# Patient Record
Sex: Female | Born: 1955 | Race: White | Hispanic: No | Smoking: Former smoker
Health system: Southern US, Community
[De-identification: ages and names within clinical notes are randomized; demographics above are authoritative.]

## PROBLEM LIST (undated history)

## (undated) DIAGNOSIS — E079 Disorder of thyroid, unspecified: Secondary | ICD-10-CM

## (undated) DIAGNOSIS — Z8619 Personal history of other infectious and parasitic diseases: Secondary | ICD-10-CM

## (undated) DIAGNOSIS — F32A Depression, unspecified: Secondary | ICD-10-CM

## (undated) DIAGNOSIS — K5792 Diverticulitis of intestine, part unspecified, without perforation or abscess without bleeding: Secondary | ICD-10-CM

## (undated) DIAGNOSIS — F329 Major depressive disorder, single episode, unspecified: Secondary | ICD-10-CM

## (undated) DIAGNOSIS — N390 Urinary tract infection, site not specified: Secondary | ICD-10-CM

## (undated) DIAGNOSIS — F419 Anxiety disorder, unspecified: Secondary | ICD-10-CM

## (undated) DIAGNOSIS — K2270 Barrett's esophagus without dysplasia: Secondary | ICD-10-CM

## (undated) HISTORY — PX: TUBAL LIGATION: SHX77

## (undated) HISTORY — DX: Depression, unspecified: F32.A

## (undated) HISTORY — DX: Personal history of other infectious and parasitic diseases: Z86.19

## (undated) HISTORY — DX: Major depressive disorder, single episode, unspecified: F32.9

## (undated) HISTORY — DX: Diverticulitis of intestine, part unspecified, without perforation or abscess without bleeding: K57.92

## (undated) HISTORY — DX: Disorder of thyroid, unspecified: E07.9

## (undated) HISTORY — DX: Urinary tract infection, site not specified: N39.0

## (undated) HISTORY — DX: Anxiety disorder, unspecified: F41.9

## (undated) HISTORY — DX: Barrett's esophagus without dysplasia: K22.70

---

## 2015-09-20 LAB — HM MAMMOGRAPHY

## 2017-03-07 ENCOUNTER — Other Ambulatory Visit: Payer: Self-pay | Admitting: Physician Assistant

## 2017-03-07 DIAGNOSIS — Z1239 Encounter for other screening for malignant neoplasm of breast: Secondary | ICD-10-CM

## 2017-03-07 DIAGNOSIS — K219 Gastro-esophageal reflux disease without esophagitis: Secondary | ICD-10-CM | POA: Insufficient documentation

## 2017-03-07 DIAGNOSIS — E039 Hypothyroidism, unspecified: Secondary | ICD-10-CM | POA: Insufficient documentation

## 2018-02-06 ENCOUNTER — Other Ambulatory Visit: Payer: Self-pay

## 2018-02-06 ENCOUNTER — Encounter (INDEPENDENT_AMBULATORY_CARE_PROVIDER_SITE_OTHER): Payer: Self-pay

## 2018-02-06 ENCOUNTER — Encounter: Payer: Self-pay | Admitting: Psychiatry

## 2018-02-06 ENCOUNTER — Ambulatory Visit (INDEPENDENT_AMBULATORY_CARE_PROVIDER_SITE_OTHER): Payer: BLUE CROSS/BLUE SHIELD | Admitting: Psychiatry

## 2018-02-06 VITALS — BP 111/73 | HR 80 | Temp 99.2°F | Ht 64.96 in | Wt 161.2 lb

## 2018-02-06 DIAGNOSIS — F17200 Nicotine dependence, unspecified, uncomplicated: Secondary | ICD-10-CM

## 2018-02-06 DIAGNOSIS — F419 Anxiety disorder, unspecified: Secondary | ICD-10-CM

## 2018-02-06 DIAGNOSIS — F330 Major depressive disorder, recurrent, mild: Secondary | ICD-10-CM

## 2018-02-06 MED ORDER — ESCITALOPRAM OXALATE 5 MG PO TABS: 5.0000 mg | ORAL_TABLET | Freq: Every day | ORAL | 1 refills | Status: DC

## 2018-02-06 NOTE — Progress Notes (Signed)
Psychiatric Initial Adult Assessment   Patient Identification: Maureen Pena MRN:  [id number] Date of Evaluation:  02/06/2018 Referral Source: Maureen Cradle MD Chief Complaint:  ' I am here to establish care." Chief Complaint    Establish Care; Anxiety; Depression; Insomnia     Visit Diagnosis:    ICD-10-CM   1. MDD (major depressive disorder), recurrent episode, mild ([place]) F33.0   2. Anxiety disorder, unspecified type F41.9   3. Tobacco use disorder F17.200     History of Present Illness:  Maureen Pena is a 62 yr old Caucasian female, employed, divorced, lives in [place] who has a history of depression, hypothyroidism, presented to the clinic today to establish care.  Patient today reports that a few weeks ago she started noticing some depressive symptoms like sadness, lack of motivation, low energy, sleep problems, reduced appetite, trouble concentrating and so on.  Patient reports it lasted for a few weeks and now she feels her symptoms are getting better.  She reports she tried getting an appointment with a psychiatrist at that time however could not get in to see anyone soon and hence went to [facility] walk-in clinic.  Patient reports she was not very happy with [facility] .  She reports she was prescribed hydroxyzine as needed for anxiety symptoms and was started on Lexapro.  Patient reports she did not take the Lexapro since she was already feeling better and felt like she had the coping skills to pull herself out of her depressive episode.  Patient also reports some anxiety attacks a few weeks ago.  She reports there were times when she felt extremely anxious, restless and panicky.  She reports those symptoms would last for a few minutes .  Reports she has been taking the hydroxyzine as needed and she likes the effect of the hydroxyzine.  Patient reports she struggled with sleep a few weeks ago when she went through her depressive episode.  She however reports her sleep is  getting better.  Patient reports a past history of similar episode in 2012 when she went through the same symptoms of sadness, reduced appetite, weight loss, trouble concentrating and so on.  Patient reports at that time she was treated by a psychiatrist in [place] who gave her Lexapro 10 mg.  She was maintained on Lexapro for a year and she did well on the same.  Patient denies any suicidality.  Patient denies any perceptual disturbances.  Patient denies any homicidality.  Patient denies any manic or hypomanic symptoms.  She however reports she is at baseline and upbeat person.  Patient reports several psychosocial stressors recently.  She reports that she went through some work-related stressors, diverticulitis, financial stressors, relationship struggles with her brother and so on.  Patient reports her recent stressors may have triggered her most recent depressive episode.  She reports she and her brother have grown apart and the relationship have not been good since the past 40 years or so.  Patient however reports that few weeks ago he reached out to her in a text message.  She reports she learned that he is ill and is dying. She reports her best friend also died from the same diagnosis and that kind of brought back a lot of memories of her friend who passed away 2 years ago.  She reports she has not gotten a chance to grieve her friend's death yet since she continues to be in denial.  She reports recreational use of cannabis.  She reports she uses it once every  3 months or so.  Associated Signs/Symptoms: Depression Symptoms:  depressed mood, fatigue, difficulty concentrating, decreased appetite, (Hypo) Manic Symptoms:  denies Anxiety Symptoms:  Excessive Worry, Psychotic Symptoms:  denies PTSD Symptoms: Negative  Past Psychiatric History: Reports she was diagnosed with major depressive disorder in 2012.  She was treated by a psychiatrist in [place].  She reports she stayed on Lexapro  10 mg for 1 year at that time.  Denies any inpatient admissions.  She denies any suicide attempts.  Recently was seen at [facility] clinic when she went through her depressive episode few weeks ago.  Previous Psychotropic Medications: Yes Lexapro, hydroxyzine.  Substance Abuse History in the last 12 months:  Yes.  Recreational use of cannabis once every few months.  Consequences of Substance Abuse: Negative  Past Medical History:  Past Medical History:  Diagnosis Date  . Anxiety   . Depression     Past Surgical History:  Procedure Laterality Date  . TUBAL LIGATION      Family Psychiatric History: Patient reports her mother struggled with psychosis and depressive symptoms but she does not know what her diagnosis was.  She also reports her mother had dementia.  Her brother has depression and opioid dependence.  Family History:  Family History  Problem Relation Age of Onset  . Depression Mother   . Anxiety disorder Mother   . Schizophrenia Mother   . Paranoid behavior Mother   . Depression Brother     Social History:   Social History   Socioeconomic History  . Marital status: Divorced    Spouse name: Not on file  . Number of children: 2  . Years of education: Not on file  . Highest education level: Bachelor's degree (e.g., BA, AB, BS)  Occupational History    Comment: part time  Social Needs  . Financial resource strain: Not hard at all  . Food insecurity:    Worry: Never true    Inability: Never true  . Transportation needs:    Medical: No    Non-medical: No  Tobacco Use  . Smoking status: Current Every Day Smoker    Packs/day: 0.50    Types: Cigarettes  . Smokeless tobacco: Never Used  Substance and Sexual Activity  . Alcohol use: Not Currently    Alcohol/week: 1.0 standard drinks    Types: 1 Glasses of wine per week  . Drug use: Yes    Types: Marijuana  . Sexual activity: Not Currently  Lifestyle  . Physical activity:    Days per week: 7 days    Minutes  per session: 20 min  . Stress: To some extent  Relationships  . Social connections:    Talks on phone: More than three times a week    Gets together: Three times a week    Attends religious service: Never    Active member of club or organization: No    Attends meetings of clubs or organizations: Never    Relationship status: Divorced  Other Topics Concern  . Not on file  Social History Narrative  . Not on file    Additional Social History: Patient is divorced.  She lives in [place].  She moved to [place] from [place] a year ago.  Patient has 2 adult daughters.  She is employed as a Chief Executive Officer.  Patient reports she was raised by her parents.  She does have one brother with whom she does not have a great relationship with.  Both her parents passed away.  Allergies:  No Known Allergies  Metabolic Disorder Labs: No results found for: HGBA1C, MPG No results found for: PROLACTIN No results found for: CHOL, TRIG, HDL, CHOLHDL, VLDL, LDLCALC   Current Medications: Current Outpatient Medications  Medication Sig Dispense Refill  . hydrOXYzine (ATARAX/VISTARIL) 25 MG tablet TAKE 1 TABLET BY MOUTH AT BEDTIME AS NEEDED FOR ANXIETY  0  . levothyroxine (SYNTHROID, LEVOTHROID) 112 MCG tablet Take by mouth.    . Omega-3 1000 MG CAPS Take by mouth.    . escitalopram (LEXAPRO) 5 MG tablet Take 1 tablet (5 mg total) by mouth daily. 30 tablet 1   No current facility-administered medications for this visit.     Neurologic: Headache: No Seizure: No Paresthesias:No  Musculoskeletal: Strength & Muscle Tone: within normal limits Gait & Station: normal Patient leans: N/A  Psychiatric Specialty Exam: Review of Systems  Psychiatric/Behavioral: Positive for depression. The patient is nervous/anxious.   All other systems reviewed and are negative.   Blood pressure 111/73, pulse 80, temperature 99.2 F (37.3 C), temperature source Oral, height 5' 4.96" (1.65 m), weight 161 lb 3.2 oz  (73.1 kg).Body mass index is 26.86 kg/m.  General Appearance: Casual  Eye Contact:  Fair  Speech:  Normal Rate  Volume:  Normal  Mood:  Anxious and Dysphoric  Affect:  Appropriate  Thought Process:  Goal Directed and Descriptions of Associations: Intact  Orientation:  Full (Time, Place, and Person)  Thought Content:  Logical  Suicidal Thoughts:  No  Homicidal Thoughts:  No  Memory:  Immediate;   Fair Recent;   Fair Remote;   Fair  Judgement:  Fair  Insight:  Fair  Psychomotor Activity:  Normal  Concentration:  Concentration: Fair and Attention Span: Fair  Recall:  [employer] of Knowledge:Fair  Language: Fair  Akathisia:  No  Handed:  Right  AIMS (if indicated):  na  Assets:  Communication Skills Desire for Improvement Social Support  ADL's:  Intact  Cognition: WNL  Sleep: improving    Treatment Plan Summary:Milda is a 62 year old Caucasian female, divorced, employed, lives in [place], has a history of depression, GERD, hypothyroidism, presented to the clinic establish care.  Patient reports she had another episode of depression a few weeks ago.  She reports she is currently improving.  Patient reports it was difficult for her to find a new provider since the last time she had a depressive episode was in 2012 while she were in [place].  Patient however was restarted on medications when she went to one of the community clinics as a walk-in for an evaluation.  Patient however reports she has been noncompliant with her Lexapro even though she was asked to restart it.  Patient is currently taking hydroxyzine as needed which she reports is helpful.  Patient is biologically predisposed given her family history of mental health problems as well as her own medical problems.  She also has psychosocial stressors of work-related stress, relationship struggles, financial problems and so on.  Patient also reports she has not yet dealt with the grief of losing one of her best friends 2  years ago.  Patient denies any suicidality or homicidality at this time.  Patient is motivated to start medications as well as psychotherapy.  Plan as noted below. Medication management and Plan as noted below Plan For MDD PHQ 9 equals 10 Restart Lexapro at a low dose of 5 mg daily.  Patient reports she has supplies at home. Will refer her for psychotherapy.  For anxiety disorder unspecified  Patient reports history of anxiety attacks GAD 7 equals 12. Hydroxyzine as prescribed as needed. Lexapro 5 mg p.o. Daily.  Tobacco use disorder Provided smoking cessation counseling.  Reviewed TSH in [facility] on 03/07/2017-within normal limits.  Patient reports she will follow-up with her PMD.  Follow-up in clinic in 4 weeks or sooner if needed.  More than 50 % of the time was spent for psychoeducation and supportive psychotherapy and care coordination.  This note was generated in part or whole with voice recognition software. Voice recognition is usually quite accurate but there are transcription errors that can and very often do occur. I apologize for any typographical errors that were not detected and corrected.        Ursula Alert, MD 8/29/20195:24 PM

## 2018-02-06 NOTE — Patient Instructions (Signed)
Maureen Pena body scan meditation  Maureen Pena

## 2018-02-11 ENCOUNTER — Telehealth: Payer: Self-pay

## 2018-02-11 NOTE — Telephone Encounter (Signed)
Copied from [place] [id number]. Topic: Appointment Scheduling - New Patient >> Feb 11, 2018 10:59 AM Mylinda Latina, NT wrote: New patient has been scheduled for your office. Provider: Dr.McLean Date of Appointment: 02/17/18  Route to department's PEC pool.

## 2018-02-17 ENCOUNTER — Ambulatory Visit: Payer: BLUE CROSS/BLUE SHIELD | Admitting: Internal Medicine

## 2018-02-17 ENCOUNTER — Ambulatory Visit: Payer: BLUE CROSS/BLUE SHIELD | Admitting: Licensed Clinical Social Worker

## 2018-02-17 ENCOUNTER — Encounter: Payer: Self-pay | Admitting: Internal Medicine

## 2018-02-17 VITALS — BP 102/62 | HR 75 | Temp 98.5°F | Ht 64.96 in | Wt 158.4 lb

## 2018-02-17 DIAGNOSIS — Z1283 Encounter for screening for malignant neoplasm of skin: Secondary | ICD-10-CM

## 2018-02-17 DIAGNOSIS — L989 Disorder of the skin and subcutaneous tissue, unspecified: Secondary | ICD-10-CM

## 2018-02-17 DIAGNOSIS — Z13818 Encounter for screening for other digestive system disorders: Secondary | ICD-10-CM

## 2018-02-17 DIAGNOSIS — Z0000 Encounter for general adult medical examination without abnormal findings: Secondary | ICD-10-CM

## 2018-02-17 DIAGNOSIS — K2270 Barrett's esophagus without dysplasia: Secondary | ICD-10-CM | POA: Insufficient documentation

## 2018-02-17 DIAGNOSIS — E559 Vitamin D deficiency, unspecified: Secondary | ICD-10-CM

## 2018-02-17 DIAGNOSIS — E039 Hypothyroidism, unspecified: Secondary | ICD-10-CM | POA: Diagnosis not present

## 2018-02-17 DIAGNOSIS — F39 Unspecified mood [affective] disorder: Secondary | ICD-10-CM | POA: Insufficient documentation

## 2018-02-17 DIAGNOSIS — F329 Major depressive disorder, single episode, unspecified: Secondary | ICD-10-CM

## 2018-02-17 DIAGNOSIS — F419 Anxiety disorder, unspecified: Secondary | ICD-10-CM

## 2018-02-17 DIAGNOSIS — F330 Major depressive disorder, recurrent, mild: Secondary | ICD-10-CM | POA: Diagnosis not present

## 2018-02-17 DIAGNOSIS — E785 Hyperlipidemia, unspecified: Secondary | ICD-10-CM

## 2018-02-17 DIAGNOSIS — F32A Depression, unspecified: Secondary | ICD-10-CM | POA: Insufficient documentation

## 2018-02-17 DIAGNOSIS — Z1159 Encounter for screening for other viral diseases: Secondary | ICD-10-CM

## 2018-02-17 DIAGNOSIS — Z1389 Encounter for screening for other disorder: Secondary | ICD-10-CM

## 2018-02-17 MED ORDER — LEVOTHYROXINE SODIUM 112 MCG PO TABS: 112.0000 ug | ORAL_TABLET | Freq: Every day | ORAL | 1 refills | Status: DC
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
## 2018-02-17 NOTE — Progress Notes (Signed)
Pre visit review using our clinic review tool, if applicable. No additional management support is needed unless otherwise documented below in the visit note. 

## 2018-02-17 NOTE — Progress Notes (Addendum)
No chief complaint on file.  New patient  1. Hypothyroidism needs refill of thyroid medication levo 112  2. Anxiety and depression f/u psychiatry on lexapro 5 mg qd and atarax sees Dr. Shea Evans and therapist visit today  3. Tobacco abuse smoking 10-12 cig was 3-4 currently thinking about quitting stress is trigger  4. H/o barretts due to see GI but declines seeing [facility] for now and wants referral elsewhere  5. C/o skin lesion left shin and left cheek agreeable to see dermatology   Review of Systems  Constitutional: Negative for weight loss.  HENT: Negative for hearing loss.   Eyes: Negative for blurred vision.  Respiratory: Negative for shortness of breath.   Cardiovascular: Negative for chest pain.  Gastrointestinal: Negative for abdominal pain.  Musculoskeletal: Positive for joint pain.       B/l hip pain   Skin: Negative for rash.       +skin lesion   Neurological: Negative for headaches.  Psychiatric/Behavioral: Positive for depression. The patient is nervous/anxious.    Past Medical History:  Diagnosis Date  . Anxiety   . Depression    Past Surgical History:  Procedure Laterality Date  . TUBAL LIGATION     Family History  Problem Relation Age of Onset  . Depression Mother   . Anxiety disorder Mother   . Schizophrenia Mother   . Paranoid behavior Mother   . Depression Brother    Social History   Socioeconomic History  . Marital status: Divorced    Spouse name: Not on file  . Number of children: 2  . Years of education: Not on file  . Highest education level: Bachelor's degree (e.g., BA, AB, BS)  Occupational History    Comment: part time  Social Needs  . Financial resource strain: Not hard at all  . Food insecurity:    Worry: Never true    Inability: Never true  . Transportation needs:    Medical: No    Non-medical: No  Tobacco Use  . Smoking status: Current Every Day Smoker    Packs/day: 0.50    Types: Cigarettes  . Smokeless tobacco: Never Used    Substance and Sexual Activity  . Alcohol use: Not Currently    Alcohol/week: 1.0 standard drinks    Types: 1 Glasses of wine per week  . Drug use: Yes    Types: Marijuana  . Sexual activity: Not Currently  Lifestyle  . Physical activity:    Days per week: 7 days    Minutes per session: 20 min  . Stress: To some extent  Relationships  . Social connections:    Talks on phone: More than three times a week    Gets together: Three times a week    Attends religious service: Never    Active member of club or organization: No    Attends meetings of clubs or organizations: Never    Relationship status: Divorced  . Intimate partner violence:    Fear of current or ex partner: No    Emotionally abused: No    Physically abused: No    Forced sexual activity: No  Other Topics Concern  . Not on file  Social History Narrative  . Not on file   No outpatient medications have been marked as taking for the 02/17/18 encounter (Appointment) with McLean-Scocuzza, Nino Glow, MD.   No Known Allergies No results found for this or any previous visit (from the past 2160 hour(s)). Objective  There is no height  or weight on file to calculate BMI. Wt Readings from Last 3 Encounters:  No data found for Wt   Temp Readings from Last 3 Encounters:  No data found for Temp   BP Readings from Last 3 Encounters:  No data found for BP   Pulse Readings from Last 3 Encounters:  No data found for Pulse    Physical Exam  Constitutional: She is oriented to person, place, and time. Vital signs are normal. She appears well-developed and well-nourished. She is cooperative.  HENT:  Head: Normocephalic and atraumatic.  Mouth/Throat: Oropharynx is clear and moist and mucous membranes are normal.  Eyes: Pupils are equal, round, and reactive to light. Conjunctivae are normal.  Cardiovascular: Normal rate, regular rhythm and normal heart sounds.  Pulmonary/Chest: Effort normal and breath sounds normal.   Neurological: She is alert and oriented to person, place, and time. Gait normal.  Skin: Skin is warm and dry.     Psychiatric: She has a normal mood and affect. Her speech is normal and behavior is normal. Judgment and thought content normal. Cognition and memory are normal.  Nursing note and vitals reviewed.   Assessment   1. Anxiety/depression  F/u with Dr. Shea Evans PHQ 9 score 11 and GAD 7 score 9 today  2. Hypothyroidism  3. barretts esophagus  4. HM 5. HLD  Plan  1. Cont lexapro 5 mg qd and atarax 25 prn F/u psych and therapy    2. Refilled levo 112 today  Wanted to increase to 125 pt declines 3. Pt wants to think about GI referral disc Dr. Orson Gear  rec smoking cessation  4.  Will think about flu and pna 23 vx  Per pt had Tdap 2015 will verify (pt to bring in old records)  Disc shingrix today  Declines MMR and hep B check   Will think about mammogram at f/u need to get copy [place] requested today  Pap at f/u h/o abnormal x 1  Colonoscopy had 08/27/11 and EGD due repeat h/o barretts rec smoking cessation smoking 10-12 cig/qd no FH lung cancer never smoked more than this  5. Given cholesterol info at f/u fasting labs  provider: Dr. Olivia Mackie McLean-Scocuzza-[facility]

## 2018-02-17 NOTE — Patient Instructions (Addendum)
Think about flu shot and pneumonia vaccine  Think about Shingrix vaccine  Think about mammogram and EGD consider Dr. Jonathon Bellows or Dr. Oren Beckmann GI   Pneumococcal Vaccine, Polyvalent solution for injection What is this medicine? PNEUMOCOCCAL VACCINE, POLYVALENT (NEU mo KOK al vak SEEN, pol ee VEY luhnt) is a vaccine to prevent pneumococcus bacteria infection. These bacteria are a major cause of ear infections, Strep throat infections, and serious pneumonia, meningitis, or blood infections worldwide. These vaccines help the body to produce antibodies (protective substances) that help your body defend against these bacteria. This vaccine is recommended for people 7 years of age and older with health problems. It is also recommended for all adults over 34 years old. This vaccine will not treat an infection. This medicine may be used for other purposes; ask your health care provider or pharmacist if you have questions. COMMON BRAND NAME(S): Pneumovax 23 What should I tell my health care provider before I take this medicine? They need to know if you have any of these conditions: -bleeding problems -bone marrow or organ transplant -cancer, Hodgkin's disease -fever -infection -immune system problems -low platelet count in the blood -seizures -an unusual or allergic reaction to pneumococcal vaccine, diphtheria toxoid, other vaccines, latex, other medicines, foods, dyes, or preservatives -pregnant or trying to get pregnant -breast-feeding How should I use this medicine? This vaccine is for injection into a muscle or under the skin. It is given by a health care professional. A copy of Vaccine Information Statements will be given before each vaccination. Read this sheet carefully each time. The sheet may change frequently. Talk to your pediatrician regarding the use of this medicine in children. While this drug may be prescribed for children as young as 20 years of age for selected conditions,  precautions do apply. Overdosage: If you think you have taken too much of this medicine contact a poison control center or emergency room at once. NOTE: This medicine is only for you. Do not share this medicine with others. What if I miss a dose? It is important not to miss your dose. Call your doctor or health care professional if you are unable to keep an appointment. What may interact with this medicine? -medicines for cancer chemotherapy -medicines that suppress your immune function -medicines that treat or prevent blood clots like warfarin, enoxaparin, and dalteparin -steroid medicines like prednisone or cortisone This list may not describe all possible interactions. Give your health care provider a list of all the medicines, herbs, non-prescription drugs, or dietary supplements you use. Also tell them if you smoke, drink alcohol, or use illegal drugs. Some items may interact with your medicine. What should I watch for while using this medicine? Mild fever and pain should go away in 3 days or less. Report any unusual symptoms to your doctor or health care professional. What side effects may I notice from receiving this medicine? Side effects that you should report to your doctor or health care professional as soon as possible: -allergic reactions like skin rash, itching or hives, swelling of the face, lips, or tongue -breathing problems -confused -fever over 102 degrees F -pain, tingling, numbness in the hands or feet -seizures -unusual bleeding or bruising -unusual muscle weakness Side effects that usually do not require medical attention (report to your doctor or health care professional if they continue or are bothersome): -aches and pains -diarrhea -fever of 102 degrees F or less -headache -irritable -loss of appetite -pain, tender at site where injected -trouble sleeping  This list may not describe all possible side effects. Call your doctor for medical advice about side  effects. You may report side effects to FDA at 1-800-FDA-1088. Where should I keep my medicine? This does not apply. This vaccine is given in a clinic, pharmacy, doctor's office, or other health care setting and will not be stored at home. NOTE: This sheet is a summary. It may not cover all possible information. If you have questions about this medicine, talk to your doctor, pharmacist, or health care provider.  2018 Elsevier/Gold Standard (2008-01-02 14:32:37)  Recombinant Zoster (Shingles) Vaccine, RZV: What You Need to Know 1. Why get vaccinated? Shingles (also called herpes zoster, or just zoster) is a painful skin rash, often with blisters. Shingles is caused by the varicella zoster virus, the same virus that causes chickenpox. After you have chickenpox, the virus stays in your body and can cause shingles later in life. You can't catch shingles from another person. However, a person who has never had chickenpox (or chickenpox vaccine) could get chickenpox from someone with shingles. A shingles rash usually appears on one side of the face or body and heals within 2 to 4 weeks. Its main symptom is pain, which can be severe. Other symptoms can include fever, headache, chills and upset stomach. Very rarely, a shingles infection can lead to pneumonia, hearing problems, blindness, brain inflammation (encephalitis), or death. For about 1 person in 5, severe pain can continue even long after the rash has cleared up. This long-lasting pain is called post-herpetic neuralgia (PHN). Shingles is far more common in people 39 years of age and older than in younger people, and the risk increases with age. It is also more common in people whose immune system is weakened because of a disease such as cancer, or by drugs such as steroids or chemotherapy. At least 1 million people a year in the [place] get shingles. 2. Shingles vaccine (recombinant) Recombinant shingles vaccine was approved by FDA in 2017 for  the prevention of shingles. In clinical trials, it was more than 90% effective in preventing shingles. It can also reduce the likelihood of PHN. Two doses, 2 to 6 months apart, are recommended for adults 12 and older. This vaccine is also recommended for people who have already gotten the live shingles vaccine (Zostavax). There is no live virus in this vaccine. 3. Some people should not get this vaccine Tell your vaccine provider if you:  Have any severe, life-threatening allergies. A person who has ever had a life-threatening allergic reaction after a dose of recombinant shingles vaccine, or has a severe allergy to any component of this vaccine, may be advised not to be vaccinated. Ask your health care provider if you want information about vaccine components.  Are pregnant or breastfeeding. There is not much information about use of recombinant shingles vaccine in pregnant or nursing women. Your healthcare provider might recommend delaying vaccination.  Are not feeling well. If you have a mild illness, such as a cold, you can probably get the vaccine today. If you are moderately or severely ill, you should probably wait until you recover. Your doctor can advise you.  4. Risks of a vaccine reaction With any medicine, including vaccines, there is a chance of reactions. After recombinant shingles vaccination, a person might experience:  Pain, redness, soreness, or swelling at the site of the injection  Headache, muscle aches, fever, shivering, fatigue  In clinical trials, most people got a sore arm with mild or moderate pain  after vaccination, and some also had redness and swelling where they got the shot. Some people felt tired, had muscle pain, a headache, shivering, fever, stomach pain, or nausea. About 1 out of 6 people who got recombinant zoster vaccine experienced side effects that prevented them from doing regular activities. Symptoms went away on their own in about 2 to 3 days. Side  effects were more common in younger people. You should still get the second dose of recombinant zoster vaccine even if you had one of these reactions after the first dose. Other things that could happen after this vaccine:  People sometimes faint after medical procedures, including vaccination. Sitting or lying down for about 15 minutes can help prevent fainting and injuries caused by a fall. Tell your provider if you feel dizzy or have vision changes or ringing in the ears.  Some people get shoulder pain that can be more severe and longer-lasting than routine soreness that can follow injections. This happens very rarely.  Any medication can cause a severe allergic reaction. Such reactions to a vaccine are estimated at about 1 in a million doses, and would happen within a few minutes to a few hours after the vaccination. As with any medicine, there is a very remote chance of a vaccine causing a serious injury or death. The safety of vaccines is always being monitored. For more information, visit: http://www.aguilar.org/ 5. What if there is a serious problem? What should I look for?  Look for anything that concerns you, such as signs of a severe allergic reaction, very high fever, or unusual behavior. Signs of a severe allergic reaction can include hives, swelling of the face and throat, difficulty breathing, a fast heartbeat, dizziness, and weakness. These would usually start a few minutes to a few hours after the vaccination. What should I do?  If you think it is a severe allergic reaction or other emergency that can't wait, call 9-1-1 and get to the nearest hospital. Otherwise, call your health care provider. Afterward, the reaction should be reported to the Vaccine Adverse Event Reporting System (VAERS). Your doctor should file this report, or you can do it yourself through the VAERS web site atwww.vaers.https://www.bray.com/ by calling 914-726-8272. VAERS does not give medical advice. 6. How can I  learn more?  Ask your healthcare provider. He or she can give you the vaccine package insert or suggest other sources of information.  Call your local or state health department.  Contact the Centers for Disease Control and Prevention (CDC): ? Call 878-816-1777 (1-800-CDC-INFO) or ? Visit the CDC's website at http://hunter.com/ CDC Vaccine Information Statement (VIS) Recombinant Zoster Vaccine (07/23/2016) This information is not intended to replace advice given to you by your health care provider. Make sure you discuss any questions you have with your health care provider. Document Released: 08/07/2016 Document Revised: 08/07/2016 Document Reviewed: 08/07/2016 Elsevier Interactive Patient Education  2018 [place].   Seborrheic Keratosis Seborrheic keratosis is a common, noncancerous (benign) skin growth. This condition causes waxy, rough, tan, brown, or black spots to appear on the skin. These skin growths can be flat or raised. What are the causes? The cause of this condition is not known. What increases the risk? This condition is more likely to develop in:  People who have a family history of seborrheic keratosis.  People who are 71 or older.  People who are pregnant.  People who have had estrogen replacement therapy.  What are the signs or symptoms? This condition often occurs on the  face, chest, shoulders, back, or other areas. These growths:  Are usually painless, but may become irritated and itchy.  Can be yellow, brown, black, or other colors.  Are slightly raised or have a flat surface.  Are sometimes rough or wart-like in texture.  Are often waxy on the surface.  Are round or oval-shaped.  Sometimes look like they are "stuck on."  Often occur in groups, but may occur as a single growth.  How is this diagnosed? This condition is diagnosed with a medical history and physical exam. A sample of the growth may be tested (skin biopsy). You may need to  see a skin specialist (dermatologist). How is this treated? Treatment is not usually needed for this condition, unless the growths are irritated or are often bleeding. You may also choose to have the growths removed if you do not like their appearance. Most commonly, these growths are treated with a procedure in which liquid nitrogen is applied to "freeze" off the growth (cryosurgery). They may also be burned off with electricity or cut off. Follow these instructions at home:  Watch your growth for any changes.  Keep all follow-up visits as told by your health care provider. This is important.  Do not scratch or pick at the growth or growths. This can cause them to become irritated or infected. Contact a health care provider if:  You suddenly have many new growths.  Your growth bleeds, itches, or hurts.  Your growth suddenly becomes larger or changes color. This information is not intended to replace advice given to you by your health care provider. Make sure you discuss any questions you have with your health care provider. Document Released: 06/30/2010 Document Revised: 11/03/2015 Document Reviewed: 10/13/2014 Elsevier Interactive Patient Education  2018 [employer].

## 2018-03-05 ENCOUNTER — Telehealth: Payer: Self-pay

## 2018-03-05 ENCOUNTER — Other Ambulatory Visit: Payer: Self-pay | Admitting: Internal Medicine

## 2018-03-05 DIAGNOSIS — K2270 Barrett's esophagus without dysplasia: Secondary | ICD-10-CM

## 2018-03-05 DIAGNOSIS — Z1211 Encounter for screening for malignant neoplasm of colon: Secondary | ICD-10-CM

## 2018-03-05 NOTE — Telephone Encounter (Signed)
Copied from [place] [phone]. Topic: Referral - Request >> Mar 05, 2018  9:59 AM Yvette Rack wrote: Reason for CRM: pt calling stating that Dr Jacklynn Lewis referred her to a gastro provider DR Allen Norris in [place] but they advised her that she would need a referral please set that up and give pt a call back at [phone]

## 2018-03-10 ENCOUNTER — Ambulatory Visit: Payer: BLUE CROSS/BLUE SHIELD | Admitting: Licensed Clinical Social Worker

## 2018-03-10 ENCOUNTER — Other Ambulatory Visit: Payer: BLUE CROSS/BLUE SHIELD

## 2018-03-10 ENCOUNTER — Other Ambulatory Visit: Payer: Self-pay

## 2018-03-10 ENCOUNTER — Ambulatory Visit: Payer: BLUE CROSS/BLUE SHIELD | Admitting: Psychiatry

## 2018-03-10 ENCOUNTER — Encounter: Payer: Self-pay | Admitting: Psychiatry

## 2018-03-10 VITALS — BP 124/80 | HR 70 | Temp 99.6°F | Ht 66.0 in | Wt 159.4 lb

## 2018-03-10 DIAGNOSIS — F419 Anxiety disorder, unspecified: Secondary | ICD-10-CM | POA: Diagnosis not present

## 2018-03-10 DIAGNOSIS — Z1389 Encounter for screening for other disorder: Secondary | ICD-10-CM

## 2018-03-10 DIAGNOSIS — E039 Hypothyroidism, unspecified: Secondary | ICD-10-CM

## 2018-03-10 DIAGNOSIS — F17200 Nicotine dependence, unspecified, uncomplicated: Secondary | ICD-10-CM

## 2018-03-10 DIAGNOSIS — F330 Major depressive disorder, recurrent, mild: Secondary | ICD-10-CM | POA: Diagnosis not present

## 2018-03-10 DIAGNOSIS — Z0000 Encounter for general adult medical examination without abnormal findings: Secondary | ICD-10-CM

## 2018-03-10 DIAGNOSIS — Z1159 Encounter for screening for other viral diseases: Secondary | ICD-10-CM

## 2018-03-10 DIAGNOSIS — E559 Vitamin D deficiency, unspecified: Secondary | ICD-10-CM

## 2018-03-10 DIAGNOSIS — E785 Hyperlipidemia, unspecified: Secondary | ICD-10-CM

## 2018-03-10 DIAGNOSIS — Z13818 Encounter for screening for other digestive system disorders: Secondary | ICD-10-CM

## 2018-03-10 MED ORDER — ESCITALOPRAM OXALATE 10 MG PO TABS: 10.0000 mg | ORAL_TABLET | Freq: Every day | ORAL | 0 refills | Status: DC

## 2018-03-10 NOTE — Addendum Note (Signed)
Addended by: Arby Barrette on: 03/10/2018 08:04 AM   Modules accepted: Orders

## 2018-03-10 NOTE — Progress Notes (Signed)
[facility] MD OP Progress Note  03/10/2018 5:32 PM Maureen Pena  MRN:  [id number]  Chief Complaint: ' I am here for follow up.' Chief Complaint    Follow-up; Medication Refill     HPI: Maureen Pena is a 62 year old Caucasian female, employed, divorced, lives in [place], has a history of depression, hypothyroidism, presented to the clinic today for a follow-up visit.  Patient today reports her mood symptoms as making progress.  She reports her low energy and appetite changes have improved.  She reports she is tolerating the Lexapro well.  She however reports she used to take 10 mg in the past and would like her dosage to be increased.  She does have GI problems which are chronic.  She has upcoming appointment with the GI specialist in October.  She struggles with GI pain.  She however reports she does not think that has anything to do with her Lexapro and would like her dosage to be increased.  Patient reports sleep as fair.  Patient continues to have some on and off anxiety symptoms when she wakes up in the morning with some nervousness and restlessness.  She does take hydroxyzine as needed which she reports helps her.  She however takes it at bedtime.  She reports she continues to struggle with some grief from the loss of her best friend.  Patient has been referred for psychotherapy and has upcoming appointment scheduled.  She reports she looks forward to the same.  Patient denies any suicidality, homicidality or perceptual disturbances.  Patient reports work is going well. Visit Diagnosis:    ICD-10-CM   1. MDD (major depressive disorder), recurrent episode, mild ([place]) F33.0   2. Anxiety disorder, unspecified type F41.9   3. Tobacco use disorder F17.200     Past Psychiatric History: Reviewed past psychiatric history from my progress note on 02/06/2018.  Past trials of Lexapro, hydroxyzine P  Past Medical History:  Past Medical History:  Diagnosis Date  . Anxiety   . Barrett's  esophagus   . Depression   . Diverticulitis   . History of chicken pox   . Thyroid disease    hypothyroidism  . UTI (urinary tract infection)     Past Surgical History:  Procedure Laterality Date  . TUBAL LIGATION      Family Psychiatric History: Reviewed family psychiatric history from my progress note on 02/06/2018.  Family History:  Family History  Problem Relation Age of Onset  . Depression Mother   . Anxiety disorder Mother   . Schizophrenia Mother   . Paranoid behavior Mother   . Stroke Mother   . COPD Father   . Early death Father   . Hearing loss Father   . Heart disease Father   . Depression Brother   . COPD Brother   . Hypertension Brother   . Kidney disease Brother     Social History: I have reviewed social history from my progress note on 02/06/2018 Social History   Socioeconomic History  . Marital status: Divorced    Spouse name: Not on file  . Number of children: 2  . Years of education: Not on file  . Highest education level: Bachelor's degree (e.g., BA, AB, BS)  Occupational History    Comment: part time  Social Needs  . Financial resource strain: Not hard at all  . Food insecurity:    Worry: Never true    Inability: Never true  . Transportation needs:    Medical: No    Non-medical:  No  Tobacco Use  . Smoking status: Current Every Day Smoker    Packs/day: 0.50    Types: Cigarettes  . Smokeless tobacco: Never Used  Substance and Sexual Activity  . Alcohol use: Not Currently    Alcohol/week: 1.0 standard drinks    Types: 1 Glasses of wine per week  . Drug use: Yes    Types: Marijuana  . Sexual activity: Not Currently  Lifestyle  . Physical activity:    Days per week: 7 days    Minutes per session: 20 min  . Stress: To some extent  Relationships  . Social connections:    Talks on phone: More than three times a week    Gets together: Three times a week    Attends religious service: Never    Active member of club or organization: No     Attends meetings of clubs or organizations: Never    Relationship status: Divorced  Other Topics Concern  . 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 hydrOXYzine (ATARAX/VISTARIL) 25 MG tablet TAKE 1 TABLET BY MOUTH AT BEDTIME AS NEEDED FOR ANXIETY  0  . levothyroxine (SYNTHROID, LEVOTHROID) 112 MCG tablet Take 1 tablet (112 mcg total) by mouth daily before breakfast. 30 minutes before breakfast 90 tablet 1  . Omega-3 1000 MG CAPS Take by mouth.    . escitalopram (LEXAPRO) 10 MG tablet Take 1 tablet (10 mg total) by mouth daily. 90 tablet 0   No current facility-administered medications for this visit.      Musculoskeletal: Strength & Muscle Tone: within normal limits Gait & Station: normal Patient leans: N/A  Psychiatric Specialty Exam: Review of Systems  Psychiatric/Behavioral: Positive for depression. The patient is nervous/anxious.   All other systems reviewed and are negative.   Blood pressure 124/80, pulse 70, temperature 99.6 F (37.6 C), temperature source Oral, height 5\' 6"  (1.676 m), weight 159 lb 6.4 oz (72.3 kg).Body mass index is 25.73 kg/m.  General Appearance: Casual  Eye Contact:  Fair  Speech:  Clear and Coherent  Volume:  Normal  Mood:  Depressed  Affect:  Congruent  Thought Process:  Goal Directed and Descriptions of Associations: Intact  Orientation:  Full (Time, Place, and Person)  Thought Content: Logical   Suicidal  Thoughts:  No  Homicidal Thoughts:  No  Memory:  Immediate;   Fair Recent;   Fair Remote;   Fair  Judgement:  Fair  Insight:  Fair  Psychomotor Activity:  Normal  Concentration:  Concentration: Fair and Attention Span: Fair  Recall:  [employer] of Knowledge: Fair  Language: Fair  Akathisia:  No  Handed:  Right  AIMS (if indicated): na  Assets:  Communication Skills Desire for Improvement Social Support  ADL's:  Intact  Cognition: WNL  Sleep:  restless , due to her GI pain   Screenings: GAD-7     Office Visit  from 02/17/2018 in [facility]  Total GAD-7 Score  9    PHQ2-9     Office Visit from 02/17/2018 in [facility]  PHQ-2 Total Score  4  PHQ-9 Total Score  11       Assessment and Plan: Kambrie is a 62 year old Caucasian female, divorced, employed, lives in [place], has a history of depression, GERD, hypothyroidism, presented to the clinic today for a follow-up visit.  Patient reports tolerating her medication well with good benefits.  She is interested in increasing her dosage today.  She will continue to work with her therapist for her mood symptoms as well as grief.  Patient is biologically predisposed given her history of mental health problems as well as medical problems.  She also has psychosocial stressors of work-related stress, relationship struggles, financial problems as well as death of a friend which she has some difficulty coping with.  Will continue plan as noted below.  Plan For MDD Increase Lexapro to 10 mg p.o. daily Continue psychotherapy.  For anxiety disorder unspecified-rule out GAD Hydroxyzine as prescribed Lexapro 10 mg p.o. daily  For tobacco use disorder Provided smoking cessation counseling.  Follow-up in clinic in 4-5 weeks or sooner if needed.  More than 50 % of the time was spent for psychoeducation and supportive psychotherapy and care coordination.  This note was generated in part or whole  with voice recognition software. Voice recognition is usually quite accurate but there are transcription errors that can and very often do occur. I apologize for any typographical errors that were not detected and corrected.        Ursula Alert, MD 03/10/2018, 5:32 PM

## 2018-03-11 ENCOUNTER — Other Ambulatory Visit: Payer: Self-pay | Admitting: Internal Medicine

## 2018-03-11 DIAGNOSIS — E039 Hypothyroidism, unspecified: Secondary | ICD-10-CM

## 2018-03-11 LAB — COMPREHENSIVE METABOLIC PANEL
AG Ratio: 1.9 (calc) (ref 1.0–2.5)
ALBUMIN MSPROF: 4 g/dL (ref 3.6–5.1)
ALKALINE PHOSPHATASE (APISO): 81 U/L (ref 33–130)
ALT: 13 U/L (ref 6–29)
AST: 16 U/L (ref 10–35)
BILIRUBIN TOTAL: 0.4 mg/dL (ref 0.2–1.2)
BUN/Creatinine Ratio: 15 (calc) (ref 6–22)
BUN: 16 mg/dL (ref 7–25)
CALCIUM: 9.5 mg/dL (ref 8.6–10.4)
CHLORIDE: 108 mmol/L (ref 98–110)
CO2: 23 mmol/L (ref 20–32)
Creat: 1.04 mg/dL — ABNORMAL HIGH (ref 0.50–0.99)
GLOBULIN: 2.1 g/dL (ref 1.9–3.7)
Glucose, Bld: 92 mg/dL (ref 65–99)
POTASSIUM: 5 mmol/L (ref 3.5–5.3)
SODIUM: 142 mmol/L (ref 135–146)
TOTAL PROTEIN: 6.1 g/dL (ref 6.1–8.1)

## 2018-03-11 LAB — CBC WITH DIFFERENTIAL/PLATELET
BASOS PCT: 1.2 %
Basophils Absolute: 83 cells/uL (ref 0–200)
EOS PCT: 6.7 %
Eosinophils Absolute: 462 cells/uL (ref 15–500)
HEMATOCRIT: 44.1 % (ref 35.0–45.0)
HEMOGLOBIN: 14.8 g/dL (ref 11.7–15.5)
LYMPHS ABS: 1898 {cells}/uL (ref 850–3900)
MCH: 30.6 pg (ref 27.0–33.0)
MCHC: 33.6 g/dL (ref 32.0–36.0)
MCV: 91.3 fL (ref 80.0–100.0)
MPV: 12.1 fL (ref 7.5–12.5)
Monocytes Relative: 5.5 %
NEUTROS PCT: 59.1 %
Neutro Abs: 4078 cells/uL (ref 1500–7800)
Platelets: 233 10*3/uL (ref 140–400)
RBC: 4.83 10*6/uL (ref 3.80–5.10)
RDW: 13 % (ref 11.0–15.0)
Total Lymphocyte: 27.5 %
WBC: 6.9 10*3/uL (ref 3.8–10.8)
WBCMIX: 380 {cells}/uL (ref 200–950)

## 2018-03-11 LAB — URINALYSIS, ROUTINE W REFLEX MICROSCOPIC
Bilirubin, UA: NEGATIVE
Glucose, UA: NEGATIVE
Ketones, UA: NEGATIVE
LEUKOCYTES UA: NEGATIVE
NITRITE UA: NEGATIVE
Protein, UA: NEGATIVE
RBC, UA: NEGATIVE
SPEC GRAV UA: 1.017 (ref 1.005–1.030)
Urobilinogen, Ur: 0.2 mg/dL (ref 0.2–1.0)
pH, UA: 7 (ref 5.0–7.5)

## 2018-03-11 LAB — LIPID PANEL
CHOL/HDL RATIO: 3.9 (calc) (ref <5.0)
Cholesterol: 217 mg/dL — ABNORMAL HIGH (ref <200)
HDL: 55 mg/dL (ref >50)
LDL CHOLESTEROL (CALC): 141 mg/dL — AB
NON-HDL CHOLESTEROL (CALC): 162 mg/dL — AB (ref <130)
TRIGLYCERIDES: 99 mg/dL (ref <150)

## 2018-03-11 LAB — VITAMIN D 25 HYDROXY (VIT D DEFICIENCY, FRACTURES): Vit D, 25-Hydroxy: 27 ng/mL — ABNORMAL LOW (ref 30–100)

## 2018-03-11 LAB — HEPATITIS C ANTIBODY
Hepatitis C Ab: NONREACTIVE
SIGNAL TO CUT-OFF: 0.05 (ref <1.00)

## 2018-03-11 LAB — TSH: TSH: 5.65 mIU/L — ABNORMAL HIGH (ref 0.40–4.50)

## 2018-03-11 MED ORDER — LEVOTHYROXINE SODIUM 125 MCG PO TABS: 125.0000 ug | ORAL_TABLET | Freq: Every day | ORAL | 1 refills | Status: DC

## 2018-03-17 ENCOUNTER — Ambulatory Visit (INDEPENDENT_AMBULATORY_CARE_PROVIDER_SITE_OTHER): Payer: BLUE CROSS/BLUE SHIELD | Admitting: Licensed Clinical Social Worker

## 2018-03-17 ENCOUNTER — Encounter: Payer: Self-pay | Admitting: Licensed Clinical Social Worker

## 2018-03-17 DIAGNOSIS — F330 Major depressive disorder, recurrent, mild: Secondary | ICD-10-CM

## 2018-03-17 NOTE — Progress Notes (Signed)
   THERAPIST PROGRESS NOTE  Session Time: 1000-1100  Participation Level: Active  Behavioral Response: CasualAlertDepressed  Type of Therapy: Individual Therapy  Treatment Goals addressed: Coping  Interventions: CBT  Summary: Maureen Pena is a 62 y.o. female who presents with symptoms of depression. Maureen Pena reports improvements in her daily life in terms of her depression, as well as improvement in her sleep habits. However, she stated she feels tired more frequently throughout the day--though she is sleeping through the evening better. She reports applying for multiple jobs over the last three weeks, showing improvement in her motivation. She reports struggling with the process of aging, and I encouraged her to keep a mental list of the positive qualities of aging. Additionally, we discussed ways she can remain in the moment by utilizing CBT thought challenging activities.    Suicidal/Homicidal: No   Therapist Response: I asked Maureen Pena to utilize thought challenging techniques over the next two weeks in order to assist managing depressive thoughts associated with aging and "feeling less than." Maureen Pena was in agreement with this plan.   Plan: Return again in 3 weeks.  Diagnosis: Axis I: Major Depression, Recurrent severe    Axis II: No diagnosis    [name], LCSW 03/17/2018

## 2018-03-20 ENCOUNTER — Encounter: Payer: Self-pay | Admitting: Internal Medicine

## 2018-04-03 ENCOUNTER — Other Ambulatory Visit: Payer: Self-pay | Admitting: Physician Assistant

## 2018-04-03 DIAGNOSIS — Z1231 Encounter for screening mammogram for malignant neoplasm of breast: Secondary | ICD-10-CM

## 2018-04-07 ENCOUNTER — Ambulatory Visit: Payer: BLUE CROSS/BLUE SHIELD | Admitting: Licensed Clinical Social Worker

## 2018-04-10 ENCOUNTER — Encounter: Payer: Self-pay | Admitting: Gastroenterology

## 2018-04-10 ENCOUNTER — Other Ambulatory Visit: Payer: Self-pay

## 2018-04-10 ENCOUNTER — Ambulatory Visit: Payer: BLUE CROSS/BLUE SHIELD | Admitting: Gastroenterology

## 2018-04-10 ENCOUNTER — Encounter

## 2018-04-10 VITALS — BP 122/67 | HR 67 | Ht 64.0 in | Wt 164.0 lb

## 2018-04-10 DIAGNOSIS — K5792 Diverticulitis of intestine, part unspecified, without perforation or abscess without bleeding: Secondary | ICD-10-CM | POA: Insufficient documentation

## 2018-04-10 DIAGNOSIS — K2270 Barrett's esophagus without dysplasia: Secondary | ICD-10-CM

## 2018-04-10 DIAGNOSIS — K22719 Barrett's esophagus with dysplasia, unspecified: Secondary | ICD-10-CM | POA: Diagnosis not present

## 2018-04-10 DIAGNOSIS — R109 Unspecified abdominal pain: Secondary | ICD-10-CM | POA: Diagnosis not present

## 2018-04-10 DIAGNOSIS — K219 Gastro-esophageal reflux disease without esophagitis: Secondary | ICD-10-CM | POA: Diagnosis not present

## 2018-04-10 NOTE — Progress Notes (Signed)
Gastroenterology Consultation  Referring Provider:     McLean-Scocuzza, Olivia Mackie * Primary Care Physician:  McLean-Scocuzza, Nino Glow, MD Primary Gastroenterologist:  Dr. Allen Norris     Reason for Consultation:     Barrett's esophagus        HPI:   Maureen Pena is a 62 y.o. y/o female referred for consultation & management of Barrett's esophagus by Dr. Terese Door, Nino Glow, MD.  This patient comes in today after being seen a few months ago by [facility] clinics Dr. Alice Reichert for a history of Barrett's esophagus.  The patient's last EGD was in 2013 in [place].  The patient was seen by a nurse practitioner over at the [facility] clinic office and was recommended to undergo an EGD because of the history of Barrett's esophagus. The patient comes with her photos of her previous endoscopy that do not clearly show her to have Barrett's esophagus despite the biopsy showing goblet cells suggestive of Barrett's esophagus.  The patient also had a colonoscopy in 2013 with diverticulosis.  She reports that she has had multiple episodes of left-sided abdominal pain and she has been treated with Cipro and Flagyl for which she only takes the Flagyl because she states that the Cipro does not agree with her.  The patient also reports that when she took a PPI it made her feel worse than she did before and she stopped taking it.  There is no report of any dysphasia fevers chills nausea vomiting. When the patient has her left lower quadrant pain that she thinks is diverticulitis she states that she is never had a CT scan for this and is just treated with the antibiotic.  She also reports that sometimes it goes away in 1 to 2 days. The patient reports that she has a normal bowel movement in the morning but then has subsequent 2-3 times a day of loose watery bowel movements.  The diarrhea does not wake her up from sleep and she has not lost any weight with this.  The patient reports that she has been under a lot of stress and  had a near nervous breakdown within the last few months.  Past Medical History:  Diagnosis Date  . Anxiety   . Barrett's esophagus   . Depression   . Diverticulitis   . History of chicken pox   . Thyroid disease    hypothyroidism  . UTI (urinary tract infection)     Past Surgical History:  Procedure Laterality Date  . TUBAL LIGATION      Prior to Admission medications   Medication Sig Start Date End Date Taking? Authorizing Provider  escitalopram (LEXAPRO) 10 MG tablet Take 1 tablet (10 mg total) by mouth daily. 03/10/18   Ursula Alert, MD  hydrOXYzine (ATARAX/VISTARIL) 25 MG tablet TAKE 1 TABLET BY MOUTH AT BEDTIME AS NEEDED FOR ANXIETY 01/16/18   [provider]  levothyroxine (SYNTHROID, LEVOTHROID) 125 MCG tablet Take 1 tablet (125 mcg total) by mouth daily before breakfast. 30 minutes before breakfast 03/11/18   McLean-Scocuzza, Nino Glow, MD  Omega-3 1000 MG CAPS Take by mouth.    [provider]    Family History  Problem Relation Age of Onset  . Depression Mother   . Anxiety disorder Mother   . Schizophrenia Mother   . Paranoid behavior Mother   . Stroke Mother   . COPD Father   . Early death Father   . Hearing loss Father   . Heart disease Father   .  Depression Brother   . COPD Brother   . Hypertension Brother   . Kidney disease Brother      Social History   Tobacco Use  . Smoking status: Current Every Day Smoker    Packs/day: 0.50    Types: Cigarettes  . Smokeless tobacco: Never Used  Substance Use Topics  . Alcohol use: Not Currently    Alcohol/week: 1.0 standard drinks    Types: 1 Glasses of wine per week  . Drug use: Yes    Types: Marijuana    Allergies as of 04/10/2018  . (No Known Allergies)    Review of Systems:    All systems reviewed and negative except where noted in HPI.   Physical Exam:  There were no vitals taken for this visit. No LMP recorded. General:   Alert,  Well-developed, well-nourished, pleasant and  cooperative in NAD Head:  Normocephalic and atraumatic. Eyes:  Sclera clear, no icterus.   Conjunctiva pink. Ears:  Normal auditory acuity. Nose:  No deformity, discharge, or lesions. Mouth:  No deformity or lesions,oropharynx pink & moist. Neck:  Supple; no masses or thyromegaly. Lungs:  Respirations even and unlabored.  Clear throughout to auscultation.   No wheezes, crackles, or rhonchi. No acute distress. Heart:  Regular rate and rhythm; no murmurs, clicks, rubs, or gallops. Abdomen:  Normal bowel sounds.  No bruits.  Soft, non-tender and non-distended without masses, hepatosplenomegaly or hernias noted.  No guarding or rebound tenderness.  Negative Carnett sign.   Rectal:  Deferred.  Msk:  Symmetrical without gross deformities.  Good, equal movement & strength bilaterally. Pulses:  Normal pulses noted. Extremities:  No clubbing or edema.  No cyanosis. Neurologic:  Alert and oriented x3;  grossly normal neurologically. Skin:  Intact without significant lesions or rashes.  No jaundice. Lymph Nodes:  No significant cervical adenopathy. Psych:  Alert and cooperative. Normal mood and affect.  Imaging Studies: No results found.  Assessment and Plan:   Maureen Pena is a 62 y.o. y/o female who comes in today with a history of Barrett's esophagus which is questionable when reviewing the photos of her esophagus.  The patient also has a history of diverticulosis and has been told that if she has any further episodes of abdominal pain she should contact our office and be set up for a CT scan of the abdomen to make sure she does or does not have diverticulitis.  She states she has had 2 episodes this year which she thought were diverticulitis.  The patient will also be set up for an EGD because of her history of Barrett's to assess whether or not she does have Barrett's esophagus.  The patient is not due for a colonoscopy at this time.  She also reports that her diarrhea only happens after  her first solid bowel movement of the day and she has tried to decrease dairy products.  The patient has been told to increase fiber in her diet by taking a supplement once a day to see if she can solidify her stools.  The patient will follow-up at the time of the EGD.  Lucilla Lame, MD. Marval Regal    Note: This dictation was prepared with Dragon dictation along with smaller phrase technology. Any transcriptional errors that result from this process are unintentional.

## 2018-04-10 NOTE — H&P (View-Only) (Signed)
Gastroenterology Consultation  Referring Provider:     McLean-Scocuzza, Olivia Mackie * Primary Care Physician:  McLean-Scocuzza, Nino Glow, MD Primary Gastroenterologist:  Dr. Allen Norris     Reason for Consultation:     Barrett's esophagus        HPI:   Maureen Pena is a 62 y.o. y/o female referred for consultation & management of Barrett's esophagus by Dr. Terese Door, Nino Glow, MD.  This patient comes in today after being seen a few months ago by [facility] clinics Dr. Alice Reichert for a history of Barrett's esophagus.  The patient's last EGD was in 2013 in [place].  The patient was seen by a nurse practitioner over at the [facility] clinic office and was recommended to undergo an EGD because of the history of Barrett's esophagus. The patient comes with her photos of her previous endoscopy that do not clearly show her to have Barrett's esophagus despite the biopsy showing goblet cells suggestive of Barrett's esophagus.  The patient also had a colonoscopy in 2013 with diverticulosis.  She reports that she has had multiple episodes of left-sided abdominal pain and she has been treated with Cipro and Flagyl for which she only takes the Flagyl because she states that the Cipro does not agree with her.  The patient also reports that when she took a PPI it made her feel worse than she did before and she stopped taking it.  There is no report of any dysphasia fevers chills nausea vomiting. When the patient has her left lower quadrant pain that she thinks is diverticulitis she states that she is never had a CT scan for this and is just treated with the antibiotic.  She also reports that sometimes it goes away in 1 to 2 days. The patient reports that she has a normal bowel movement in the morning but then has subsequent 2-3 times a day of loose watery bowel movements.  The diarrhea does not wake her up from sleep and she has not lost any weight with this.  The patient reports that she has been under a lot of stress and  had a near nervous breakdown within the last few months.  Past Medical History:  Diagnosis Date  . Anxiety   . Barrett's esophagus   . Depression   . Diverticulitis   . History of chicken pox   . Thyroid disease    hypothyroidism  . UTI (urinary tract infection)     Past Surgical History:  Procedure Laterality Date  . TUBAL LIGATION      Prior to Admission medications   Medication Sig Start Date End Date Taking? Authorizing Provider  escitalopram (LEXAPRO) 10 MG tablet Take 1 tablet (10 mg total) by mouth daily. 03/10/18   Ursula Alert, MD  hydrOXYzine (ATARAX/VISTARIL) 25 MG tablet TAKE 1 TABLET BY MOUTH AT BEDTIME AS NEEDED FOR ANXIETY 01/16/18   [provider]  levothyroxine (SYNTHROID, LEVOTHROID) 125 MCG tablet Take 1 tablet (125 mcg total) by mouth daily before breakfast. 30 minutes before breakfast 03/11/18   McLean-Scocuzza, Nino Glow, MD  Omega-3 1000 MG CAPS Take by mouth.    [provider]    Family History  Problem Relation Age of Onset  . Depression Mother   . Anxiety disorder Mother   . Schizophrenia Mother   . Paranoid behavior Mother   . Stroke Mother   . COPD Father   . Early death Father   . Hearing loss Father   . Heart disease Father   .  Depression Brother   . COPD Brother   . Hypertension Brother   . Kidney disease Brother      Social History   Tobacco Use  . Smoking status: Current Every Day Smoker    Packs/day: 0.50    Types: Cigarettes  . Smokeless tobacco: Never Used  Substance Use Topics  . Alcohol use: Not Currently    Alcohol/week: 1.0 standard drinks    Types: 1 Glasses of wine per week  . Drug use: Yes    Types: Marijuana    Allergies as of 04/10/2018  . (No Known Allergies)    Review of Systems:    All systems reviewed and negative except where noted in HPI.   Physical Exam:  There were no vitals taken for this visit. No LMP recorded. General:   Alert,  Well-developed, well-nourished, pleasant and  cooperative in NAD Head:  Normocephalic and atraumatic. Eyes:  Sclera clear, no icterus.   Conjunctiva pink. Ears:  Normal auditory acuity. Nose:  No deformity, discharge, or lesions. Mouth:  No deformity or lesions,oropharynx pink & moist. Neck:  Supple; no masses or thyromegaly. Lungs:  Respirations even and unlabored.  Clear throughout to auscultation.   No wheezes, crackles, or rhonchi. No acute distress. Heart:  Regular rate and rhythm; no murmurs, clicks, rubs, or gallops. Abdomen:  Normal bowel sounds.  No bruits.  Soft, non-tender and non-distended without masses, hepatosplenomegaly or hernias noted.  No guarding or rebound tenderness.  Negative Carnett sign.   Rectal:  Deferred.  Msk:  Symmetrical without gross deformities.  Good, equal movement & strength bilaterally. Pulses:  Normal pulses noted. Extremities:  No clubbing or edema.  No cyanosis. Neurologic:  Alert and oriented x3;  grossly normal neurologically. Skin:  Intact without significant lesions or rashes.  No jaundice. Lymph Nodes:  No significant cervical adenopathy. Psych:  Alert and cooperative. Normal mood and affect.  Imaging Studies: No results found.  Assessment and Plan:   Maureen Pena is a 63 y.o. y/o female who comes in today with a history of Barrett's esophagus which is questionable when reviewing the photos of her esophagus.  The patient also has a history of diverticulosis and has been told that if she has any further episodes of abdominal pain she should contact our office and be set up for a CT scan of the abdomen to make sure she does or does not have diverticulitis.  She states she has had 2 episodes this year which she thought were diverticulitis.  The patient will also be set up for an EGD because of her history of Barrett's to assess whether or not she does have Barrett's esophagus.  The patient is not due for a colonoscopy at this time.  She also reports that her diarrhea only happens after  her first solid bowel movement of the day and she has tried to decrease dairy products.  The patient has been told to increase fiber in her diet by taking a supplement once a day to see if she can solidify her stools.  The patient will follow-up at the time of the EGD.  Lucilla Lame, MD. Marval Regal    Note: This dictation was prepared with Dragon dictation along with smaller phrase technology. Any transcriptional errors that result from this process are unintentional.

## 2018-04-11 ENCOUNTER — Ambulatory Visit (INDEPENDENT_AMBULATORY_CARE_PROVIDER_SITE_OTHER): Payer: BLUE CROSS/BLUE SHIELD | Admitting: Internal Medicine

## 2018-04-11 ENCOUNTER — Encounter: Payer: Self-pay | Admitting: Internal Medicine

## 2018-04-11 ENCOUNTER — Other Ambulatory Visit (HOSPITAL_COMMUNITY)
Admission: RE | Admit: 2018-04-11 | Discharge: 2018-04-11 | Disposition: A | Discharge status: Home / Self Care | Payer: BLUE CROSS/BLUE SHIELD | Source: Ambulatory Visit | Attending: Internal Medicine | Admitting: Internal Medicine

## 2018-04-11 VITALS — BP 134/70 | HR 67 | Temp 98.5°F | Ht 66.0 in | Wt 163.8 lb

## 2018-04-11 DIAGNOSIS — Z124 Encounter for screening for malignant neoplasm of cervix: Secondary | ICD-10-CM

## 2018-04-11 DIAGNOSIS — Z23 Encounter for immunization: Secondary | ICD-10-CM | POA: Diagnosis not present

## 2018-04-11 DIAGNOSIS — E039 Hypothyroidism, unspecified: Secondary | ICD-10-CM

## 2018-04-11 DIAGNOSIS — Z0000 Encounter for general adult medical examination without abnormal findings: Secondary | ICD-10-CM | POA: Diagnosis not present

## 2018-04-11 MED ORDER — LEVOTHYROXINE SODIUM 125 MCG PO TABS: 125.0000 ug | ORAL_TABLET | Freq: Every day | ORAL | 1 refills | Status: DC

## 2018-04-11 NOTE — Patient Instructions (Addendum)
Pick up levothyroxine 125 mcg   Cholesterol Cholesterol is a white, waxy, fat-like substance that is needed by the human body in small amounts. The liver makes all the cholesterol we need. Cholesterol is carried from the liver by the blood through the blood vessels. Deposits of cholesterol (plaques) may build up on blood vessel (artery) walls. Plaques make the arteries narrower and stiffer. Cholesterol plaques increase the risk for heart attack and stroke. You cannot feel your cholesterol level even if it is very high. The only way to know that it is high is to have a blood test. Once you know your cholesterol levels, you should keep a record of the test results. Work with your health care provider to keep your levels in the desired range. What do the results mean?  Total cholesterol is a rough measure of all the cholesterol in your blood.  LDL (low-density lipoprotein) is the "bad" cholesterol. This is the type that causes plaque to build up on the artery walls. You want this level to be low.  HDL (high-density lipoprotein) is the "good" cholesterol because it cleans the arteries and carries the LDL away. You want this level to be high.  Triglycerides are fat that the body can either burn for energy or store. High levels are closely linked to heart disease. What are the desired levels of cholesterol?  Total cholesterol below 200.  LDL below 100 for people who are at risk, below 70 for people at very high risk.  HDL above 40 is good. A level of 60 or higher is considered to be protective against heart disease.  Triglycerides below 150. How can I lower my cholesterol? Diet Follow your diet program as told by your health care provider.  Choose fish or white meat chicken and [place], roasted or baked. Limit fatty cuts of red meat, fried foods, and processed meats, such as sausage and lunch meats.  Eat lots of fresh fruits and vegetables.  Choose whole grains, beans, pasta, potatoes, and  cereals.  Choose olive oil, corn oil, or canola oil, and use only small amounts.  Avoid butter, mayonnaise, shortening, or palm kernel oils.  Avoid foods with trans fats.  Drink skim or nonfat milk and eat low-fat or nonfat yogurt and cheeses. Avoid whole milk, cream, ice cream, egg yolks, and full-fat cheeses.  Healthier desserts include angel food cake, ginger snaps, animal crackers, hard candy, popsicles, and low-fat or nonfat frozen yogurt. Avoid pastries, cakes, pies, and cookies.  Exercise  Follow your exercise program as told by your health care provider. A regular program: ? Helps to decrease LDL and raise HDL. ? Helps with weight control.  Do things that increase your activity level, such as gardening, walking, and taking the stairs.  Ask your health care provider about ways that you can be more active in your daily life.  Medicine  Take over-the-counter and prescription medicines only as told by your health care provider. ? Medicine may be prescribed by your health care provider to help lower cholesterol and decrease the risk for heart disease. This is usually done if diet and exercise have failed to bring down cholesterol levels. ? If you have several risk factors, you may need medicine even if your levels are normal.  This information is not intended to replace advice given to you by your health care provider. Make sure you discuss any questions you have with your health care provider. Document Released: 02/20/2001 Document Revised: 12/24/2015 Document Reviewed: 11/26/2015 Elsevier Interactive Patient  Education  2018 Elsevier Inc.  

## 2018-04-11 NOTE — Progress Notes (Signed)
Pre visit review using our clinic review tool, if applicable. No additional management support is needed unless otherwise documented below in the visit note. 

## 2018-04-11 NOTE — Progress Notes (Addendum)
Chief Complaint  Patient presents with  . Annual Exam   Annual exam  1. HLD trending down declines statin though ASCVD risk score 8.0 will try healthy diet change 2. Her dog died in its sleep today so today is not a good day  3. Hypothyroidism increase levo to 125   Review of Systems  Constitutional: Negative for weight loss.  HENT: Negative for hearing loss.   Eyes: Negative for blurred vision.  Respiratory: Negative for shortness of breath.   Cardiovascular: Negative for chest pain.  Gastrointestinal: Negative for abdominal pain.  Musculoskeletal: Negative for falls.  Skin: Negative for rash.  Psychiatric/Behavioral: Negative for depression.       Grief    Past Medical History:  Diagnosis Date  . Anxiety   . Barrett's esophagus   . Depression   . Diverticulitis   . History of chicken pox   . Thyroid disease    hypothyroidism  . UTI (urinary tract infection)    Past Surgical History:  Procedure Laterality Date  . TUBAL LIGATION     Family History  Problem Relation Age of Onset  . Depression Mother   . Anxiety disorder Mother   . Schizophrenia Mother   . Paranoid behavior Mother   . Stroke Mother   . COPD Father   . Early death Father   . Hearing loss Father   . Heart disease Father   . Depression Brother   . COPD Brother   . Hypertension Brother   . Kidney disease Brother    Social History   Socioeconomic History  . Marital status: Divorced    Spouse name: Not on file  . Number of children: 2  . Years of education: Not on file  . Highest education level: Bachelor's degree (e.g., BA, AB, BS)  Occupational History    Comment: part time  Social Needs  . Financial resource strain: Not hard at all  . Food insecurity:    Worry: Never true    Inability: Never true  . Transportation needs:    Medical: No    Non-medical: No  Tobacco Use  . Smoking status: Current Every Day Smoker    Packs/day: 0.50    Types: Cigarettes  . Smokeless tobacco: Never  Used  Substance and Sexual Activity  . Alcohol use: Not Currently    Alcohol/week: 1.0 standard drinks    Types: 1 Glasses of wine per week  . Drug use: Yes    Types: Marijuana  . Sexual activity: Not Currently  Lifestyle  . Physical activity:    Days per week: 7 days    Minutes per session: 20 min  . Stress: To some extent  Relationships  . Social connections:    Talks on phone: More than three times a week    Gets together: Three times a week    Attends religious service: Never    Active member of club or organization: No    Attends meetings of clubs or organizations: Never    Relationship status: Divorced  . Intimate partner violence:    Fear of current or ex partner: No    Emotionally abused: No    Physically abused: No    Forced sexual activity: No  Other Topics Concern  . 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 Cholecalciferol (VITAMIN D-3 PO) Take 500 mg by mouth.  . escitalopram (LEXAPRO) 10 MG tablet Take 1 tablet (10 mg total) by mouth daily.  . hydrOXYzine (ATARAX/VISTARIL) 25 MG tablet TAKE 1 TABLET BY MOUTH AT BEDTIME AS NEEDED FOR ANXIETY  . levothyroxine (SYNTHROID, LEVOTHROID) 125 MCG tablet Take 1 tablet (125 mcg total) by mouth daily before breakfast. 30 minutes before breakfast  . Omega-3 1000 MG CAPS Take by mouth.  . [DISCONTINUED] levothyroxine (SYNTHROID, LEVOTHROID) 125 MCG tablet Take 1 tablet (125 mcg total) by mouth daily before breakfast. 30 minutes before breakfast   No Known Allergies Recent Results (from the past 2160 hour(s))  Urinalysis, Routine w reflex microscopic     Status: None   Collection Time: 03/10/18  8:04 AM  Result Value Ref Range   Specific Gravity, UA 1.017 1.005 - 1.030   pH, UA 7.0 5.0 - 7.5   Color, UA Yellow Yellow   Appearance Ur Clear Clear   Leukocytes, UA Negative Negative    Protein, UA Negative Negative/Trace   Glucose, UA Negative Negative   Ketones, UA Negative Negative   RBC, UA Negative Negative   Bilirubin, UA Negative Negative   Urobilinogen, Ur 0.2 0.2 - 1.0 mg/dL   Nitrite, UA Negative Negative   Microscopic Examination Comment     Comment: Microscopic not indicated and not performed.  Comprehensive metabolic panel     Status: Abnormal   Collection Time: 03/10/18  8:04 AM  Result Value Ref Range   Glucose, Bld 92 65 - 99 mg/dL    Comment: .            Fasting reference interval .    BUN 16 7 - 25 mg/dL   Creat 1.04 (H) 0.50 - 0.99 mg/dL    Comment: For patients >41 years of age, the reference limit for Creatinine is approximately 13% higher for people identified as African-American. .    BUN/Creatinine Ratio 15 6 - 22 (calc)   Sodium 142 135 - 146 mmol/L   Potassium 5.0 3.5 - 5.3 mmol/L   Chloride 108 98 - 110 mmol/L   CO2 23 20 - 32 mmol/L   Calcium 9.5 8.6 - 10.4 mg/dL   Total Protein 6.1 6.1 - 8.1 g/dL   Albumin 4.0 3.6 - 5.1 g/dL   Globulin 2.1 1.9 - 3.7 g/dL (calc)   AG Ratio 1.9 1.0 - 2.5 (calc)   Total Bilirubin 0.4 0.2 - 1.2 mg/dL   Alkaline phosphatase (APISO) 81 33 - 130 U/L   AST 16 10 - 35 U/L   ALT 13 6 - 29 U/L  CBC with Differential/Platelet     Status: None   Collection Time: 03/10/18  8:04 AM  Result Value Ref Range   WBC 6.9 3.8 - 10.8 Thousand/uL   RBC 4.83 3.80 - 5.10 Million/uL   Hemoglobin 14.8 11.7 - 15.5 g/dL   HCT 44.1 35.0 - 45.0 %   MCV 91.3 80.0 - 100.0 fL   MCH 30.6 27.0 - 33.0 pg   MCHC 33.6 32.0 - 36.0 g/dL   RDW 13.0 11.0 - 15.0 %   Platelets 233 140 - 400 Thousand/uL   MPV 12.1 7.5 - 12.5 fL   Neutro Abs 4,078 1,500 - 7,800 cells/uL   Lymphs Abs 1,898 850 - 3,900 cells/uL   WBC mixed population 380 200 - 950 cells/uL   Eosinophils Absolute 462 15 - 500 cells/uL  Basophils Absolute 83 0 - 200 cells/uL   Neutrophils Relative % 59.1 %   Total Lymphocyte 27.5 %   Monocytes Relative 5.5 %    Eosinophils Relative 6.7 %   Basophils Relative 1.2 %  Lipid panel     Status: Abnormal   Collection Time: 03/10/18  8:04 AM  Result Value Ref Range   Cholesterol 217 (H) <200 mg/dL   HDL 55 >50 mg/dL   Triglycerides 99 <150 mg/dL   LDL Cholesterol (Calc) 141 (H) mg/dL (calc)    Comment: Reference range: <100 . Desirable range <100 mg/dL for primary prevention;   <70 mg/dL for patients with CHD or diabetic patients  with > or = 2 CHD risk factors. Marland Kitchen LDL-C is now calculated using the Martin-Hopkins  calculation, which is a validated novel method providing  better accuracy than the Friedewald equation in the  estimation of LDL-C.  Cresenciano Genre et al. Annamaria Helling. 0263;785(88): 2061-2068  (http://education.[facility].com/faq/FAQ164)    Total CHOL/HDL Ratio 3.9 <5.0 (calc)   Non-HDL Cholesterol (Calc) 162 (H) <130 mg/dL (calc)    Comment: For patients with diabetes plus 1 major ASCVD risk  factor, treating to a non-HDL-C goal of <100 mg/dL  (LDL-C of <70 mg/dL) is considered a therapeutic  option.   Vitamin D (25 hydroxy)     Status: Abnormal   Collection Time: 03/10/18  8:04 AM  Result Value Ref Range   Vit D, 25-Hydroxy 27 (L) 30 - 100 ng/mL    Comment: Vitamin D Status         25-OH Vitamin D: . Deficiency:                    <20 ng/mL Insufficiency:             20 - 29 ng/mL Optimal:                 > or = 30 ng/mL . For 25-OH Vitamin D testing on patients on  D2-supplementation and patients for whom quantitation  of D2 and D3 fractions is required, the QuestAssureD(TM) 25-OH VIT D, (D2,D3), LC/MS/MS is recommended: order  code [phone] (patients >38yr). . For more information on this test, go to: http://education.[facility].com/faq/FAQ163 (This link is being provided for  informational/educational purposes only.)   Hepatitis C antibody     Status: None   Collection Time: 03/10/18  8:04 AM  Result Value Ref Range   Hepatitis C Ab NON-REACTIVE NON-REACTI   SIGNAL  TO CUT-OFF 0.05 <1.00    Comment: . HCV antibody was non-reactive. There is no laboratory  evidence of HCV infection. . In most cases, no further action is required. However, if recent HCV exposure is suspected, a test for HCV RNA (test code [phone] is suggested. . For additional information please refer to http://education.[facility].com/faq/FAQ22v1 (This link is being provided for informational/ educational purposes only.) .   TSH     Status: Abnormal   Collection Time: 03/10/18  8:04 AM  Result Value Ref Range   TSH 5.65 (H) 0.40 - 4.50 mIU/L   Objective  Body mass index is 26.44 kg/m. Wt Readings from Last 3 Encounters:  04/11/18 163 lb 12.8 oz (74.3 kg)  04/10/18 164 lb (74.4 kg)  02/17/18 158 lb 6.4 oz (71.8 kg)   Temp Readings from Last 3 Encounters:  04/11/18 98.5 F (36.9 C) (Oral)  02/17/18 98.5 F (36.9 C) (Oral)   BP Readings from Last 3 Encounters:  04/11/18 134/70  04/10/18 122/67  02/17/18 102/62   Pulse Readings from Last 3 Encounters:  04/11/18 67  04/10/18 67  02/17/18 75    Physical Exam  Constitutional: She is oriented to person, place, and time. Vital signs are normal. She appears well-developed and well-nourished. She is cooperative.  HENT:  Head: Normocephalic and atraumatic.  Mouth/Throat: Oropharynx is clear and moist and mucous membranes are normal.  Eyes: Pupils are equal, round, and reactive to light. Conjunctivae are normal.  Cardiovascular: Normal rate, regular rhythm and normal heart sounds.  Pulmonary/Chest: Effort normal and breath sounds normal. She exhibits no mass and no tenderness. Right breast exhibits no inverted nipple, no mass, no nipple discharge, no skin change and no tenderness. Left breast exhibits no inverted nipple, no mass, no nipple discharge, no skin change and no tenderness. No breast swelling, tenderness, discharge or bleeding. Breasts are symmetrical.  Genitourinary: Vagina normal and uterus normal. Rectal  exam shows external hemorrhoid. No breast swelling, tenderness, discharge or bleeding. Pelvic exam was performed with patient supine. There is no rash on the right labia. There is no rash on the left labia. Cervix exhibits no motion tenderness, no discharge and no friability. Right adnexum displays no mass, no tenderness and no fullness. Left adnexum displays no mass, no tenderness and no fullness.  Neurological: She is alert and oriented to person, place, and time. Gait normal.  Skin: Skin is warm, dry and intact.  Psychiatric: She has a normal mood and affect. Her speech is normal and behavior is normal. Judgment and thought content normal. Cognition and memory are normal.  Nursing note and vitals reviewed.   Assessment   1.annual  2. hypothyroidism Plan  Flu shot today  pna 23 think about  03/07/09 Tdap   Disc shingrix today will wait until after flu shot Declines MMR and hep B check   11/18 mammogram  -need to get copy [facility] requested today  Pap at f/u h/o abnormal x 1  -pap today  Colonoscopy had 08/27/11 and EGD due repeat h/o barretts pending EGD with Dr. Allen Norris 05/02/18  rec smoking cessation smoking 10-12 cig/qd no FH lung cancer never smoked more than this  Derm sch 11/18  Given cholesterol handout  2. 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 Olivia Mackie McLean-Scocuz[facility]

## 2018-04-14 ENCOUNTER — Encounter: Payer: Self-pay | Admitting: Psychiatry

## 2018-04-14 ENCOUNTER — Ambulatory Visit: Payer: BLUE CROSS/BLUE SHIELD | Admitting: Psychiatry

## 2018-04-14 ENCOUNTER — Other Ambulatory Visit: Payer: Self-pay

## 2018-04-14 VITALS — BP 123/76 | HR 79 | Temp 98.9°F | Wt 164.4 lb

## 2018-04-14 DIAGNOSIS — F330 Major depressive disorder, recurrent, mild: Secondary | ICD-10-CM | POA: Diagnosis not present

## 2018-04-14 DIAGNOSIS — F17200 Nicotine dependence, unspecified, uncomplicated: Secondary | ICD-10-CM | POA: Diagnosis not present

## 2018-04-14 MED ORDER — HYDROXYZINE HCL 25 MG PO TABS: 25.0000 mg | ORAL_TABLET | Freq: Every evening | ORAL | 2 refills | Status: DC | PRN

## 2018-04-14 NOTE — Progress Notes (Signed)
[facility] MD OP Progress Note  04/14/2018 11:02 AM Kendall Arnell  MRN:  [id number]  Chief Complaint: ' I am here for follow up." Chief Complaint    Follow-up; Medication Refill     HPI: Maureen Pena is a 62 yr old Caucasian female, employed, divorced, lives in [place], has a history of depression, hypothyroidism, presented to the clinic today for a follow-up visit.  Patient today reports that she is compliant on her Lexapro.  She reports she had noticed herself to be very drowsy when she initially went up on the dosage.  She however reports it may be getting better to some extent.  Discussed with patient to take it in the evening if she struggles with side effects of drowsiness.  Also discussed she could take it in divided dosage of 5 mg twice a day.  She agrees with plan.  Patient reports she is making some progress with regards to her mood symptoms.  She reports sleep is fair.  She reports she does take hydroxyzine as needed once a week or so when she struggles with sleep problems.  Discussed with her its okay to take it as needed however discussed side effects of hydroxyzine including its effect on her cognitive function.  Patient reports some recent stressors due to her dog dying.  She reports her dog died in his sleep and it came off as a surprise.  She reports she is coping okay.  Patient continues to be in psychotherapy sessions which is going well.  Patient denies any other concerns today.   Visit Diagnosis:    ICD-10-CM   1. MDD (major depressive disorder), recurrent episode, mild (HCC) F33.0 hydrOXYzine (ATARAX/VISTARIL) 25 MG tablet   improved  2. Tobacco use disorder F17.200     Past Psychiatric History: I have reviewed past psychiatric history from my progress note on 02/06/2018.  Past trials of Lexapro, hydroxyzine.  Past Medical History:  Past Medical History:  Diagnosis Date  . Anxiety   . Barrett's esophagus   . Depression   . Diverticulitis   . History of  chicken pox   . Thyroid disease    hypothyroidism  . UTI (urinary tract infection)     Past Surgical History:  Procedure Laterality Date  . TUBAL LIGATION      Family Psychiatric History: I have reviewed family psychiatric history from my progress note on 02/06/2018.  Family History:  Family History  Problem Relation Age of Onset  . Depression Mother   . Anxiety disorder Mother   . Schizophrenia Mother   . Paranoid behavior Mother   . Stroke Mother   . COPD Father   . Early death Father   . Hearing loss Father   . Heart disease Father   . Depression Brother   . COPD Brother   . Hypertension Brother   . Kidney disease Brother     Social History: Reviewed social history from my progress note on 02/06/2018 Social History   Socioeconomic History  . Marital status: Divorced    Spouse name: Not on file  . Number of children: 2  . Years of education: Not on file  . Highest education level: Bachelor's degree (e.g., BA, AB, BS)  Occupational History    Comment: part time  Social Needs  . Financial resource strain: Not hard at all  . Food insecurity:    Worry: Never true    Inability: Never true  . Transportation needs:    Medical: No    Non-medical: No  Tobacco Use  . Smoking status: Current Every Day Smoker    Packs/day: 0.50    Types: Cigarettes  . Smokeless tobacco: Never Used  Substance and Sexual Activity  . Alcohol use: Not Currently    Alcohol/week: 1.0 standard drinks    Types: 1 Glasses of wine per week  . Drug use: Yes    Types: Marijuana  . Sexual activity: Not Currently  Lifestyle  . Physical activity:    Days per week: 7 days    Minutes per session: 20 min  . Stress: To some extent  Relationships  . Social connections:    Talks on phone: More than three times a week    Gets together: Three times a week    Attends religious service: Never    Active member of club or organization: No    Attends meetings of clubs or organizations: Never     Relationship status: Divorced  Other Topics Concern  . 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 Cholecalciferol (VITAMIN D-3 PO) Take 500 mg by mouth.    . Cholecalciferol (VITAMIN D-3) 5000 units TABS Take by mouth.    . escitalopram (LEXAPRO) 10 MG tablet Take 1 tablet (10 mg total) by mouth daily. 90 tablet 0  . hydrOXYzine (ATARAX/VISTARIL) 25 MG tablet Take 1 tablet (25 mg total) by mouth at bedtime as needed (for sleep). 30 tablet 2  . levothyroxine (SYNTHROID, LEVOTHROID) 125 MCG tablet Take 1 tablet (125 mcg total) by mouth daily before breakfast. 30 minutes before breakfast 90 tablet 1  . Omega-3 1000 MG CAPS Take by mouth.     No current facility-administered medications for this visit.      Musculoskeletal: Strength & Muscle Tone: within normal limits Gait & Station: normal Patient leans: N/A  Psychiatric Specialty Exam: Review of Systems  Psychiatric/Behavioral: The patient is nervous/anxious (situational).   All other systems reviewed and are negative.   Blood pressure 123/76, pulse 79, temperature 98.9 F (37.2 C), temperature source Oral, weight 164 lb 6.4 oz (74.6 kg).Body mass index is 26.53 kg/m.  General Appearance: Casual  Eye Contact:  Fair  Speech:  Clear and Coherent  Volume:  Normal  Mood:   Anxious on and off   Affect:  Congruent  Thought Process:  Goal Directed and Descriptions of Associations: Intact  Orientation:  Full (Time, Place, and Person)  Thought Content: Logical   Suicidal Thoughts:  No  Homicidal Thoughts:  No  Memory:  Immediate;   Fair Recent;   Fair Remote;   Fair  Judgement:  Fair  Insight:  Fair  Psychomotor Activity:  Normal  Concentration:  Concentration: Fair and Attention Span: Fair  Recall:  Good  Fund of Knowledge: Fair  Language: Fair  Akathisia:  No  Handed:  Right  AIMS (if indicated): na  Assets:  Communication Skills Desire for Improvement Social Support  ADL's:  Intact  Cognition: WNL  Sleep:  Fair   Screenings: GAD-7     Office Visit from 02/17/2018 in [facility]  Total GAD-7 Score  9    PHQ2-9     Office Visit from 02/17/2018 in [facility]  PHQ-2 Total Score  4  PHQ-9 Total Score  11       Assessment and Plan: Dajanee is a 62 year old Caucasian female, divorced, employed, lives in [place], has a history of depression, GERD, hypothyroidism, presented to the clinic today for a follow-up visit.  Patient is struggling with some grogginess and drowsiness on the Lexapro.  She however reports it is getting better.  Patient will continue psychotherapy sessions.  Plan as noted below.  Plan For MDD Continue Lexapro 10 mg p.o. daily Continue psychotherapy. phq 9 - 7  For anxiety disorder unspecified-rule out GAD Hydroxyzine as prescribed.  She however takes it only at bedtime since it helps with sleep.   Lexapro 10 mg p.o. daily  For tobacco use disorder Provided smoking cessation counseling.  Follow-up in clinic in 6-8 weeks or sooner if needed.  More than 50 % of the time was spent for psychoeducation and supportive psychotherapy and care coordination.  This note was generated in part or whole with voice recognition software. Voice recognition is usually quite accurate but there  are transcription errors that can and very often do occur. I apologize for any typographical errors that were not detected and corrected.       Ursula Alert, MD 04/14/2018, 11:02 AM

## 2018-04-15 LAB — CYTOLOGY - PAP
Diagnosis: NEGATIVE
HPV (WINDOPATH): NOT DETECTED

## 2018-04-18 ENCOUNTER — Other Ambulatory Visit: Payer: Self-pay

## 2018-04-18 DIAGNOSIS — K2270 Barrett's esophagus without dysplasia: Secondary | ICD-10-CM

## 2018-04-21 ENCOUNTER — Encounter: Payer: Self-pay | Admitting: Licensed Clinical Social Worker

## 2018-04-21 ENCOUNTER — Ambulatory Visit: Payer: BLUE CROSS/BLUE SHIELD | Admitting: Licensed Clinical Social Worker

## 2018-04-21 DIAGNOSIS — F330 Major depressive disorder, recurrent, mild: Secondary | ICD-10-CM

## 2018-04-21 NOTE — Progress Notes (Signed)
   THERAPIST PROGRESS NOTE  Session Time: 1000  Participation Level: Active  Behavioral Response: Well GroomedAlertDepressed  Type of Therapy: Individual Therapy  Treatment Goals addressed: Coping  Interventions: Solution Focused  Summary: Clois Montavon is a 62 y.o. female who presents with symptoms related to her diagnosis. Colandra reported feeling depressed over the past two weeks after her dog suddenly died. She reported feeling a sense of "this isn't real," since he passed. We discussed ways to cope with his death, including recounting memories of memorable times, and finding comfort in knowing he passed quietly in his sleep. Alayziah also expressed frustration regarding her relationship with her youngest daughter. She stated, "We just don't communicate at all. And, when I try to tell her how I'm feeling, she calls me manipulative and shitty." Nallely read a text conversation aloud between her and her youngest daughter. During the conversation, Sharlyn invited her daughter on a trip and was "rejected. She just doesn't want to come see me." This led to a discussion about assertive communication, and ways to appropriately express feelings to individuals we care about. We discussed  "I statements," and how to utilize them appropriately. We also discussed the idea that others cannot make you feel anything. Lashonta expressed understanding and agreement about this information.   Suicidal/Homicidal: No  Therapist Response: Pearlena is able make connections during sessions, and accept new ideas regarding various aspects of her life. She is open to suggestions and observations. We will continue to utilize CBT in order to manage depression symptoms, and we will continue to work on communication skills in order to improve relationship dynamics in her life. Today, Jaxsyn was encouraged to have a conversation with her daughter regarding her feelings, and attempt to do so without blaming  statements. Krystena expressed agreement with this plan.   Plan: Return again in 2 weeks.  Diagnosis: Axis I: MDD recurrent episode, mild    Axis II: No diagnosis    [name], LCSW 04/21/2018

## 2018-04-28 ENCOUNTER — Ambulatory Visit
Admission: RE | Admit: 2018-04-28 | Discharge: 2018-04-28 | Disposition: A | Discharge status: Home / Self Care | Payer: BLUE CROSS/BLUE SHIELD | Source: Ambulatory Visit | Attending: Physician Assistant | Admitting: Physician Assistant

## 2018-04-28 ENCOUNTER — Encounter: Payer: Self-pay | Admitting: Radiology

## 2018-04-28 DIAGNOSIS — Z1231 Encounter for screening mammogram for malignant neoplasm of breast: Secondary | ICD-10-CM | POA: Diagnosis present

## 2018-04-30 ENCOUNTER — Encounter: Payer: Self-pay | Admitting: Internal Medicine

## 2018-04-30 NOTE — Discharge Instructions (Signed)
General Anesthesia, Adult, Care After °These instructions provide you with information about caring for yourself after your procedure. Your health care provider may also give you more specific instructions. Your treatment has been planned according to current medical practices, but problems sometimes occur. Call your health care provider if you have any problems or questions after your procedure. °What can I expect after the procedure? °After the procedure, it is common to have: °· Vomiting. °· A sore throat. °· Mental slowness. ° °It is common to feel: °· Nauseous. °· Cold or shivery. °· Sleepy. °· Tired. °· Sore or achy, even in parts of your body where you did not have surgery. ° °Follow these instructions at home: °For at least 24 hours after the procedure: °· Do not: °? Participate in activities where you could fall or become injured. °? Drive. °? Use heavy machinery. °? Drink alcohol. °? Take sleeping pills or medicines that cause drowsiness. °? Make important decisions or sign legal documents. °? Take care of children on your own. °· Rest. °Eating and drinking °· If you vomit, drink water, juice, or soup when you can drink without vomiting. °· Drink enough fluid to keep your urine clear or pale yellow. °· Make sure you have little or no nausea before eating solid foods. °· Follow the diet recommended by your health care provider. °General instructions °· Have a responsible adult stay with you until you are awake and alert. °· Return to your normal activities as told by your health care provider. Ask your health care provider what activities are safe for you. °· Take over-the-counter and prescription medicines only as told by your health care provider. °· If you smoke, do not smoke without supervision. °· Keep all follow-up visits as told by your health care provider. This is important. °Contact a health care provider if: °· You continue to have nausea or vomiting at home, and medicines are not helpful. °· You  cannot drink fluids or start eating again. °· You cannot urinate after 8-12 hours. °· You develop a skin rash. °· You have fever. °· You have increasing redness at the site of your procedure. °Get help right away if: °· You have difficulty breathing. °· You have chest pain. °· You have unexpected bleeding. °· You feel that you are having a life-threatening or urgent problem. °This information is not intended to replace advice given to you by your health care provider. Make sure you discuss any questions you have with your health care provider. °Document Released: 09/03/2000 Document Revised: 10/31/2015 Document Reviewed: 05/12/2015 °Elsevier Interactive Patient Education © 2018 [employer] ° °

## 2018-05-02 ENCOUNTER — Ambulatory Visit: Payer: BLUE CROSS/BLUE SHIELD | Admitting: Anesthesiology

## 2018-05-02 ENCOUNTER — Ambulatory Visit
Admission: RE | Admit: 2018-05-02 | Discharge: 2018-05-02 | Disposition: A | Discharge status: Home / Self Care | Payer: BLUE CROSS/BLUE SHIELD | Source: Ambulatory Visit | Attending: Gastroenterology | Admitting: Gastroenterology

## 2018-05-02 ENCOUNTER — Encounter
Admission: RE | Disposition: A | Discharge status: Home / Self Care | Payer: Self-pay | Source: Ambulatory Visit | Attending: Gastroenterology

## 2018-05-02 DIAGNOSIS — F329 Major depressive disorder, single episode, unspecified: Secondary | ICD-10-CM | POA: Insufficient documentation

## 2018-05-02 DIAGNOSIS — Z79899 Other long term (current) drug therapy: Secondary | ICD-10-CM | POA: Diagnosis not present

## 2018-05-02 DIAGNOSIS — F419 Anxiety disorder, unspecified: Secondary | ICD-10-CM | POA: Diagnosis not present

## 2018-05-02 DIAGNOSIS — K22719 Barrett's esophagus with dysplasia, unspecified: Secondary | ICD-10-CM | POA: Diagnosis not present

## 2018-05-02 DIAGNOSIS — E039 Hypothyroidism, unspecified: Secondary | ICD-10-CM | POA: Insufficient documentation

## 2018-05-02 DIAGNOSIS — K2270 Barrett's esophagus without dysplasia: Secondary | ICD-10-CM | POA: Insufficient documentation

## 2018-05-02 DIAGNOSIS — Z79890 Hormone replacement therapy (postmenopausal): Secondary | ICD-10-CM | POA: Diagnosis not present

## 2018-05-02 DIAGNOSIS — K449 Diaphragmatic hernia without obstruction or gangrene: Secondary | ICD-10-CM | POA: Diagnosis not present

## 2018-05-02 DIAGNOSIS — F17210 Nicotine dependence, cigarettes, uncomplicated: Secondary | ICD-10-CM | POA: Insufficient documentation

## 2018-05-02 HISTORY — PX: ESOPHAGOGASTRODUODENOSCOPY (EGD) WITH PROPOFOL: SHX5813

## 2018-05-02 SURGERY — ESOPHAGOGASTRODUODENOSCOPY (EGD) WITH PROPOFOL
Anesthesia: General

## 2018-05-02 MED ORDER — GLYCOPYRROLATE 0.2 MG/ML IJ SOLN
INTRAMUSCULAR | Status: DC | PRN
  Administered 2018-05-02: 0.1 mg via INTRAVENOUS

## 2018-05-02 MED ORDER — LIDOCAINE HCL (CARDIAC) PF 100 MG/5ML IV SOSY
PREFILLED_SYRINGE | INTRAVENOUS | Status: DC | PRN
  Administered 2018-05-02: 80 mg via INTRAVENOUS

## 2018-05-02 MED ORDER — ONDANSETRON HCL 4 MG/2ML IJ SOLN: 4.0000 mg | Freq: Once | INTRAMUSCULAR | Status: DC | PRN

## 2018-05-02 MED ORDER — PROPOFOL 10 MG/ML IV BOLUS
INTRAVENOUS | Status: DC | PRN
  Administered 2018-05-02: 100 mg via INTRAVENOUS

## 2018-05-02 MED ORDER — LACTATED RINGERS IV SOLN
10.0000 mL/h | INTRAVENOUS | Status: DC
  Administered 2018-05-02: 10 mL/h via INTRAVENOUS

## 2018-05-02 SURGICAL SUPPLY — 33 items
BALLN DILATOR 10-12 8 (BALLOONS)
BALLN DILATOR 12-15 8 (BALLOONS)
BALLN DILATOR 15-18 8 (BALLOONS)
BALLN DILATOR CRE 0-12 8 (BALLOONS)
BALLN DILATOR ESOPH 8 10 CRE (MISCELLANEOUS) IMPLANT
BALLOON DILATOR 12-15 8 (BALLOONS) IMPLANT
BALLOON DILATOR 15-18 8 (BALLOONS) IMPLANT
BALLOON DILATOR CRE 0-12 8 (BALLOONS) IMPLANT
BLOCK BITE 60FR ADLT L/F GRN (MISCELLANEOUS) ×2 IMPLANT
CANISTER SUCT 1200ML W/VALVE (MISCELLANEOUS) ×2 IMPLANT
CLIP HMST 235XBRD CATH ROT (MISCELLANEOUS) IMPLANT
CLIP RESOLUTION 360 11X235 (MISCELLANEOUS)
ELECT REM PT RETURN 9FT ADLT (ELECTROSURGICAL)
ELECTRODE REM PT RTRN 9FT ADLT (ELECTROSURGICAL) IMPLANT
FCP ESCP3.2XJMB 240X2.8X (MISCELLANEOUS)
FORCEPS BIOP RAD 4 LRG CAP 4 (CUTTING FORCEPS) IMPLANT
FORCEPS BIOP RJ4 240 W/NDL (MISCELLANEOUS)
FORCEPS ESCP3.2XJMB 240X2.8X (MISCELLANEOUS) IMPLANT
GOWN CVR UNV OPN BCK APRN NK (MISCELLANEOUS) ×2 IMPLANT
GOWN ISOL THUMB LOOP REG UNIV (MISCELLANEOUS) ×2
INJECTOR VARIJECT VIN23 (MISCELLANEOUS) IMPLANT
KIT DEFENDO VALVE AND CONN (KITS) IMPLANT
KIT ENDO PROCEDURE OLY (KITS) ×2 IMPLANT
MARKER SPOT ENDO TATTOO 5ML (MISCELLANEOUS) IMPLANT
RETRIEVER NET PLAT FOOD (MISCELLANEOUS) IMPLANT
SNARE SHORT THROW 13M SML OVAL (MISCELLANEOUS) IMPLANT
SNARE SHORT THROW 30M LRG OVAL (MISCELLANEOUS) IMPLANT
SPOT EX ENDOSCOPIC TATTOO (MISCELLANEOUS)
SYR INFLATION 60ML (SYRINGE) IMPLANT
TRAP ETRAP POLY (MISCELLANEOUS) IMPLANT
VARIJECT INJECTOR VIN23 (MISCELLANEOUS)
WATER STERILE IRR 250ML POUR (IV SOLUTION) ×2 IMPLANT
WIRE CRE 18-20MM 8CM F G (MISCELLANEOUS) IMPLANT

## 2018-05-02 NOTE — Anesthesia Postprocedure Evaluation (Signed)
Anesthesia Post Note  Patient: Maureen Pena  Procedure(s) Performed: ESOPHAGOGASTRODUODENOSCOPY (EGD) WITH PROPOFOL (N/A )  Patient location during evaluation: PACU Anesthesia Type: General Level of consciousness: awake Pain management: pain level controlled Vital Signs Assessment: post-procedure vital signs reviewed and stable Respiratory status: respiratory function stable Cardiovascular status: stable Postop Assessment: no signs of nausea or vomiting Anesthetic complications: no    Veda Canning

## 2018-05-02 NOTE — Transfer of Care (Signed)
Immediate Anesthesia Transfer of Care Note  Patient: Maureen Pena  Procedure(s) Performed: ESOPHAGOGASTRODUODENOSCOPY (EGD) WITH PROPOFOL (N/A )  Patient Location: PACU  Anesthesia Type: General  Level of Consciousness: awake, alert  and patient cooperative  Airway and Oxygen Therapy: Patient Spontanous Breathing and Patient connected to supplemental oxygen  Post-op Assessment: Post-op Vital signs reviewed, Patient's Cardiovascular Status Stable, Respiratory Function Stable, Patent Airway and No signs of Nausea or vomiting  Post-op Vital Signs: Reviewed and stable  Complications: No apparent anesthesia complications

## 2018-05-02 NOTE — Anesthesia Preprocedure Evaluation (Signed)
Anesthesia Evaluation  Patient identified by MRN, date of birth, ID band Patient awake    Reviewed: Allergy & Precautions, NPO status , Patient's Chart, lab work & pertinent test results  Airway Mallampati: II  TM Distance: >3 FB     Dental   Pulmonary Current Smoker,    breath sounds clear to auscultation       Cardiovascular  Rhythm:Regular Rate:Normal     Neuro/Psych Anxiety Depression    GI/Hepatic GERD  ,Barrett's esophagus   Endo/Other  Hypothyroidism   Renal/GU      Musculoskeletal   Abdominal   Peds  Hematology   Anesthesia Other Findings   Reproductive/Obstetrics                             Anesthesia Physical Anesthesia Plan  ASA: II  Anesthesia Plan: General   Post-op Pain Management:    Induction: Intravenous  PONV Risk Score and Plan: TIVA  Airway Management Planned: Natural Airway and Nasal Cannula  Additional Equipment:   Intra-op Plan:   Post-operative Plan:   Informed Consent: I have reviewed the patients History and Physical, chart, labs and discussed the procedure including the risks, benefits and alternatives for the proposed anesthesia with the patient or authorized representative who has indicated his/her understanding and acceptance.     Plan Discussed with: CRNA  Anesthesia Plan Comments:         Anesthesia Quick Evaluation

## 2018-05-02 NOTE — Op Note (Signed)
[facility] Patient Name: Maureen Pena Procedure Date: 05/02/2018 8:37 AM MRN: [id number] Account #: [id number] Date of Birth: 1955-12-06 Admit Type: Outpatient Age: 62 Room: [facility] OR ROOM 01 Gender: Female Note Status: Finalized Procedure:            Upper GI endoscopy Indications:          Exclusion of Barrett's esophagus, Follow-up of                        Barrett's esophagus Providers:            Lucilla Lame MD, MD Referring MD:         Nino Glow Mclean-Scocuzza MD, MD (Referring MD) Medicines:            Propofol per Anesthesia Complications:        No immediate complications. Procedure:            Pre-Anesthesia Assessment:                       - Prior to the procedure, a History and Physical was                        performed, and patient medications and allergies were                        reviewed. The patient's tolerance of previous                        anesthesia was also reviewed. The risks and benefits of                        the procedure and the sedation options and risks were                        discussed with the patient. All questions were                        answered, and informed consent was obtained. Prior                        Anticoagulants: The patient has taken no previous                        anticoagulant or antiplatelet agents. ASA Grade                        Assessment: II - A patient with mild systemic disease.                        After reviewing the risks and benefits, the patient was                        deemed in satisfactory condition to undergo the                        procedure.                       After obtaining informed consent, the endoscope was  passed under direct vision. Throughout the procedure,                        the patient's blood pressure, pulse, and oxygen                        saturations were monitored continuously. The was               introduced through the mouth, and advanced to the                        second part of duodenum. The upper GI endoscopy was                        accomplished without difficulty. The patient tolerated                        the procedure well. Findings:      A small hiatal hernia was present.      The Z-line was irregular and was found at the gastroesophageal junction.      The stomach was normal.      The examined duodenum was normal. Impression:           - Small hiatal hernia.                       - Z-line irregular, at the gastroesophageal junction.                       - Normal stomach.                       - Normal examined duodenum.                       - No specimens collected. Recommendation:       - Discharge patient to home.                       - Resume previous diet.                       - Continue present medications.                       - No Barrett's esophagus seen. Procedure Code(s):    --- Professional ---                       [phone], Esophagogastroduodenoscopy, flexible, transoral;                        diagnostic, including collection of specimen(s) by                        brushing or washing, when performed (separate procedure) Diagnosis Code(s):    --- Professional ---                       K22.70, Barrett's esophagus without dysplasia CPT copyright 2018 American Medical Association. All rights reserved. The codes documented in this report are preliminary and upon coder review may  be revised to meet current compliance requirements. Lucilla Lame MD, MD 05/02/2018 9:00:18 AM This  report has been signed electronically. Number of Addenda: 0 Note Initiated On: 05/02/2018 8:37 AM Total Procedure Duration: 0 hours 1 minute 47 seconds       [facility]

## 2018-05-02 NOTE — Anesthesia Procedure Notes (Signed)
Procedure Name: General with mask airway Performed by: [name] Germani M, CRNA Pre-anesthesia Checklist: Patient identified, Emergency Drugs available, Suction available, Patient being monitored and Timeout performed Patient Re-evaluated:Patient Re-evaluated prior to induction Oxygen Delivery Method: Nasal cannula       

## 2018-05-02 NOTE — Interval H&P Note (Signed)
History and Physical Interval Note:  05/02/2018 8:15 AM  Maureen Pena  has presented today for surgery, with the diagnosis of Barrett's esophagus k22.70  The various methods of treatment have been discussed with the patient and family. After consideration of risks, benefits and other options for treatment, the patient has consented to  Procedure(s): ESOPHAGOGASTRODUODENOSCOPY (EGD) WITH PROPOFOL (N/A) as a surgical intervention .  The patient's history has been reviewed, patient examined, no change in status, stable for surgery.  I have reviewed the patient's chart and labs.  Questions were answered to the patient's satisfaction.     Maureen Pena [employer]

## 2018-05-06 ENCOUNTER — Other Ambulatory Visit: Payer: Self-pay | Admitting: Psychiatry

## 2018-05-06 DIAGNOSIS — F330 Major depressive disorder, recurrent, mild: Secondary | ICD-10-CM

## 2018-05-12 ENCOUNTER — Encounter: Payer: Self-pay | Admitting: Licensed Clinical Social Worker

## 2018-05-12 ENCOUNTER — Ambulatory Visit: Payer: BLUE CROSS/BLUE SHIELD | Admitting: Licensed Clinical Social Worker

## 2018-05-12 DIAGNOSIS — F330 Major depressive disorder, recurrent, mild: Secondary | ICD-10-CM

## 2018-05-12 NOTE — Progress Notes (Signed)
   THERAPIST PROGRESS NOTE  Session Time: 1100  Participation Level: Active  Behavioral Response: Well GroomedAlertNA  Type of Therapy: Individual Therapy  Treatment Goals addressed: Anxiety  Interventions: CBT  Summary: Maureen Pena is a 62 y.o. female who presents with symptoms related to her diagnosis. [name] reports her anxiety has decreased as well as her depression around the loss of her dog. She reports having had her endoscopy recently and found out she does not actually have Barrett's Esophagus as she was previously diagnosed with--which can lead to cancer. While [name] was very angry about the mis-diagnosis, she was able to reframe her thoughts on the topic and focus on the positive. Her session transitioned into discussion about her younger daughter. She stated, "everytime I go to therapy, I find myself talking about her. How do I detach?" We discussed ways to allow [name] to see her daughter as a more confident, capable person and how she could improve her relationship with her in turn.  LCSW encouraged [name] to begin engaging with her daughter via text on a regular basis with no hidden agenda and no alternative motives other than to check in and see how she is doing. LCSW encouraged [name] to continue this process even if it's met with resistance. [name] expressed understanding and agreement with this idea.   Suicidal/Homicidal: No  Therapist Response: LCSW encouraged [name] to begin engaging with her daughter via text on a regular basis with no hidden agenda and no alternative motives other than to check in and see how she is doing. LCSW encouraged [name] to continue this process even if it's met with resistance. [name] expressed understanding and agreement with this idea. [name] is able to understand ideas presented in each session and apply them to her life outside of therapy. We will continue using CBT moving forward to assist Aren in  managing her symptoms.   Plan: Return again in 4  weeks.  Diagnosis: Axis I: MDD (recurrent, mild)    Axis II: No diagnosis    [name], LCSW 05/12/2018

## 2018-05-16 ENCOUNTER — Encounter: Payer: Self-pay | Admitting: Internal Medicine

## 2018-06-16 ENCOUNTER — Other Ambulatory Visit: Payer: Self-pay

## 2018-06-16 ENCOUNTER — Ambulatory Visit (INDEPENDENT_AMBULATORY_CARE_PROVIDER_SITE_OTHER): Payer: BLUE CROSS/BLUE SHIELD | Admitting: Psychiatry

## 2018-06-16 ENCOUNTER — Encounter: Payer: Self-pay | Admitting: Psychiatry

## 2018-06-16 VITALS — BP 98/66 | HR 76 | Temp 98.9°F | Ht 66.0 in | Wt 169.2 lb

## 2018-06-16 DIAGNOSIS — F3341 Major depressive disorder, recurrent, in partial remission: Secondary | ICD-10-CM

## 2018-06-16 DIAGNOSIS — F17200 Nicotine dependence, unspecified, uncomplicated: Secondary | ICD-10-CM

## 2018-06-16 MED ORDER — ESCITALOPRAM OXALATE 10 MG PO TABS: 10.0000 mg | ORAL_TABLET | Freq: Every day | ORAL | 1 refills | Status: DC

## 2018-06-16 NOTE — Progress Notes (Signed)
[place] MD  OP Progress Note  06/16/2018 1:11 PM Maureen Pena  MRN:  [id number]  Chief Complaint: ' I am here for follow up.' Chief Complaint    Follow-up; Medication Refill     HPI: Maureen Pena is a 63 year old Caucasian female, employed, divorced, lives in [place] , has a history of depression, hypothyroidism, presented to the clinic today for a follow-up visit.  Patient today reports she had a good holiday.  She took a break from work and that helped.  Patient reports her mood symptoms as currently stable on the current medication regimen.  She continues to be compliant with Lexapro as prescribed.  Patient denies any side effects to the Lexapro.  Patient reports she continues to sleep well.  She denies any suicidality, homicidality or perceptual disturbances.  She reports she is currently on meloxicam for pain and has been taking it on and off.  Provided her medication education about combining meloxicam with her Lexapro.  Patient continues to smoke cigarettes and reports she is not ready to quit. Visit Diagnosis:    ICD-10-CM   1. MDD (major depressive disorder), recurrent, in partial remission (HCC) F33.41 escitalopram (LEXAPRO) 10 MG tablet  2. Tobacco use disorder F17.200     Past Psychiatric History: Reviewed past psychiatric history from my progress note on 02/06/2018.  Past trials of Lexapro, hydroxyzine  Past Medical History:  Past Medical History:  Diagnosis Date  . Anxiety   . Barrett's esophagus   . Depression   . Diverticulitis   . History of chicken pox   . Thyroid disease    hypothyroidism  . UTI (urinary tract infection)     Past Surgical History:  Procedure Laterality Date  . ESOPHAGOGASTRODUODENOSCOPY (EGD) WITH PROPOFOL N/A 05/02/2018   Procedure: ESOPHAGOGASTRODUODENOSCOPY (EGD) WITH PROPOFOL;  Surgeon: Lucilla Lame, MD;  Location: [facility];  Service: Endoscopy;  Laterality: N/A;  . TUBAL LIGATION      Family Psychiatric History:  Reviewed family psychiatric history from my progress note on 02/06/2018  Family History:  Family History  Problem Relation Age of Onset  . Depression Mother   . Anxiety disorder Mother   . Schizophrenia Mother   . Paranoid behavior Mother   . Stroke Mother   . COPD Father   . Early death Father   . Hearing loss Father   . Heart disease Father   . Depression Brother   . COPD Brother   . Hypertension Brother   . Kidney disease Brother   . Breast cancer Neg Hx     Social History: Reviewed social history from my progress note on 02/06/2018. Social History   Socioeconomic History  . Marital status: Divorced    Spouse name: Not on file  . Number of children: 2  . Years of education: Not on file  . Highest education level: Bachelor's degree (e.g., BA, AB, BS)  Occupational History    Comment: part time  Social Needs  . Financial resource strain: Not hard at all  . Food insecurity:    Worry: Never true    Inability: Never true  . Transportation needs:    Medical: No    Non-medical: No  Tobacco Use  . Smoking status: Current Every Day Smoker    Packs/day: 0.50    Types: Cigarettes  . Smokeless tobacco: Never Used  Substance and Sexual Activity  . Alcohol use: Not Currently    Alcohol/week: 1.0 standard drinks    Types: 1 Glasses of wine per week  .  Drug use: Yes    Types: Marijuana  . Sexual activity: Not Currently  Lifestyle  . Physical activity:    Days per week: 7 days    Minutes per session: 20 min  . Stress: To some extent  Relationships  . Social connections:    Talks on phone: More than three times a week    Gets together: Three times a week    Attends religious service: Never    Active member of club or organization: No    Attends meetings of clubs or organizations: Never    Relationship status: Divorced  Other Topics Concern  . Not on file  Social History Narrative   Kids    No guns    Wears seat belt    Safe in relationship    Smoker    Lives  with dogs    Lawyer     Allergies: No Known Allergies  Metabolic Disorder Labs: No results found for: HGBA1C, MPG No results found for: PROLACTIN Lab Results  Component Value Date   CHOL 217 (H) 03/10/2018   TRIG 99 03/10/2018   HDL 55 03/10/2018   CHOLHDL 3.9 03/10/2018   LDLCALC 141 (H) 03/10/2018   Lab Results  Component Value Date   TSH 5.65 (H) 03/10/2018    Therapeutic Level Labs: No results found for: LITHIUM No results found for: VALPROATE No components found for:  CBMZ  Current Medications: Current Outpatient Medications  Medication Sig Dispense Refill  . Cholecalciferol (VITAMIN D-3 PO) Take 500 mg by mouth.    . Cholecalciferol (VITAMIN D-3) 5000 units TABS Take by mouth.    . hydrOXYzine (ATARAX/VISTARIL) 25 MG tablet TAKE 1 TABLET (25 MG TOTAL) BY MOUTH AT BEDTIME AS NEEDED (FOR SLEEP). 90 tablet 1  . levothyroxine (SYNTHROID, LEVOTHROID) 125 MCG tablet Take 1 tablet (125 mcg total) by mouth daily before breakfast. 30 minutes before breakfast 90 tablet 1  . meloxicam (MOBIC) 15 MG tablet meloxicam 15 mg tablet  Take 1 tablet every day by oral route.    . Omega-3 1000 MG CAPS Take by mouth.    . escitalopram (LEXAPRO) 10 MG tablet Take 1 tablet (10 mg total) by mouth daily. 90 tablet 1   No current facility-administered medications for this visit.      Musculoskeletal: Strength & Muscle Tone: within normal limits Gait & Station: normal Patient leans: N/A  Psychiatric Specialty Exam: Review of Systems  Psychiatric/Behavioral: Negative for depression. The patient does not have insomnia.   All other systems reviewed and are negative.   Blood pressure 98/66, pulse 76, temperature 98.9 F (37.2 C), temperature source Oral, height 5\' 6"  (1.676 m), weight 169 lb 3.2 oz (76.7 kg).Body mass index is 27.31 kg/m.  General Appearance: Casual  Eye Contact:  Fair  Speech:  Normal Rate  Volume:  Normal  Mood:  Euthymic  Affect:  Congruent  Thought Process:   Goal Directed and Descriptions of Associations: Intact  Orientation:  Full (Time, Place, and Person)  Thought Content: Logical   Suicidal Thoughts:  No  Homicidal Thoughts:  No  Memory:  Immediate;   Fair Recent;   Fair Remote;   Fair  Judgement:  Fair  Insight:  Fair  Psychomotor Activity:  Normal  Concentration:  Concentration: Fair and Attention Span: Fair  Recall:  [employer] of Knowledge: Fair  Language: Fair  Akathisia:  No  Handed:  Right  AIMS (if indicated): denies tremors, rigidity,stiffness  Assets:  Communication Skills Desire  for Improvement Financial Resources/Insurance  ADL's:  Intact  Cognition: WNL  Sleep:  Fair   Screenings: GAD-7     Office Visit from 02/17/2018 in [facility]  Total GAD-7 Score  9    PHQ2-9     Office Visit from 02/17/2018 in [place]  PHQ-2 Total Score  4  PHQ-9 Total Score  11       Assessment and Plan: Allice is a 63 year old Caucasian female, divorced, employed, lives in [place], has a history of depression, GERD, hypothyroidism, presented to the clinic today for a follow-up visit.  Patient continues to improve on the current medication regimen and denies any side effects to the Lexapro at this time.  We will continue plan as noted below.  She will also continue psychotherapy sessions.  Plan For MDD Continue Lexapro 10 mg p.o. daily Continue psychotherapy.  Tobacco use disorder Provided smoking cessation counseling.  She is not ready to quit.  Provided medication education about combining Lexapro with meloxicam.  She is aware about the drug to drug interaction including GI side effects.  Follow-up in clinic in 3 months or sooner if needed.  I have spent atleast 15 minutes face to face with patient today. More than 50 % of the time was spent for psychoeducation and supportive psychotherapy and care coordination.  This note was generated in part or whole with voice recognition  software. Voice recognition is usually quite accurate but there are transcription errors that can and very often do occur. I apologize for any typographical errors that were not detected and corrected.       Ursula Alert, MD 06/16/2018, 1:11 PM

## 2018-06-30 ENCOUNTER — Ambulatory Visit: Payer: BLUE CROSS/BLUE SHIELD | Admitting: Licensed Clinical Social Worker

## 2018-06-30 ENCOUNTER — Encounter: Payer: Self-pay | Admitting: Licensed Clinical Social Worker

## 2018-06-30 DIAGNOSIS — F3341 Major depressive disorder, recurrent, in partial remission: Secondary | ICD-10-CM | POA: Diagnosis not present

## 2018-06-30 NOTE — Progress Notes (Signed)
   THERAPIST PROGRESS NOTE  Session Time: 1000  Participation Level: Active  Behavioral Response: Well GroomedAlertAnxious  Type of Therapy: Individual Therapy  Treatment Goals addressed: Anxiety  Interventions: CBT  Summary: Maureen Pena is a 63 y.o. female who presents with symptoms related to her diagnosis. [name] reports doing well since our last session. She states she had 12 days off around the holidays, which she utilized to "recharge and do various projects around the house." She stated her oldest daughter came to visit over the holidays, which she stated was enjoyable though she felt resentful her younger daughter did not visit as well. This moved into a discussion around her youngest daughter and [name] feeling cut off from her. Although, [name] was able to recognize her youngest daughter does not interact with others, so she was able to accept that it is not a direct attack on their relationship. [name] discussed frustration that her daughter does not live as a "real adult," and has not followed up with dentist appointments or other medical appointments. We discussed ways to communicate this with her daughter in a constructive, appropriate way. [name] expressed understanding of the best way to approach this dynamic with her daughter. She reported work is going well and she has accepted she either needs to "stay in my lane," or "move on to greener pastures." We discussed what that looked like and the subsequent feelings around it.   Suicidal/Homicidal: No  Therapist Response: Maureen Pena is able to accept input and insight from LCSW, and is able to speak openly about her emotions and symptoms. We will continue to utilize CBT moving forward to assist [name] in recognizing distorted thoughts and ways to manage irrational thoughts.   Plan: Return again in 4 weeks.  Diagnosis: Axis I: MDD (recurrent, mild)    Axis II: No diagnosis    [name],  LCSW 06/30/2018

## 2018-07-28 ENCOUNTER — Ambulatory Visit: Payer: BLUE CROSS/BLUE SHIELD | Admitting: Licensed Clinical Social Worker

## 2018-07-28 ENCOUNTER — Encounter: Payer: Self-pay | Admitting: Licensed Clinical Social Worker

## 2018-07-28 DIAGNOSIS — F3341 Major depressive disorder, recurrent, in partial remission: Secondary | ICD-10-CM | POA: Diagnosis not present

## 2018-07-28 NOTE — Progress Notes (Signed)
   THERAPIST PROGRESS NOTE  Session Time: 6808-8110  Participation Level: Active  Behavioral Response: Well GroomedAlertNA  Type of Therapy: Individual Therapy  Treatment Goals addressed: Coping  Interventions: Supportive  Summary: Maureen Pena is a 63 y.o. female who presents with continued symptoms of her diagnosis. Maureen Pena reports doing well since our last session. She reported she quit her job on Monday (07/21/18), and they told her she only needed to work a three day notice--and that she was to do that from home. Maureen Pena reported, ultimately, feeling a sense of relief after quitting her job, though felt some anxiety around the narrative her boss may be able to spin without her being there to speak with her co-workers. Maureen Pena reported quitting her job after they asked her to do a second drug test in two and a half months, "I knew I was going to pass it but it was just the principle of the thing--and the HR lady had a smirk on her face about it." Maureen Pena reported being "vocal," about her feelings around the drug test, and reported being placed on a behavioral improvement plan. That was when she put in her notice. She reported feeling the company was targeting her, but was able to take a step back and recognize it was not personal. Maureen Pena stated she interviewed for a job on 07/25/18 at [employer] which she was very enthusiastic about. She reported feeling optimistic and hoping she lands that opportunity. Maureen Pena validated Maureen Pena's feelings around quitting her job and interviewing for a new one, and encouraged her to continue reframing thoughts into more positive ones. Maureen Pena reported things with her daughters are going well, and her younger daughter even mentioned coming to visit her--which Maureen Pena was appreciative of.   Suicidal/Homicidal: No  Therapist Response: Maureen Pena was able to speak openly about her feelings and emotional regulation. She continues to work  towards her treatment goals but has not yet reached them. Maureen Pena is able to use skills learned in each session and apply them as needed in daily life. We will continue to use a variety of frameworks to assist Maureen Pena in managing emotional symptoms.   Plan: Return again in 4 weeks.  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

## 2018-08-22 ENCOUNTER — Telehealth: Payer: Self-pay

## 2018-08-22 NOTE — Telephone Encounter (Signed)
Copied from [place] [phone] [phone]. Topic: Appointment Scheduling - Scheduling Inquiry for Clinic >> Aug 22, 2018  9:53 AM Ahmed Prima L wrote: Reason for CRM: Patient said Dr Aundra Dubin wants her to have a pneumonia vaccine and so she wants to know should she wait until this coronavirus blows over or come on in?

## 2018-08-26 NOTE — Telephone Encounter (Signed)
She can wait on pneumonia 23 vaccine it is not urgent  Schedule when ready with nurse visit can sch in the next 3-6 months   [place]

## 2018-08-26 NOTE — Telephone Encounter (Signed)
Pt hasn't heard anything in regards to her question about the pneumonia vaccine.  Pt calling again to check on this.

## 2018-08-29 NOTE — Telephone Encounter (Signed)
Left message for patient to return call back. [facility] may give and obtain information.  

## 2018-08-29 NOTE — Telephone Encounter (Signed)
Attempted to call patient regarding PCP response to question- left message to call office back.

## 2018-09-01 ENCOUNTER — Encounter: Payer: Self-pay | Admitting: Internal Medicine

## 2018-09-01 ENCOUNTER — Ambulatory Visit: Payer: BLUE CROSS/BLUE SHIELD | Admitting: Licensed Clinical Social Worker

## 2018-09-01 NOTE — Telephone Encounter (Signed)
Pt called back and message given to pt from Drr. Terese Door 08/26/18. Pt verbalized understanding.

## 2018-09-01 NOTE — Telephone Encounter (Signed)
Opened in error  This encounter was created in error - please disregard. 

## 2018-09-15 ENCOUNTER — Ambulatory Visit: Payer: BLUE CROSS/BLUE SHIELD | Admitting: Psychiatry

## 2018-12-04 ENCOUNTER — Other Ambulatory Visit: Payer: Self-pay | Admitting: Psychiatry

## 2018-12-04 DIAGNOSIS — F3341 Major depressive disorder, recurrent, in partial remission: Secondary | ICD-10-CM

## 2019-03-05 ENCOUNTER — Telehealth: Payer: Self-pay | Admitting: Internal Medicine

## 2019-03-05 DIAGNOSIS — E039 Hypothyroidism, unspecified: Secondary | ICD-10-CM

## 2019-03-05 MED ORDER — LEVOTHYROXINE SODIUM 125 MCG PO TABS: 125.0000 ug | ORAL_TABLET | Freq: Every day | ORAL | 1 refills | Status: DC

## 2019-03-05 NOTE — Telephone Encounter (Signed)
rx refill levothyroxine (SYNTHROID, LEVOTHROID) 125 MCG tablet   PHARMACY [id number] Lorina Rabon, [facility] - [place]  [place] [address]  Phone: [phone] Fax: [phone]

## 2019-03-06 ENCOUNTER — Other Ambulatory Visit: Payer: Self-pay | Admitting: Internal Medicine

## 2019-03-06 ENCOUNTER — Telehealth: Payer: Self-pay | Admitting: Internal Medicine

## 2019-03-06 DIAGNOSIS — E039 Hypothyroidism, unspecified: Secondary | ICD-10-CM

## 2019-03-06 DIAGNOSIS — F419 Anxiety disorder, unspecified: Secondary | ICD-10-CM

## 2019-03-06 DIAGNOSIS — Z1389 Encounter for screening for other disorder: Secondary | ICD-10-CM

## 2019-03-06 DIAGNOSIS — E559 Vitamin D deficiency, unspecified: Secondary | ICD-10-CM

## 2019-03-06 DIAGNOSIS — F32A Depression, unspecified: Secondary | ICD-10-CM

## 2019-03-06 DIAGNOSIS — Z13220 Encounter for screening for lipoid disorders: Secondary | ICD-10-CM

## 2019-03-06 DIAGNOSIS — F329 Major depressive disorder, single episode, unspecified: Secondary | ICD-10-CM

## 2019-03-06 DIAGNOSIS — Z0000 Encounter for general adult medical examination without abnormal findings: Secondary | ICD-10-CM

## 2019-03-06 MED ORDER — LEVOTHYROXINE SODIUM 125 MCG PO TABS: 125.0000 ug | ORAL_TABLET | Freq: Every day | ORAL | 0 refills | Status: DC

## 2019-03-06 NOTE — Telephone Encounter (Signed)
Please call pt sch  Fasting lab appt   AND   Annual physical appt with me   Thanks  [place]

## 2019-03-13 ENCOUNTER — Telehealth: Payer: Self-pay | Admitting: Internal Medicine

## 2019-03-13 NOTE — Telephone Encounter (Signed)
I called pt and left vm to call ofc to schedule  Please call pt sch  Fasting lab appt   AND   Annual physical appt with me

## 2019-04-20 ENCOUNTER — Ambulatory Visit (INDEPENDENT_AMBULATORY_CARE_PROVIDER_SITE_OTHER): Payer: BC Managed Care – PPO

## 2019-04-20 ENCOUNTER — Other Ambulatory Visit: Payer: Self-pay

## 2019-04-20 DIAGNOSIS — Z23 Encounter for immunization: Secondary | ICD-10-CM | POA: Diagnosis not present

## 2019-04-28 ENCOUNTER — Telehealth: Payer: Self-pay | Admitting: *Deleted

## 2019-04-28 ENCOUNTER — Other Ambulatory Visit: Payer: Self-pay | Admitting: Internal Medicine

## 2019-04-28 DIAGNOSIS — Z1231 Encounter for screening mammogram for malignant neoplasm of breast: Secondary | ICD-10-CM

## 2019-04-28 NOTE — Telephone Encounter (Signed)
There needs to be an order and please make sure pt has f/u sch as it has been > 1 year this month Pt can call [facility] back to schedule mammo Check on appt here?

## 2019-04-28 NOTE — Telephone Encounter (Signed)
Copied from [place] [phone]. Topic: General - Other >> Apr 28, 2019 10:30 AM Leward Quan A wrote: Reason for CRM: Patient called to inform Dr Olivia Mackie that she called [facility] to schedule her mammogram and they told her that they need authorization from Dr Olivia Mackie in order to do the mammogram. Please advise Patient Ph# [phone]

## 2019-04-29 NOTE — Telephone Encounter (Signed)
Left message for patient to return call back. [facility] may give and obtain information.  

## 2019-05-11 ENCOUNTER — Encounter: Payer: Self-pay | Admitting: Internal Medicine

## 2019-05-15 ENCOUNTER — Other Ambulatory Visit: Payer: Self-pay | Admitting: Psychiatry

## 2019-05-15 DIAGNOSIS — F3341 Major depressive disorder, recurrent, in partial remission: Secondary | ICD-10-CM

## 2019-05-15 NOTE — Telephone Encounter (Signed)
Sent Lexapro to pharmacy - however needs appointment prior to future refills

## 2019-05-21 ENCOUNTER — Ambulatory Visit
Admission: RE | Admit: 2019-05-21 | Discharge: 2019-05-21 | Disposition: A | Discharge status: Home / Self Care | Payer: BC Managed Care – PPO | Source: Ambulatory Visit | Attending: Internal Medicine | Admitting: Internal Medicine

## 2019-05-21 DIAGNOSIS — Z1231 Encounter for screening mammogram for malignant neoplasm of breast: Secondary | ICD-10-CM | POA: Diagnosis present

## 2019-08-07 ENCOUNTER — Other Ambulatory Visit: Payer: Self-pay | Admitting: Psychiatry

## 2019-08-07 DIAGNOSIS — F3341 Major depressive disorder, recurrent, in partial remission: Secondary | ICD-10-CM

## 2019-08-17 ENCOUNTER — Other Ambulatory Visit: Payer: Self-pay

## 2019-08-19 ENCOUNTER — Inpatient Hospital Stay
Admission: EM | Admit: 2019-08-19 | Discharge: 2019-08-21 | DRG: 282 | Disposition: A | Discharge status: Home / Self Care | Payer: BC Managed Care – PPO | Source: Ambulatory Visit | Attending: Internal Medicine | Admitting: Internal Medicine

## 2019-08-19 ENCOUNTER — Telehealth: Payer: Self-pay | Admitting: Internal Medicine

## 2019-08-19 ENCOUNTER — Emergency Department: Payer: BC Managed Care – PPO

## 2019-08-19 ENCOUNTER — Encounter: Payer: Self-pay | Admitting: Emergency Medicine

## 2019-08-19 ENCOUNTER — Encounter: Payer: Self-pay | Admitting: Internal Medicine

## 2019-08-19 ENCOUNTER — Ambulatory Visit: Payer: BC Managed Care – PPO | Admitting: Internal Medicine

## 2019-08-19 ENCOUNTER — Other Ambulatory Visit: Payer: Self-pay

## 2019-08-19 ENCOUNTER — Ambulatory Visit (INDEPENDENT_AMBULATORY_CARE_PROVIDER_SITE_OTHER): Payer: BC Managed Care – PPO

## 2019-08-19 VITALS — BP 110/70 | HR 88 | Temp 97.7°F | Ht 66.0 in | Wt 178.4 lb

## 2019-08-19 DIAGNOSIS — Z79890 Hormone replacement therapy (postmenopausal): Secondary | ICD-10-CM

## 2019-08-19 DIAGNOSIS — I219 Acute myocardial infarction, unspecified: Secondary | ICD-10-CM

## 2019-08-19 DIAGNOSIS — Z818 Family history of other mental and behavioral disorders: Secondary | ICD-10-CM

## 2019-08-19 DIAGNOSIS — I2510 Atherosclerotic heart disease of native coronary artery without angina pectoris: Secondary | ICD-10-CM | POA: Diagnosis present

## 2019-08-19 DIAGNOSIS — E119 Type 2 diabetes mellitus without complications: Secondary | ICD-10-CM | POA: Diagnosis present

## 2019-08-19 DIAGNOSIS — R079 Chest pain, unspecified: Secondary | ICD-10-CM

## 2019-08-19 DIAGNOSIS — H6123 Impacted cerumen, bilateral: Secondary | ICD-10-CM | POA: Insufficient documentation

## 2019-08-19 DIAGNOSIS — Z7189 Other specified counseling: Secondary | ICD-10-CM

## 2019-08-19 DIAGNOSIS — R946 Abnormal results of thyroid function studies: Secondary | ICD-10-CM | POA: Diagnosis not present

## 2019-08-19 DIAGNOSIS — I2119 ST elevation (STEMI) myocardial infarction involving other coronary artery of inferior wall: Principal | ICD-10-CM | POA: Diagnosis present

## 2019-08-19 DIAGNOSIS — I2111 ST elevation (STEMI) myocardial infarction involving right coronary artery: Secondary | ICD-10-CM | POA: Diagnosis not present

## 2019-08-19 DIAGNOSIS — F17210 Nicotine dependence, cigarettes, uncomplicated: Secondary | ICD-10-CM | POA: Diagnosis present

## 2019-08-19 DIAGNOSIS — E039 Hypothyroidism, unspecified: Secondary | ICD-10-CM | POA: Diagnosis not present

## 2019-08-19 DIAGNOSIS — K449 Diaphragmatic hernia without obstruction or gangrene: Secondary | ICD-10-CM

## 2019-08-19 DIAGNOSIS — I213 ST elevation (STEMI) myocardial infarction of unspecified site: Secondary | ICD-10-CM

## 2019-08-19 DIAGNOSIS — F418 Other specified anxiety disorders: Secondary | ICD-10-CM | POA: Diagnosis not present

## 2019-08-19 DIAGNOSIS — F39 Unspecified mood [affective] disorder: Secondary | ICD-10-CM | POA: Diagnosis present

## 2019-08-19 DIAGNOSIS — E559 Vitamin D deficiency, unspecified: Secondary | ICD-10-CM | POA: Diagnosis not present

## 2019-08-19 DIAGNOSIS — Z720 Tobacco use: Secondary | ICD-10-CM | POA: Diagnosis not present

## 2019-08-19 DIAGNOSIS — I2584 Coronary atherosclerosis due to calcified coronary lesion: Secondary | ICD-10-CM | POA: Diagnosis present

## 2019-08-19 DIAGNOSIS — F419 Anxiety disorder, unspecified: Secondary | ICD-10-CM | POA: Diagnosis present

## 2019-08-19 DIAGNOSIS — E785 Hyperlipidemia, unspecified: Secondary | ICD-10-CM

## 2019-08-19 DIAGNOSIS — F32A Depression, unspecified: Secondary | ICD-10-CM | POA: Diagnosis present

## 2019-08-19 DIAGNOSIS — Z841 Family history of disorders of kidney and ureter: Secondary | ICD-10-CM

## 2019-08-19 DIAGNOSIS — Z20822 Contact with and (suspected) exposure to covid-19: Secondary | ICD-10-CM | POA: Diagnosis present

## 2019-08-19 DIAGNOSIS — Z1389 Encounter for screening for other disorder: Secondary | ICD-10-CM | POA: Diagnosis not present

## 2019-08-19 DIAGNOSIS — F329 Major depressive disorder, single episode, unspecified: Secondary | ICD-10-CM | POA: Diagnosis present

## 2019-08-19 DIAGNOSIS — Z823 Family history of stroke: Secondary | ICD-10-CM

## 2019-08-19 DIAGNOSIS — I25119 Atherosclerotic heart disease of native coronary artery with unspecified angina pectoris: Secondary | ICD-10-CM | POA: Diagnosis present

## 2019-08-19 DIAGNOSIS — Z79899 Other long term (current) drug therapy: Secondary | ICD-10-CM

## 2019-08-19 DIAGNOSIS — Z8249 Family history of ischemic heart disease and other diseases of the circulatory system: Secondary | ICD-10-CM

## 2019-08-19 DIAGNOSIS — Z825 Family history of asthma and other chronic lower respiratory diseases: Secondary | ICD-10-CM

## 2019-08-19 HISTORY — DX: ST elevation (STEMI) myocardial infarction of unspecified site: I21.3

## 2019-08-19 LAB — COMPREHENSIVE METABOLIC PANEL
ALT: 31 U/L (ref 0–35)
ALT: 28 U/L (ref 0–44)
AST: 56 U/L — ABNORMAL HIGH (ref 0–37)
AST: 48 U/L — ABNORMAL HIGH (ref 15–41)
Albumin: 4.1 g/dL (ref 3.5–5.2)
Albumin: 3.9 g/dL (ref 3.5–5.0)
Alkaline Phosphatase: 92 U/L (ref 39–117)
Alkaline Phosphatase: 76 U/L (ref 38–126)
Anion gap: 11 (ref 5–15)
BUN: 20 mg/dL (ref 6–23)
BUN: 22 mg/dL (ref 8–23)
CO2: 27 mEq/L (ref 19–32)
CO2: 24 mmol/L (ref 22–32)
Calcium: 10 mg/dL (ref 8.4–10.5)
Calcium: 9.3 mg/dL (ref 8.9–10.3)
Chloride: 101 mEq/L (ref 96–112)
Chloride: 102 mmol/L (ref 98–111)
Creatinine, Ser: 0.98 mg/dL (ref 0.40–1.20)
Creatinine, Ser: 1 mg/dL (ref 0.44–1.00)
GFR calc Af Amer: >60 mL/min (ref >60)
GFR calc non Af Amer: >60 mL/min (ref >60)
GFR: 57.47 mL/min — ABNORMAL LOW (ref >60.00)
Glucose, Bld: 94 mg/dL (ref 70–99)
Glucose, Bld: 108 mg/dL — ABNORMAL HIGH (ref 70–99)
Potassium: 4.4 mEq/L (ref 3.5–5.1)
Potassium: 3.8 mmol/L (ref 3.5–5.1)
Sodium: 137 mEq/L (ref 135–145)
Sodium: 137 mmol/L (ref 135–145)
Total Bilirubin: 0.8 mg/dL (ref 0.2–1.2)
Total Bilirubin: 0.6 mg/dL (ref 0.3–1.2)
Total Protein: 6.9 g/dL (ref 6.0–8.3)
Total Protein: 6.9 g/dL (ref 6.5–8.1)

## 2019-08-19 LAB — CBC WITH DIFFERENTIAL/PLATELET
Basophils Absolute: 0.1 10*3/uL (ref 0.0–0.1)
Basophils Relative: 0.5 % (ref 0.0–3.0)
Eosinophils Absolute: 0.1 10*3/uL (ref 0.0–0.7)
Eosinophils Relative: 0.9 % (ref 0.0–5.0)
HCT: 45 % (ref 36.0–46.0)
Hemoglobin: 14.9 g/dL (ref 12.0–15.0)
Lymphocytes Relative: 22.9 % (ref 12.0–46.0)
Lymphs Abs: 3.6 10*3/uL (ref 0.7–4.0)
MCHC: 33 g/dL (ref 30.0–36.0)
MCV: 92.4 fl (ref 78.0–100.0)
Monocytes Absolute: 1.2 10*3/uL — ABNORMAL HIGH (ref 0.1–1.0)
Monocytes Relative: 7.6 % (ref 3.0–12.0)
Neutro Abs: 10.7 10*3/uL — ABNORMAL HIGH (ref 1.4–7.7)
Neutrophils Relative %: 68.1 % (ref 43.0–77.0)
Platelets: 224 10*3/uL (ref 150.0–400.0)
RBC: 4.87 Mil/uL (ref 3.87–5.11)
RDW: 14.2 % (ref 11.5–15.5)
WBC: 15.7 10*3/uL — ABNORMAL HIGH (ref 4.0–10.5)

## 2019-08-19 LAB — PROTIME-INR
INR: 0.9 (ref 0.8–1.2)
Prothrombin Time: 12 seconds (ref 11.4–15.2)

## 2019-08-19 LAB — CBC
HCT: 45.7 % (ref 36.0–46.0)
Hemoglobin: 15.1 g/dL — ABNORMAL HIGH (ref 12.0–15.0)
MCH: 31.1 pg (ref 26.0–34.0)
MCHC: 33 g/dL (ref 30.0–36.0)
MCV: 94 fL (ref 80.0–100.0)
Platelets: 227 10*3/uL (ref 150–400)
RBC: 4.86 MIL/uL (ref 3.87–5.11)
RDW: 13.4 % (ref 11.5–15.5)
WBC: 13.8 10*3/uL — ABNORMAL HIGH (ref 4.0–10.5)
nRBC: 0 % (ref 0.0–0.2)

## 2019-08-19 LAB — APTT: aPTT: 29 seconds (ref 24–36)

## 2019-08-19 LAB — VITAMIN D 25 HYDROXY (VIT D DEFICIENCY, FRACTURES): VITD: 65.08 ng/mL (ref 30.00–100.00)

## 2019-08-19 LAB — TSH: TSH: 0.85 u[IU]/mL (ref 0.35–4.50)

## 2019-08-19 LAB — TROPONIN I (HIGH SENSITIVITY): Troponin I (High Sensitivity): 10525 ng/L (ref <18)

## 2019-08-19 LAB — THYROID PEROXIDASE ANTIBODY: Thyroperoxidase Ab SerPl-aCnc: 76 IU/mL — ABNORMAL HIGH (ref 0–34)

## 2019-08-19 LAB — T4, FREE: Free T4: 1.19 ng/dL (ref 0.60–1.60)

## 2019-08-19 LAB — SPECIMEN STATUS REPORT

## 2019-08-19 MED ORDER — FAMOTIDINE 20 MG PO TABS
20.0000 mg | ORAL_TABLET | Freq: Two times a day (BID) | ORAL | Status: DC
  Filled 2019-08-19 (×2): qty 1

## 2019-08-19 MED ORDER — ESCITALOPRAM OXALATE 10 MG PO TABS
10.0000 mg | ORAL_TABLET | Freq: Every day | ORAL | Status: DC
  Administered 2019-08-21: 10 mg via ORAL
  Filled 2019-08-19 (×2): qty 1

## 2019-08-19 MED ORDER — ATORVASTATIN CALCIUM 80 MG PO TABS
80.0000 mg | ORAL_TABLET | Freq: Every day | ORAL | Status: DC
  Administered 2019-08-19 – 2019-08-20 (×2): 80 mg via ORAL
  Filled 2019-08-19: qty 4
  Filled 2019-08-19: qty 1

## 2019-08-19 MED ORDER — OMEGA-3-ACID ETHYL ESTERS 1 G PO CAPS
1000.0000 mg | ORAL_CAPSULE | Freq: Every day | ORAL | Status: DC
  Administered 2019-08-21: 1000 mg via ORAL
  Filled 2019-08-19: qty 1

## 2019-08-19 MED ORDER — ASPIRIN EC 325 MG PO TBEC
325.0000 mg | DELAYED_RELEASE_TABLET | Freq: Every day | ORAL | Status: DC
  Administered 2019-08-20: 325 mg via ORAL
  Filled 2019-08-19: qty 1

## 2019-08-19 MED ORDER — ONDANSETRON HCL 4 MG/2ML IJ SOLN: 4.0000 mg | Freq: Four times a day (QID) | INTRAMUSCULAR | Status: DC | PRN

## 2019-08-19 MED ORDER — HEPARIN BOLUS VIA INFUSION
4000.0000 [IU] | Freq: Once | INTRAVENOUS | Status: AC
  Administered 2019-08-19: 4000 [IU] via INTRAVENOUS
  Filled 2019-08-19: qty 4000

## 2019-08-19 MED ORDER — METOPROLOL TARTRATE 25 MG PO TABS
12.5000 mg | ORAL_TABLET | Freq: Two times a day (BID) | ORAL | Status: DC
  Filled 2019-08-19 (×2): qty 1

## 2019-08-19 MED ORDER — NITROGLYCERIN 0.4 MG SL SUBL: 0.4000 mg | SUBLINGUAL_TABLET | SUBLINGUAL | Status: DC | PRN

## 2019-08-19 MED ORDER — HEPARIN (PORCINE) 25000 UT/250ML-% IV SOLN
900.0000 [IU]/h | INTRAVENOUS | Status: DC
  Administered 2019-08-19: 900 [IU]/h via INTRAVENOUS
  Filled 2019-08-19: qty 250

## 2019-08-19 MED ORDER — ASPIRIN 81 MG PO CHEW
CHEWABLE_TABLET | ORAL | Status: AC
  Administered 2019-08-19: 324 mg via ORAL
  Filled 2019-08-19: qty 4

## 2019-08-19 MED ORDER — LEVOTHYROXINE SODIUM 25 MCG PO TABS
125.0000 ug | ORAL_TABLET | Freq: Every day | ORAL | Status: DC
  Administered 2019-08-20 – 2019-08-21 (×2): 125 ug via ORAL
  Filled 2019-08-19 (×2): qty 1

## 2019-08-19 MED ORDER — SODIUM CHLORIDE 0.9 % IV SOLN: INTRAVENOUS | Status: DC

## 2019-08-19 MED ORDER — ACETAMINOPHEN 325 MG PO TABS: 650.0000 mg | ORAL_TABLET | ORAL | Status: DC | PRN

## 2019-08-19 MED ORDER — ZOLPIDEM TARTRATE 5 MG PO TABS: 5.0000 mg | ORAL_TABLET | Freq: Every evening | ORAL | Status: DC | PRN

## 2019-08-19 MED ORDER — ASPIRIN 81 MG PO CHEW: 324.0000 mg | CHEWABLE_TABLET | Freq: Once | ORAL | Status: AC

## 2019-08-19 NOTE — Plan of Care (Signed)
  Problem: Pain Managment: Goal: General experience of comfort will improve Outcome: Progressing  Denies pain on arrival to floor.  Voices compliancy to notify staff if chest pain occurs.

## 2019-08-19 NOTE — Telephone Encounter (Signed)
Maureen Pena from [facility] calling in STAT results:  D-Dimer = 0.84 which was flagged as high  Troponin I = 23.70 which is critical result   Please advise

## 2019-08-19 NOTE — Telephone Encounter (Signed)
D dimer and Troponin elevated called pt re establish with [facility] in future for now go to the ED and consult [place] cards and w/u elevated troponin and D dimer in the ED   Called ED triage nurse to inform pt is coming   [place]

## 2019-08-19 NOTE — Patient Instructions (Addendum)
Aspirin 81 mg daily  Consider statin check cholesterol future  Cholesterol Content in Foods Cholesterol is a waxy, fat-like substance that helps to carry fat in the blood. The body needs cholesterol in small amounts, but too much cholesterol can cause damage to the arteries and heart. Most people should eat less than 200 milligrams (mg) of cholesterol a day. Foods with cholesterol  Cholesterol is found in animal-based foods, such as meat, seafood, and dairy. Generally, low-fat dairy and lean meats have less cholesterol than full-fat dairy and fatty meats. The milligrams of cholesterol per serving (mg per serving) of common cholesterol-containing foods are listed below. Meat and other proteins  Egg -- one large whole egg has 186 mg.  Veal shank -- 4 oz has 141 mg.  Lean ground [place] (93% lean) -- 4 oz has 118 mg.  Fat-trimmed lamb loin -- 4 oz has 106 mg.  Lean ground beef (90% lean) -- 4 oz has 100 mg.  Lobster -- 3.5 oz has 90 mg.  Pork loin chops -- 4 oz has 86 mg.  Canned salmon -- 3.5 oz has 83 mg.  Fat-trimmed beef top loin -- 4 oz has 78 mg.  Frankfurter -- 1 frank (3.5 oz) has 77 mg.  Crab -- 3.5 oz has 71 mg.  Roasted chicken without skin, white meat -- 4 oz has 66 mg.  Light bologna -- 2 oz has 45 mg.  Deli-cut [place] -- 2 oz has 31 mg.  Canned tuna -- 3.5 oz has 31 mg.  Berniece Salines -- 1 oz has 29 mg.  Oysters and mussels (raw) -- 3.5 oz has 25 mg.  Mackerel -- 1 oz has 22 mg.  Trout -- 1 oz has 20 mg.  Pork sausage -- 1 link (1 oz) has 17 mg.  Salmon -- 1 oz has 16 mg.  Tilapia -- 1 oz has 14 mg. Dairy  Soft-serve ice cream --  cup (4 oz) has 103 mg.  Whole-milk yogurt -- 1 cup (8 oz) has 29 mg.  Cheddar cheese -- 1 oz has 28 mg.  American cheese -- 1 oz has 28 mg.  Whole milk -- 1 cup (8 oz) has 23 mg.  2% milk -- 1 cup (8 oz) has 18 mg.  Cream cheese -- 1 tablespoon (Tbsp) has 15 mg.  Cottage cheese --  cup (4 oz) has 14 mg.  Low-fat  (1%) milk -- 1 cup (8 oz) has 10 mg.  Sour cream -- 1 Tbsp has 8.5 mg.  Low-fat yogurt -- 1 cup (8 oz) has 8 mg.  Nonfat Greek yogurt -- 1 cup (8 oz) has 7 mg.  Half-and-half cream -- 1 Tbsp has 5 mg. Fats and oils  Cod liver oil -- 1 tablespoon (Tbsp) has 82 mg.  Butter -- 1 Tbsp has 15 mg.  Lard -- 1 Tbsp has 14 mg.  Bacon grease -- 1 Tbsp has 14 mg.  Mayonnaise -- 1 Tbsp has 5-10 mg.  Margarine -- 1 Tbsp has 3-10 mg. Exact amounts of cholesterol in these foods may vary depending on specific ingredients and brands. Foods without cholesterol Most plant-based foods do not have cholesterol unless you combine them with a food that has cholesterol. Foods without cholesterol include:  Grains and cereals.  Vegetables.  Fruits.  Vegetable oils, such as olive, canola, and sunflower oil.  Legumes, such as peas, beans, and lentils.  Nuts and seeds.  Egg whites. Summary  The body needs cholesterol in small amounts, but too  much cholesterol can cause damage to the arteries and heart.  Most people should eat less than 200 milligrams (mg) of cholesterol a day. This information is not intended to replace advice given to you by your health care provider. Make sure you discuss any questions you have with your health care provider. Document Revised: 05/10/2017 Document Reviewed: 01/22/2017 Elsevier Patient Education  [place].  Cholesterol Content in Foods Cholesterol is a waxy, fat-like substance that helps to carry fat in the blood. The body needs cholesterol in small amounts, but too much cholesterol can cause damage to the arteries and heart. Most people should eat less than 200 milligrams (mg) of cholesterol a day. Foods with cholesterol  Cholesterol is found in animal-based foods, such as meat, seafood, and dairy. Generally, low-fat dairy and lean meats have less cholesterol than full-fat dairy and fatty meats. The milligrams of cholesterol per serving (mg per  serving) of common cholesterol-containing foods are listed below. Meat and other proteins  Egg -- one large whole egg has 186 mg.  Veal shank -- 4 oz has 141 mg.  Lean ground [place] (93% lean) -- 4 oz has 118 mg.  Fat-trimmed lamb loin -- 4 oz has 106 mg.  Lean ground beef (90% lean) -- 4 oz has 100 mg.  Lobster -- 3.5 oz has 90 mg.  Pork loin chops -- 4 oz has 86 mg.  Canned salmon -- 3.5 oz has 83 mg.  Fat-trimmed beef top loin -- 4 oz has 78 mg.  Frankfurter -- 1 frank (3.5 oz) has 77 mg.  Crab -- 3.5 oz has 71 mg.  Roasted chicken without skin, white meat -- 4 oz has 66 mg.  Light bologna -- 2 oz has 45 mg.  Deli-cut [place] -- 2 oz has 31 mg.  Canned tuna -- 3.5 oz has 31 mg.  Berniece Salines -- 1 oz has 29 mg.  Oysters and mussels (raw) -- 3.5 oz has 25 mg.  Mackerel -- 1 oz has 22 mg.  Trout -- 1 oz has 20 mg.  Pork sausage -- 1 link (1 oz) has 17 mg.  Salmon -- 1 oz has 16 mg.  Tilapia -- 1 oz has 14 mg. Dairy  Soft-serve ice cream --  cup (4 oz) has 103 mg.  Whole-milk yogurt -- 1 cup (8 oz) has 29 mg.  Cheddar cheese -- 1 oz has 28 mg.  American cheese -- 1 oz has 28 mg.  Whole milk -- 1 cup (8 oz) has 23 mg.  2% milk -- 1 cup (8 oz) has 18 mg.  Cream cheese -- 1 tablespoon (Tbsp) has 15 mg.  Cottage cheese --  cup (4 oz) has 14 mg.  Low-fat (1%) milk -- 1 cup (8 oz) has 10 mg.  Sour cream -- 1 Tbsp has 8.5 mg.  Low-fat yogurt -- 1 cup (8 oz) has 8 mg.  Nonfat Greek yogurt -- 1 cup (8 oz) has 7 mg.  Half-and-half cream -- 1 Tbsp has 5 mg. Fats and oils  Cod liver oil -- 1 tablespoon (Tbsp) has 82 mg.  Butter -- 1 Tbsp has 15 mg.  Lard -- 1 Tbsp has 14 mg.  Bacon grease -- 1 Tbsp has 14 mg.  Mayonnaise -- 1 Tbsp has 5-10 mg.  Margarine -- 1 Tbsp has 3-10 mg. Exact amounts of cholesterol in these foods may vary depending on specific ingredients and brands. Foods without cholesterol Most plant-based foods do not have cholesterol  unless  you combine them with a food that has cholesterol. Foods without cholesterol include:  Grains and cereals.  Vegetables.  Fruits.  Vegetable oils, such as olive, canola, and sunflower oil.  Legumes, such as peas, beans, and lentils.  Nuts and seeds.  Egg whites. Summary  The body needs cholesterol in small amounts, but too much cholesterol can cause damage to the arteries and heart.  Most people should eat less than 200 milligrams (mg) of cholesterol a day. This information is not intended to replace advice given to you by your health care provider. Make sure you discuss any questions you have with your health care provider. Document Revised: 05/10/2017 Document Reviewed: 01/22/2017 Elsevier Patient Education  [place].    Angina  Angina is extreme discomfort in the chest, neck, arm, jaw, or back. The discomfort is caused by a lack of blood in the middle layer of the heart wall (myocardium). There are four types of angina:  Stable angina. This is triggered by vigorous activity or exercise. It goes away when you rest or take angina medicine.  Unstable angina. This is a warning sign and can lead to a heart attack (acute coronary syndrome). This is a medical emergency. Symptoms come at rest and last a long time.  Microvascular angina. This affects the small coronary arteries. Symptoms include feeling tired and being short of breath.  Prinzmetal or variant angina. This is caused by a tightening (spasm) of the arteries that go to your heart. What are the causes? This condition is caused by atherosclerosis. This is the buildup of fat and cholesterol (plaque) in your arteries. The plaque may narrow or block the artery. Other causes of angina include:  Sudden tightening of the muscles of the arteries in the heart (coronary spasm).  Small artery disease (microvascular dysfunction).  Problems with any of your heart valves (heart valve disease).  A tear in an  artery in your heart (coronary artery dissection).  Diseases of the heart muscle (cardiomyopathy), or other heart diseases. What increases the risk? You are more likely to develop this condition if you have:  High cholesterol.  High blood pressure (hypertension).  Diabetes.  A family history of heart disease.  An inactive (sedentary) lifestyle, or you do not exercise enough.  Depression.  Had radiation treatment to the left side of your chest. Other risk factors include:  Using tobacco.  Being obese.  Eating a diet high in saturated fats.  Being exposed to high stress or triggers of stress.  Using drugs, such as cocaine. Women have a greater risk for angina if:  They are older than 65.  They have gone through menopause (are postmenopausal). What are the signs or symptoms? Common symptoms of this condition in both men and women may include:  Chest pain, which may: ? Feel like a crushing or squeezing in the chest, or like a tightness, pressure, fullness, or heaviness in the chest. ? Last for more than a few minutes at a time, or it may stop and come back (recur) over the course of a few minutes.  Pain in the neck, arm, jaw, or back.  Unexplained heartburn or indigestion.  Shortness of breath.  Nausea.  Sudden cold sweats. Women and people with diabetes may have unusual (atypical) symptoms, such as:  Fatigue.  Unexplained feelings of nervousness or anxiety.  Unexplained weakness.  Dizziness or fainting. How is this diagnosed? This condition may be diagnosed based on:  Your symptoms and medical history.  Electrocardiogram (ECG) to  measure the electrical activity in your heart.  Blood tests.  Stress test to look for signs of blockage when your heart is stressed.  CT angiogram to examine your heart and the blood flow to it.  Coronary angiogram to check your coronary arteries for blockage. How is this treated? Angina may be treated  with:  Medicines to: ? Prevent blood clots and heart attack. ? Relax blood vessels and improve blood flow to the heart (nitrates). ? Reduce blood pressure, improve the pumping action of the heart, and relax blood vessels that are spasming. ? Reduce cholesterol and help treat atherosclerosis.  A procedure to widen a narrowed or blocked coronary artery (angioplasty). A mesh tube may be placed in a coronary artery to keep it open (coronary stenting).  Surgery to allow blood to go around a blocked artery (coronary artery bypass surgery). Follow these instructions at home: Medicines  Take over-the-counter and prescription medicines only as told by your health care provider.  Do not take the following medicines unless your health care provider approves: ? NSAIDs, such as ibuprofen or naproxen. ? Vitamin supplements that contain vitamin A, vitamin E, or both. ? Hormone replacement therapy that contains estrogen with or without progestin. Eating and drinking   Eat a heart-healthy diet. This includes plenty of fresh fruits and vegetables, whole grains, low-fat (lean) protein, and low-fat dairy products.  Follow instructions from your health care provider about eating or drinking restrictions. Activity  Follow an exercise program approved by your health care provider.  Consider joining a cardiac rehabilitation program.  Take a break when you feel fatigued. Plan rest periods in your daily activities. Lifestyle   Do not use any products that contain nicotine or tobacco, such as cigarettes, e-cigarettes, and chewing tobacco. If you need help quitting, ask your health care provider.  If your health care provider says you can drink alcohol: ? Limit how much you use to:  0-1 drink a day for nonpregnant women.  0-2 drinks a day for men. ? Be aware of how much alcohol is in your drink. In the [place] one drink equals one 12 oz bottle of beer (355 mL), one 5 oz glass of wine (148 mL), or one  1 oz glass of hard liquor (44 mL). General instructions  Maintain a healthy weight.  Learn to manage stress.  Keep your vaccinations up to date. Get the flu (influenza) vaccine every year.  Talk to your health care provider if you feel depressed. Take a depression screening test to see if you are at risk for depression.  Work with your health care provider to manage other health conditions, such as hypertension or diabetes.  Keep all follow-up visits as told by your health care provider. This is important. Get help right away if:  You have pain in your chest, neck, arm, jaw, or back, and the pain: ? Lasts more than a few minutes. ? Is recurring. ? Is not relieved by taking medicines under the tongue (sublingual nitroglycerin). ? Increases in intensity or frequency.  You have a lot of sweating without cause.  You have unexplained: ? Heartburn or indigestion. ? Shortness of breath or difficulty breathing. ? Nausea or vomiting. ? Fatigue. ? Feelings of nervousness or anxiety. ? Weakness.  You have sudden light-headedness or dizziness.  You faint. These symptoms may represent a serious problem that is an emergency. Do not wait to see if the symptoms will go away. Get medical help right away. Call your local emergency services (  911 in the [place] Do not drive yourself to the hospital. Summary  Angina is extreme discomfort in the chest, neck, arm, jaw, or back that is caused by a lack of blood in the heart wall.  There are many symptoms of angina. They include chest pain, unexplained heartburn or indigestion, sudden cold sweats, and fatigue.  Angina may be treated with behavioral changes, medicine, or surgery.  Symptoms of angina may represent an emergency. Get medical help right away. Call your local emergency services (911 in the [place] Do not drive yourself to the hospital. This information is not intended to replace advice given to you by your health care provider. Make  sure you discuss any questions you have with your health care provider. Document Revised: 01/13/2018 Document Reviewed: 01/13/2018 Elsevier Patient Education  [place].

## 2019-08-19 NOTE — Consult Note (Signed)
ANTICOAGULATION CONSULT NOTE  Pharmacy Consult for  Indication: ACS / STEMI  No Known Allergies  Patient Measurements: Height: 5\' 6"  (167.6 cm) Weight: 178 lb 6.4 oz (80.9 kg) IBW/kg (Calculated) : 59.3 Heparin Dosing Weight: 76.2 kg   Vital Signs: Temp: 99.6 F (37.6 C) (03/10 1840) Temp Source: Oral (03/10 1840) BP: 117/82 (03/10 1840) Pulse Rate: 96 (03/10 1840)  Labs: Recent Labs    08/19/19 1127 08/19/19 1850  HGB 14.9 15.1*  HCT 45.0 45.7  PLT 224.0 227  APTT WILL FOLLOW  --   LABPROT WILL FOLLOW  --   INR WILL FOLLOW  --   CREATININE 0.98  --     Estimated Creatinine Clearance: 63.8 mL/min (by C-G formula based on SCr of 0.98 mg/dL).   Medications:  Confirmed with chart review and ED nurse there were no PTA anticoagulants   Assessment: Pharmacy consulted for heparin dosing. CBC appropriate and other baseline labs were ordered and results pending.   Goal of Therapy:  Heparin level 0.3-0.7 units/ml Monitor platelets by anticoagulation protocol: Yes   Plan:  Baseline labs have been ordered  Heparin DW:  76.2 kg  Give 4000 units bolus x 1 Start heparin infusion at 900 units/hr Check anti-Xa level in 6 hours and daily while on heparin, per protocol Continue to monitor H&H and platelets   Takashi Korol R Tanylah Schnoebelen 08/19/2019,7:13 PM

## 2019-08-19 NOTE — ED Notes (Signed)
Pt pulled from triage line and taken to triage 3 for EKG.

## 2019-08-19 NOTE — ED Notes (Signed)
Pt states having chest pain that radiated into her central chest and into her shoulder blades on Sunday but none today. Pt denies shob and fevers but states she was kind of nauseas the next day.

## 2019-08-19 NOTE — Progress Notes (Signed)
Patient with new onset chest pain 08/16/2019 lasting one hour without recurrence. Patient sees primary care provider earlier today, noted to have elevate troponin and referred to ED, where ECG shows probable evolving inferior STEMI, onset > 72 hrs ago, no current CP, no indication for emergency cardiac cath. Rec heparin drip, metoprolol tartrate, ASA, statin, echo and probable cath this hospitalization.

## 2019-08-19 NOTE — ED Notes (Signed)
EKG preformed by this tech and interpreted by MD Siadecki. Pt a possible STEMI per EKG. Per MD Siadecki pt to be roomed STAT. Amy, charge RN made aware of situation. Pt transported to rm 6. MD Awilda Metro, RN, Megan, RN, Royce, RN, Amy, RN and this tech at bedside. Pt placed on cardiac monitor.

## 2019-08-19 NOTE — ED Notes (Signed)
Dr. Corky Downs alerted of critical troponin level by this nurse.

## 2019-08-19 NOTE — Progress Notes (Signed)
Chief Complaint  Patient presents with  . Chest Pain    First incident over a month ago of chest pain radiating into the shoulders, with tightness. Chest pain came back again Sunday night with 7/10 pain, tightness, and sharp upper back pain. No known injuries. No increased or irregular heart rate.    7/10 Chest pain 1 month ago mild and had chest tightness and like indigestion and resolved. On 08/16/19 had CP again radiating to back b/wn shoulder blades and was sore lasted 10-15 min. Chest was tight and felt like had to stretch it out but nothing tried. FH dad with cabg x 3 and she has h/o HLD. Prior EGD with small HH  Anxiety increased 2/2 CP and reduced appetite x 3 days   Review of Systems  Constitutional: Negative for weight loss.  Respiratory: Negative for shortness of breath.   Cardiovascular: Positive for chest pain.  Gastrointestinal: Negative for abdominal pain, nausea and vomiting.  Psychiatric/Behavioral: The patient is nervous/anxious.    Past Medical History:  Diagnosis Date  . Anxiety   . Barrett's esophagus   . Depression   . Diverticulitis   . History of chicken pox   . Thyroid disease    hypothyroidism  . UTI (urinary tract infection)    Past Surgical History:  Procedure Laterality Date  . ESOPHAGOGASTRODUODENOSCOPY (EGD) WITH PROPOFOL N/A 05/02/2018   Procedure: ESOPHAGOGASTRODUODENOSCOPY (EGD) WITH PROPOFOL;  Surgeon: Lucilla Lame, MD;  Location: [facility];  Service: Endoscopy;  Laterality: N/A;  . TUBAL LIGATION     Family History  Problem Relation Age of Onset  . Depression Mother   . Anxiety disorder Mother   . Schizophrenia Mother   . Paranoid behavior Mother   . Stroke Mother   . COPD Father   . Early death Father   . Hearing loss Father   . Heart disease Father   . CAD Father        s/p cabg x 3   . Depression Brother   . COPD Brother   . Hypertension Brother   . Kidney disease Brother   . Breast cancer Neg Hx    Social History    Socioeconomic History  . Marital status: Divorced    Spouse name: Not on file  . Number of children: 2  . Years of education: Not on file  . Highest education level: Bachelor's degree (e.g., BA, AB, BS)  Occupational History    Comment: part time  Tobacco Use  . Smoking status: Current Every Day Smoker    Packs/day: 0.50    Types: Cigarettes  . Smokeless tobacco: Never Used  Substance and Sexual Activity  . Alcohol use: Not Currently    Alcohol/week: 1.0 standard drinks    Types: 1 Glasses of wine per week  . Drug use: Yes    Types: Marijuana  . Sexual activity: Not Currently  Other Topics Concern  . Not on file  Social History Narrative   Kids    No guns    Wears seat belt    Safe in relationship    Smoker    Lives with Lexicographer    Social Determinants of Health   Financial Resource Strain:   . Difficulty of Paying Living Expenses: Not on file  Food Insecurity:   . Worried About Charity fundraiser in the Last Year: Not on file  . Ran Out of Food in the Last Year: Not on file  Transportation Needs:   .  Lack of Transportation (Medical): Not on file  . Lack of Transportation (Non-Medical): Not on file  Physical Activity:   . Days of Exercise per Week: Not on file  . Minutes of Exercise per Session: Not on file  Stress:   . Feeling of Stress : Not on file  Social Connections:   . Frequency of Communication with Friends and Family: Not on file  . Frequency of Social Gatherings with Friends and Family: Not on file  . Attends Religious Services: Not on file  . Active Member of Clubs or Organizations: Not on file  . Attends Archivist Meetings: Not on file  . Marital Status: Not on file  Intimate Partner Violence:   . Fear of Current or Ex-Partner: Not on file  . Emotionally Abused: Not on file  . Physically Abused: Not on file  . Sexually Abused: Not on file   Current Meds  Medication Sig  . Cholecalciferol (VITAMIN D-3) 5000 units TABS Take  by mouth.  . escitalopram (LEXAPRO) 10 MG tablet TAKE 1 TABLET BY MOUTH EVERY DAY  . hydrOXYzine (ATARAX/VISTARIL) 25 MG tablet TAKE 1 TABLET (25 MG TOTAL) BY MOUTH AT BEDTIME AS NEEDED (FOR SLEEP).  Marland Kitchen levothyroxine (SYNTHROID) 125 MCG tablet Take 1 tablet (125 mcg total) by mouth daily before breakfast. 30 minutes before breakfast. appt further refills  . meloxicam (MOBIC) 15 MG tablet meloxicam 15 mg tablet  Take 1 tablet every day by oral route.  . Omega-3 1000 MG CAPS Take by mouth.   No Known Allergies No results found for this or any previous visit (from the past 2160 hour(s)). Objective  Body mass index is 28.79 kg/m. Wt Readings from Last 3 Encounters:  08/19/19 178 lb 6.4 oz (80.9 kg)  05/02/18 164 lb (74.4 kg)  04/11/18 163 lb 12.8 oz (74.3 kg)   Temp Readings from Last 3 Encounters:  08/19/19 97.7 F (36.5 C) (Temporal)  05/02/18 (!) 97.3 F (36.3 C)  04/11/18 98.5 F (36.9 C) (Oral)   BP Readings from Last 3 Encounters:  08/19/19 110/70  05/02/18 108/81  04/11/18 134/70   Pulse Readings from Last 3 Encounters:  08/19/19 88  05/02/18 75  04/11/18 67    Physical Exam Vitals and nursing note reviewed.  Constitutional:      Appearance: Normal appearance. She is well-developed and well-groomed.  HENT:     Head: Normocephalic and atraumatic.     Comments: Cerumen impaction b/l ears   Cardiovascular:     Rate and Rhythm: Normal rate and regular rhythm.     Heart sounds: Normal heart sounds.     Comments: CP not reproducible   Pulmonary:     Effort: Pulmonary effort is normal.     Breath sounds: Normal breath sounds. No wheezing.  Abdominal:     General: Bowel sounds are normal.     Palpations: Abdomen is soft.  Skin:    General: Skin is warm and dry.  Neurological:     General: No focal deficit present.     Mental Status: She is alert and oriented to person, place, and time.     Gait: Gait normal.  Psychiatric:        Attention and Perception:  Attention and perception normal.        Mood and Affect: Mood and affect normal.        Speech: Speech normal.        Behavior: Behavior normal. Behavior is cooperative.  Thought Content: Thought content normal.        Cognition and Memory: Cognition and memory normal.        Judgment: Judgment normal.     Assessment  Plan  Chest pain, unspecified type - Plan: EKG 12-Lead, Comprehensive metabolic panel, CBC with Differential/Platelet, TSH, T4, free, Urinalysis, Routine w reflex microscopic, Vitamin D (25 hydroxy), Troponin I, D-Dimer, Quantitative, DG Chest 2 View, D-Dimer, Quantitative, Troponin I, Vitamin D (25 hydroxy), Urinalysis, Routine w reflex microscopic, T4, free, TSH, CBC with Differential/Platelet, Comprehensive metabolic panel EKG with ST lateral ischemia ? And NSR otherwise   Hyperlipidemia, unspecified hyperlipidemia type - Plan: Lipid panel future  Declined statin in past but agreeable now  rec aspirin 81 mg qd   Hypothyroidism  Abnormal thyroid function test - Plan: TSH, T4, free, Thyroid peroxidase antibody Levo 125 mcg qd   Vitamin D deficiency - Plan: Vitamin D (25 hydroxy), Vitamin D (25 hydroxy)  Bilateral impacted cerumen  rec debrox ear wax drops otc   HM Flu shot utd pna 23 think about  03/07/09 Tdap   Disc shingrix today will wait until after flu shot Declines MMR and hep B check   05/21/19 mammogram negative Pap at f/u h/o abnormal x 1  -pap 04/11/18 neg neg HPV due in 04/11/21 Colonoscopy had 08/27/11 and EGD due repeat h/o barretts; EGD with Dr. Allen Norris 05/02/18 no barretts  rec smoking cessation smoking 10-12 cig/qd no FH lung cancer never smoked more than this  Derm sch 11/18  Given cholesterol handout  Provider: Dr. Olivia Mackie McLean-Scocuzza-Internal Medicine

## 2019-08-19 NOTE — ED Provider Notes (Signed)
[facility] Emergency Department Provider Note   ____________________________________________    I have reviewed the triage vital signs and the nursing notes.   HISTORY  Chief Complaint Chest Pain     HPI Maureen Pena is a 64 y.o. female who presents with reports of chest pain that lasted less than 1 hour 3 days ago while at rest.  Patient made an appointment on Monday with her PCP, saw PCP today had lab work done and EKG.  Was called this afternoon by PCP and notified of elevated heart enzymes and the need to come to the emergency department.  Patient reports she has not had any chest pain today, no shortness of breath, no palpitations.  She has not had any chest pain since initial event on Sunday 3 days ago.  She does have a history of smoking for many years, but no history of coronary artery disease.  She reports the pain was pressure-like  Past Medical History:  Diagnosis Date  . Anxiety   . Barrett's esophagus   . Depression   . Diverticulitis   . History of chicken pox   . Thyroid disease    hypothyroidism  . UTI (urinary tract infection)     Patient Active Problem List   Diagnosis Date Noted  . Hiatal hernia 08/19/2019  . Hyperlipidemia 08/19/2019  . Bilateral impacted cerumen 08/19/2019  . Annual physical exam 04/11/2018  . Diverticulitis 04/10/2018  . Barrett's esophagus without dysplasia 02/17/2018  . Anxiety and depression 02/17/2018  . Gastroesophageal reflux disease without esophagitis 03/07/2017  . Hypothyroidism 03/07/2017    Past Surgical History:  Procedure Laterality Date  . ESOPHAGOGASTRODUODENOSCOPY (EGD) WITH PROPOFOL N/A 05/02/2018   Procedure: ESOPHAGOGASTRODUODENOSCOPY (EGD) WITH PROPOFOL;  Surgeon: Lucilla Lame, MD;  Location: [facility];  Service: Endoscopy;  Laterality: N/A;  . TUBAL LIGATION      Prior to Admission medications   Medication Sig Start Date End Date Taking? Authorizing Provider   Cholecalciferol (VITAMIN D-3) 5000 units TABS Take by mouth.   Yes [provider]  escitalopram (LEXAPRO) 10 MG tablet TAKE 1 TABLET BY MOUTH EVERY DAY 05/15/19  Yes Ursula Alert, MD  levothyroxine (SYNTHROID) 125 MCG tablet Take 1 tablet (125 mcg total) by mouth daily before breakfast. 30 minutes before breakfast. appt further refills 03/06/19  Yes McLean-Scocuzza, Nino Glow, MD  Omega-3 1000 MG CAPS Take 1,000 mg by mouth daily.    Yes [provider]     Allergies Patient has no known allergies.  Family History  Problem Relation Age of Onset  . Depression Mother   . Anxiety disorder Mother   . Schizophrenia Mother   . Paranoid behavior Mother   . Stroke Mother   . COPD Father   . Early death Father   . Hearing loss Father   . Heart disease Father   . CAD Father        s/p cabg x 3   . Depression Brother   . COPD Brother   . Hypertension Brother   . Kidney disease Brother   . Breast cancer Neg Hx     Social History Social History   Tobacco Use  . Smoking status: Current Every Day Smoker    Packs/day: 0.50    Types: Cigarettes  . Smokeless tobacco: Never Used  Substance Use Topics  . Alcohol use: Not Currently    Alcohol/week: 1.0 standard drinks    Types: 1 Glasses of wine per week  .  Drug use: Yes    Types: Marijuana    Review of Systems  Constitutional: No fever/chills Eyes: No visual changes.  ENT: No sore throat. Cardiovascular: As above Respiratory: Denies shortness of breath. Gastrointestinal: No abdominal pain.  No nausea, no vomiting.   Genitourinary: Negative for dysuria. Musculoskeletal: Negative for back pain. Skin: Negative for rash. Neurological: Negative for headaches    ____________________________________________   PHYSICAL EXAM:  VITAL SIGNS: ED Triage Vitals  Enc Vitals Group     BP 08/19/19 1840 117/82     Pulse Rate 08/19/19 1840 96     Resp 08/19/19 1840 20     Temp 08/19/19 1840 99.6 F (37.6 C)      Temp Source 08/19/19 1840 Oral     SpO2 08/19/19 1840 98 %     Weight 08/19/19 1841 80.9 kg (178 lb 6.4 oz)     Height 08/19/19 1841 1.676 m (5\' 6" )     Head Circumference --      Peak Flow --      Pain Score 08/19/19 1841 0     Pain Loc --      Pain Edu? --      Excl. in [place]? --     Constitutional: Alert and oriented.   Nose: No congestion/rhinnorhea. Mouth/Throat: Mucous membranes are moist.    Cardiovascular: Normal rate, regular rhythm. Grossly normal heart sounds.  Good peripheral circulation. Respiratory: Normal respiratory effort.  No retractions. Lungs CTAB. Gastrointestinal: Soft and nontender. No distention.  No CVA tenderness.  Musculoskeletal: No lower extremity tenderness nor edema.  Warm and well perfused Neurologic:  Normal speech and language. No gross focal neurologic deficits are appreciated.  Skin:  Skin is warm, dry and intact. No rash noted. Psychiatric: Mood and affect are normal. Speech and behavior are normal.  ____________________________________________   LABS (all labs ordered are listed, but only abnormal results are displayed)  Labs Reviewed  CBC - Abnormal; Notable for the following components:      Result Value   WBC 13.8 (*)    Hemoglobin 15.1 (*)    All other components within normal limits  SARS CORONAVIRUS 2 (TAT 6-24 HRS)  APTT  PROTIME-INR  COMPREHENSIVE METABOLIC PANEL  CBC  HEPARIN LEVEL (UNFRACTIONATED)  TROPONIN I (HIGH SENSITIVITY)   ____________________________________________  EKG  ED ECG REPORT I, Lavonia Drafts, the attending physician, personally viewed and interpreted this ECG.  Date: 08/19/2019  Rhythm: normal sinus rhythm QRS Axis: normal Intervals: normal ST/T Wave abnormalities: ST elevations noted in 2 3 aVF Narrative Interpretation: Elevations noted as above however the patient is asymptomatic  ____________________________________________  RADIOLOGY  Chest x-ray viewed by me,  unremarkable ____________________________________________   PROCEDURES  Procedure(s) performed: No  Procedures   Critical Care performed: yes  CRITICAL CARE Performed by: Lavonia Drafts   Total critical care time: 30 minutes  Critical care time was exclusive of separately billable procedures and treating other patients.  Critical care was necessary to treat or prevent imminent or life-threatening deterioration.  Critical care was time spent personally by me on the following activities: development of treatment plan with patient and/or surrogate as well as nursing, discussions with consultants, evaluation of patient's response to treatment, examination of patient, obtaining history from patient or surrogate, ordering and performing treatments and interventions, ordering and review of laboratory studies, ordering and review of radiographic studies, pulse oximetry and re-evaluation of patient's condition.  ____________________________________________   INITIAL IMPRESSION / ASSESSMENT AND PLAN / ED COURSE  Pertinent  labs & imaging results that were available during my care of the patient were reviewed by me and considered in my medical decision making (see chart for details).  Patient presents with reports of elevated troponin drawn today at PCP, has not had chest pain in over 3 days.  No chest pain currently.  EKG is concerning however given elevations in 2 3 aVF, I suspect these are related to event 3 days ago have paged cardiology to discuss EKG stat  Discussed with Dr. Saralyn Pilar of cardiology, he agrees no need for emergent catheterization, suspects patient had ST elevation MI on Sunday 3 days ago and troponin has likely peaked.  Recommends heparin drip, aspirin, admission for enzyme trending and cardiology consultation  Discussed with hospitalist for admission    ____________________________________________   FINAL CLINICAL IMPRESSION(S) / ED DIAGNOSES  Final diagnoses:   Myocardial infarction, unspecified MI type, unspecified artery ([place])        Note:  This document was prepared using Dragon voice recognition software and may include unintentional dictation errors.   Lavonia Drafts, MD 08/19/19 1929

## 2019-08-19 NOTE — H&P (Signed)
[facility] NAME: Maureen Pena    MR#:  [id number]  DATE OF BIRTH:  September 09, 1955  DATE OF ADMISSION:  08/19/2019  PRIMARY CARE PHYSICIAN: McLean-Scocuzza, Nino Glow, MD   REQUESTING/REFERRING PHYSICIAN: Lavonia Drafts, MD CHIEF COMPLAINT:   Chief Complaint  Patient presents with  . Chest Pain    HISTORY OF PRESENT ILLNESS:  Maureen Pena  is a 64 y.o. Caucasian female with a known history of anxiety, depression ongoing tobacco abuse and hypothyroidism, who presented to the emergency room with acute onset of midsternal chest pain felt as pressure and graded 6-7/10 in severity while she was watching.  That lasted about 20 minutes and spontaneously resolved.  She called her PCP on Monday and was seen today.  She had labs sent and she was later called for a troponin of 23 to come to the ER.  She denied any worsening dyspnea or palpitations with her chest pain however it has been radiating to both shoulders and her back. She admitted to feeling tired and being sleepy most of the day on Monday when she had low energy.  She has not had much appetite over the last few days.  She denied any loss of taste or smell or exposure to COVID-19.  No leg pain or edema recent travels or surgeries.  She usually walks with her dog for about a mile per day and recently has not been walking very much.  She has occasional smoker's cough but denies any recent hemoptysis.  No other bleeding diathesis.  Upon presentation to the emergency room, temperature was 99.6 and vital signs otherwise were within normal.  Labs revealed slightly elevated AST 56 with normal ALT of 31 and an otherwise CMP was within normal this morning.  CBC that showed except as 15.7 that is down to 13.8 this evening.  CBC otherwise was unremarkable.  TSH was normal as well as free T4.  Two-view chest x-ray showed no acute cardiopulmonary disease.  EKG showed normal sinus rhythm with rate of 92, with  inferior ST segment elevation and 1 mm ST segment depression in V1 through V3 as well as lateral T wave inversion, findings concerning for STEMI.  Dr. Saralyn Pilar was contacted and recommendation was for aspirin, beta-blocker therapy statin therapy and IV heparin drip as well as 2D echo.  Given the fact that the onset was more than 72 hours with no current chest pain no emergency cardiac catheterization was indicated at this time.  The patient was started on IV heparin drip was given 4 baby aspirin.  She will be admitted to an observation telemetry bed for further evaluation and management. PAST MEDICAL HISTORY:   Past Medical History:  Diagnosis Date  . Anxiety   . Barrett's esophagus   . Depression   . Diverticulitis   . History of chicken pox   . Thyroid disease    hypothyroidism  . UTI (urinary tract infection)     PAST SURGICAL HISTORY:   Past Surgical History:  Procedure Laterality Date  . ESOPHAGOGASTRODUODENOSCOPY (EGD) WITH PROPOFOL N/A 05/02/2018   Procedure: ESOPHAGOGASTRODUODENOSCOPY (EGD) WITH PROPOFOL;  Surgeon: Lucilla Lame, MD;  Location: [place];  Service: Endoscopy;  Laterality: N/A;  . TUBAL LIGATION      SOCIAL HISTORY:   Social History   Tobacco Use  . Smoking status: Current Every Day Smoker    Packs/day: 0.50    Types: Cigarettes  . Smokeless tobacco: Never Used  Substance Use  Topics  . Alcohol use: Not Currently    Alcohol/week: 1.0 standard drinks    Types: 1 Glasses of wine per week    FAMILY HISTORY:   Family History  Problem Relation Age of Onset  . Depression Mother   . Anxiety disorder Mother   . Schizophrenia Mother   . Paranoid behavior Mother   . Stroke Mother   . COPD Father   . Early death Father   . Hearing loss Father   . Heart disease Father   . CAD Father        s/p cabg x 3   . Depression Brother   . COPD Brother   . Hypertension Brother   . Kidney disease Brother   . Breast cancer Neg Hx     DRUG  ALLERGIES:  No Known Allergies  REVIEW OF SYSTEMS:   ROS As per history of present illness. All pertinent systems were reviewed above. Constitutional,  HEENT, cardiovascular, respiratory, GI, GU, musculoskeletal, neuro, psychiatric, endocrine,  integumentary and hematologic systems were reviewed and are otherwise  negative/unremarkable except for positive findings mentioned above in the HPI.   MEDICATIONS AT HOME:   Prior to Admission medications   Medication Sig Start Date End Date Taking? Authorizing Provider  Cholecalciferol (VITAMIN D-3) 5000 units TABS Take by mouth.   Yes [provider]  escitalopram (LEXAPRO) 10 MG tablet TAKE 1 TABLET BY MOUTH EVERY DAY 05/15/19  Yes Ursula Alert, MD  levothyroxine (SYNTHROID) 125 MCG tablet Take 1 tablet (125 mcg total) by mouth daily before breakfast. 30 minutes before breakfast. appt further refills 03/06/19  Yes McLean-Scocuzza, Nino Glow, MD  Omega-3 1000 MG CAPS Take 1,000 mg by mouth daily.    Yes [provider]      VITAL SIGNS:  Blood pressure 103/71, pulse 73, temperature 99.6 F (37.6 C), temperature source Oral, resp. rate 16, height 5\' 6"  (1.676 m), weight 80.9 kg, SpO2 96 %.  PHYSICAL EXAMINATION:  Physical Exam  GENERAL:  64 y.o.-year-old Caucasian female patient lying in the bed with no acute distress.  EYES: Pupils equal, round, reactive to light and accommodation. No scleral icterus. Extraocular muscles intact.  HEENT: Head atraumatic, normocephalic. Oropharynx and nasopharynx clear.  NECK:  Supple, no jugular venous distention. No thyroid enlargement, no tenderness.  LUNGS: Normal breath sounds bilaterally, no wheezing, rales,rhonchi or crepitation. No use of accessory muscles of respiration.  CARDIOVASCULAR: Regular rate and rhythm, S1, S2 normal. No murmurs, rubs, or gallops.  ABDOMEN: Soft, nondistended, nontender. Bowel sounds present. No organomegaly or mass.  EXTREMITIES: No pedal edema,  cyanosis, or clubbing.  NEUROLOGIC: Cranial nerves II through XII are intact. Muscle strength 5/5 in all extremities. Sensation intact. Gait not checked.  PSYCHIATRIC: The patient is alert and oriented x 3.  Normal affect and good eye contact. SKIN: No obvious rash, lesion, or ulcer.   LABORATORY PANEL:   CBC Recent Labs  Lab 08/19/19 1850  WBC 13.8*  HGB 15.1*  HCT 45.7  PLT 227   ------------------------------------------------------------------------------------------------------------------  Chemistries  Recent Labs  Lab 08/19/19 1850  NA 137  K 3.8  CL 102  CO2 24  GLUCOSE 108*  BUN 22  CREATININE 1.00  CALCIUM 9.3  AST 48*  ALT 28  ALKPHOS 76  BILITOT 0.6   ------------------------------------------------------------------------------------------------------------------  Cardiac Enzymes Recent Labs  Lab 08/19/19 1127  TROPONINI 23.70*   ------------------------------------------------------------------------------------------------------------------  RADIOLOGY:  DG Chest 2 View  Result Date: 08/19/2019 CLINICAL DATA:  Recurrent chest pain.  EXAM: CHEST - 2 VIEW COMPARISON:  None. FINDINGS: The heart size and mediastinal contours are within normal limits. Both lungs are clear. The visualized skeletal structures are unremarkable. IMPRESSION: No active cardiopulmonary disease. Electronically Signed   By: Marlaine Hind M.D.   On: 08/19/2019 16:40   DG Chest Port 1 View  Result Date: 08/19/2019 CLINICAL DATA:  64 year old female with chest pain for 3 days. EXAM: PORTABLE CHEST 1 VIEW COMPARISON:  Chest radiographs earlier today. FINDINGS: Portable AP upright view at 1859 hours. Lung volumes and mediastinal contours are stable and within normal limits. Visualized tracheal air column is within normal limits. Allowing for portable technique the lungs are clear. There is no longer suggestion of mild increased pulmonary interstitium as seen earlier today. No acute  osseous abnormality identified. Negative visible bowel gas pattern. IMPRESSION: No acute cardiopulmonary abnormality. Electronically Signed   By: Genevie Ann M.D.   On: 08/19/2019 19:21      IMPRESSION AND PLAN:   1.  Evolving/subacute ST segment elevation inferior myocardial infarction. -The patient will be admitted to an observation telemetry bed. -We will follow serial troponin I's and EKGs. -We will obtain a 2D echo and a cardiology consultation by Dr. Saralyn Pilar who is aware about the patient. -We will continue the patient on IV heparin and aspirin. -We will add beta-blocker therapy with Lopressor and statin therapy with Lipitor and she will be on as needed sublingual nitroglycerin.  2.  Ongoing tobacco abuse. -The patient was counseled for smoking cessation and she will receive further counseling here.  3.  Hypothyroidism. -Continue Synthroid.  The patient's TSH was within normal this morning.  4.  Depression and anxiety. -We will continue Lexapro.  5.  DVT prophylaxis. -The patient will be on IV heparin drip.  6.  GI prophylaxis. -We will place the patient on p.o. Pepcid given current anticoagulation.  All the records are reviewed and case discussed with ED provider. The plan of care was discussed in details with the patient (and family). I answered all questions. The patient agreed to proceed with the above mentioned plan. Further management will depend upon hospital course.   CODE STATUS: Full code  TOTAL TIME TAKING CARE OF THIS PATIENT: 55 minutes.    Christel Mormon M.D on 08/19/2019 at 7:51 PM  [facility]   From 7 PM-7 AM, contact night-coverage [email or web address]  CC: Primary care physician; McLean-Scocuzza, Nino Glow, MD   Note: This dictation was prepared with Dragon dictation along with smaller phrase technology. Any transcriptional errors that result from this process are unintentional.

## 2019-08-19 NOTE — ED Triage Notes (Addendum)
Pt states having chest pain that started on Sunday that comes and go and resolved in 15 minutes, today she went to the doctor for blood work and EKG. Pt doctor said heart enzyme is elevated as well as ddimer. Pt has ST elevation on EKG with [place], patient a&ox4, can ambulate to bed, and is talking with Dr. Corky Downs at this moment.  Pt denies shob and chest pain.  Staff at bedside: Amy, charge RN Ali Lowe, RN Jinny Blossom, RN Dr. Awilda Metro, RN (this nurse) Linus Orn, tech

## 2019-08-20 ENCOUNTER — Observation Stay: Admit: 2019-08-20 | Payer: BC Managed Care – PPO

## 2019-08-20 ENCOUNTER — Telehealth: Payer: Self-pay | Admitting: Internal Medicine

## 2019-08-20 ENCOUNTER — Observation Stay (HOSPITAL_COMMUNITY)
Admit: 2019-08-20 | Discharge: 2019-08-20 | Disposition: A | Discharge status: Home / Self Care | Payer: BC Managed Care – PPO | Attending: Cardiovascular Disease | Admitting: Cardiovascular Disease

## 2019-08-20 ENCOUNTER — Encounter: Payer: Self-pay | Admitting: Cardiology

## 2019-08-20 ENCOUNTER — Encounter
Admission: EM | Disposition: A | Discharge status: Home / Self Care | Payer: Self-pay | Source: Home / Self Care | Attending: Internal Medicine

## 2019-08-20 DIAGNOSIS — E782 Mixed hyperlipidemia: Secondary | ICD-10-CM | POA: Diagnosis not present

## 2019-08-20 DIAGNOSIS — Z8249 Family history of ischemic heart disease and other diseases of the circulatory system: Secondary | ICD-10-CM | POA: Diagnosis not present

## 2019-08-20 DIAGNOSIS — F17200 Nicotine dependence, unspecified, uncomplicated: Secondary | ICD-10-CM | POA: Diagnosis not present

## 2019-08-20 DIAGNOSIS — E119 Type 2 diabetes mellitus without complications: Secondary | ICD-10-CM | POA: Diagnosis present

## 2019-08-20 DIAGNOSIS — E039 Hypothyroidism, unspecified: Secondary | ICD-10-CM | POA: Diagnosis present

## 2019-08-20 DIAGNOSIS — I219 Acute myocardial infarction, unspecified: Secondary | ICD-10-CM | POA: Diagnosis present

## 2019-08-20 DIAGNOSIS — I25119 Atherosclerotic heart disease of native coronary artery with unspecified angina pectoris: Secondary | ICD-10-CM | POA: Diagnosis present

## 2019-08-20 DIAGNOSIS — R079 Chest pain, unspecified: Secondary | ICD-10-CM

## 2019-08-20 DIAGNOSIS — Z823 Family history of stroke: Secondary | ICD-10-CM | POA: Diagnosis not present

## 2019-08-20 DIAGNOSIS — Z818 Family history of other mental and behavioral disorders: Secondary | ICD-10-CM | POA: Diagnosis not present

## 2019-08-20 DIAGNOSIS — E785 Hyperlipidemia, unspecified: Secondary | ICD-10-CM | POA: Diagnosis present

## 2019-08-20 DIAGNOSIS — F329 Major depressive disorder, single episode, unspecified: Secondary | ICD-10-CM | POA: Diagnosis present

## 2019-08-20 DIAGNOSIS — Z841 Family history of disorders of kidney and ureter: Secondary | ICD-10-CM | POA: Diagnosis not present

## 2019-08-20 DIAGNOSIS — E118 Type 2 diabetes mellitus with unspecified complications: Secondary | ICD-10-CM

## 2019-08-20 DIAGNOSIS — I2510 Atherosclerotic heart disease of native coronary artery without angina pectoris: Secondary | ICD-10-CM | POA: Diagnosis present

## 2019-08-20 DIAGNOSIS — Z20822 Contact with and (suspected) exposure to covid-19: Secondary | ICD-10-CM | POA: Diagnosis present

## 2019-08-20 DIAGNOSIS — I2121 ST elevation (STEMI) myocardial infarction involving left circumflex coronary artery: Secondary | ICD-10-CM

## 2019-08-20 DIAGNOSIS — F17210 Nicotine dependence, cigarettes, uncomplicated: Secondary | ICD-10-CM | POA: Diagnosis present

## 2019-08-20 DIAGNOSIS — Z79890 Hormone replacement therapy (postmenopausal): Secondary | ICD-10-CM | POA: Diagnosis not present

## 2019-08-20 DIAGNOSIS — Z79899 Other long term (current) drug therapy: Secondary | ICD-10-CM | POA: Diagnosis not present

## 2019-08-20 DIAGNOSIS — Z825 Family history of asthma and other chronic lower respiratory diseases: Secondary | ICD-10-CM | POA: Diagnosis not present

## 2019-08-20 DIAGNOSIS — Z7189 Other specified counseling: Secondary | ICD-10-CM | POA: Diagnosis not present

## 2019-08-20 DIAGNOSIS — I2584 Coronary atherosclerosis due to calcified coronary lesion: Secondary | ICD-10-CM | POA: Diagnosis present

## 2019-08-20 DIAGNOSIS — F419 Anxiety disorder, unspecified: Secondary | ICD-10-CM | POA: Diagnosis present

## 2019-08-20 DIAGNOSIS — I2119 ST elevation (STEMI) myocardial infarction involving other coronary artery of inferior wall: Secondary | ICD-10-CM | POA: Diagnosis present

## 2019-08-20 HISTORY — PX: LEFT HEART CATH AND CORONARY ANGIOGRAPHY: CATH118249

## 2019-08-20 LAB — LIPID PANEL
Cholesterol: 196 mg/dL (ref 0–200)
HDL: 51 mg/dL (ref >40)
LDL Cholesterol: 122 mg/dL — ABNORMAL HIGH (ref 0–99)
Total CHOL/HDL Ratio: 3.8 RATIO
Triglycerides: 113 mg/dL (ref <150)
VLDL: 23 mg/dL (ref 0–40)

## 2019-08-20 LAB — HEPARIN LEVEL (UNFRACTIONATED)
Heparin Unfractionated: 0.41 IU/mL (ref 0.30–0.70)
Heparin Unfractionated: 0.44 IU/mL (ref 0.30–0.70)

## 2019-08-20 LAB — BASIC METABOLIC PANEL
Anion gap: 7 (ref 5–15)
BUN: 20 mg/dL (ref 8–23)
CO2: 24 mmol/L (ref 22–32)
Calcium: 8.9 mg/dL (ref 8.9–10.3)
Chloride: 108 mmol/L (ref 98–111)
Creatinine, Ser: 0.79 mg/dL (ref 0.44–1.00)
GFR calc Af Amer: >60 mL/min (ref >60)
GFR calc non Af Amer: >60 mL/min (ref >60)
Glucose, Bld: 125 mg/dL — ABNORMAL HIGH (ref 70–99)
Potassium: 3.9 mmol/L (ref 3.5–5.1)
Sodium: 139 mmol/L (ref 135–145)

## 2019-08-20 LAB — CBC
HCT: 41.9 % (ref 36.0–46.0)
Hemoglobin: 13.8 g/dL (ref 12.0–15.0)
MCH: 30.7 pg (ref 26.0–34.0)
MCHC: 32.9 g/dL (ref 30.0–36.0)
MCV: 93.1 fL (ref 80.0–100.0)
Platelets: 183 10*3/uL (ref 150–400)
RBC: 4.5 MIL/uL (ref 3.87–5.11)
RDW: 13.5 % (ref 11.5–15.5)
WBC: 10.9 10*3/uL — ABNORMAL HIGH (ref 4.0–10.5)
nRBC: 0 % (ref 0.0–0.2)

## 2019-08-20 LAB — D-DIMER, QUANTITATIVE: D-DIMER: 0.84 mg/L FEU — ABNORMAL HIGH (ref 0.00–0.49)

## 2019-08-20 LAB — SARS CORONAVIRUS 2 (TAT 6-24 HRS): SARS Coronavirus 2: NEGATIVE

## 2019-08-20 LAB — SPECIMEN STATUS

## 2019-08-20 LAB — ECHOCARDIOGRAM COMPLETE
Height: 66 in
Weight: 2867.74 oz

## 2019-08-20 LAB — HIV ANTIBODY (ROUTINE TESTING W REFLEX): HIV Screen 4th Generation wRfx: REACTIVE — AB

## 2019-08-20 LAB — TROPONIN I: Troponin I: 23.7 ng/mL (ref 0.00–0.04)

## 2019-08-20 SURGERY — LEFT HEART CATH AND CORONARY ANGIOGRAPHY
Anesthesia: Moderate Sedation

## 2019-08-20 MED ORDER — SODIUM CHLORIDE 0.9% FLUSH
3.0000 mL | Freq: Two times a day (BID) | INTRAVENOUS | Status: DC
  Administered 2019-08-20: 3 mL via INTRAVENOUS

## 2019-08-20 MED ORDER — HEPARIN (PORCINE) 25000 UT/250ML-% IV SOLN
1000.0000 [IU]/h | INTRAVENOUS | Status: DC
  Administered 2019-08-20 – 2019-08-21 (×2): 900 [IU]/h via INTRAVENOUS
  Filled 2019-08-20 (×2): qty 250

## 2019-08-20 MED ORDER — SODIUM CHLORIDE 0.9 % IV SOLN: INTRAVENOUS | Status: DC | PRN

## 2019-08-20 MED ORDER — SODIUM CHLORIDE 0.9 % WEIGHT BASED INFUSION
1.0000 mL/kg/h | INTRAVENOUS | Status: AC
  Administered 2019-08-20 (×2): 1 mL/kg/h via INTRAVENOUS

## 2019-08-20 MED ORDER — FENTANYL CITRATE (PF) 100 MCG/2ML IJ SOLN
INTRAMUSCULAR | Status: AC
  Filled 2019-08-20: qty 2

## 2019-08-20 MED ORDER — CLOPIDOGREL BISULFATE 75 MG PO TABS
300.0000 mg | ORAL_TABLET | Freq: Once | ORAL | Status: AC
  Administered 2019-08-20: 300 mg via ORAL
  Filled 2019-08-20: qty 4

## 2019-08-20 MED ORDER — HYDRALAZINE HCL 20 MG/ML IJ SOLN: 10.0000 mg | INTRAMUSCULAR | Status: AC | PRN

## 2019-08-20 MED ORDER — FENTANYL CITRATE (PF) 100 MCG/2ML IJ SOLN
INTRAMUSCULAR | Status: DC | PRN
  Administered 2019-08-20: 25 ug via INTRAVENOUS

## 2019-08-20 MED ORDER — SODIUM CHLORIDE 0.9 % IV BOLUS
INTRAVENOUS | Status: AC | PRN
  Administered 2019-08-20: 250 mL via INTRAVENOUS

## 2019-08-20 MED ORDER — MIDAZOLAM HCL 2 MG/2ML IJ SOLN
INTRAMUSCULAR | Status: DC | PRN
  Administered 2019-08-20: 0.5 mg via INTRAVENOUS

## 2019-08-20 MED ORDER — ASPIRIN 81 MG PO CHEW: 81.0000 mg | CHEWABLE_TABLET | ORAL | Status: DC

## 2019-08-20 MED ORDER — SODIUM CHLORIDE 0.9% FLUSH: 3.0000 mL | Freq: Two times a day (BID) | INTRAVENOUS | Status: DC

## 2019-08-20 MED ORDER — IOHEXOL 300 MG/ML  SOLN
INTRAMUSCULAR | Status: DC | PRN
  Administered 2019-08-20: 60 mL

## 2019-08-20 MED ORDER — SODIUM CHLORIDE 0.9% FLUSH: 3.0000 mL | INTRAVENOUS | Status: DC | PRN

## 2019-08-20 MED ORDER — ASPIRIN EC 81 MG PO TBEC
81.0000 mg | DELAYED_RELEASE_TABLET | Freq: Every day | ORAL | Status: DC
  Administered 2019-08-21: 81 mg via ORAL
  Filled 2019-08-20: qty 1

## 2019-08-20 MED ORDER — MIDAZOLAM HCL 2 MG/2ML IJ SOLN
INTRAMUSCULAR | Status: AC
  Filled 2019-08-20: qty 2

## 2019-08-20 MED ORDER — ONDANSETRON HCL 4 MG/2ML IJ SOLN: 4.0000 mg | Freq: Four times a day (QID) | INTRAMUSCULAR | Status: DC | PRN

## 2019-08-20 MED ORDER — ACETAMINOPHEN 325 MG PO TABS: 650.0000 mg | ORAL_TABLET | ORAL | Status: DC | PRN

## 2019-08-20 MED ORDER — CLOPIDOGREL BISULFATE 75 MG PO TABS
75.0000 mg | ORAL_TABLET | Freq: Every day | ORAL | Status: DC
  Administered 2019-08-21: 75 mg via ORAL
  Filled 2019-08-20: qty 1

## 2019-08-20 MED ORDER — LABETALOL HCL 5 MG/ML IV SOLN: 10.0000 mg | INTRAVENOUS | Status: AC | PRN

## 2019-08-20 MED ORDER — HEPARIN (PORCINE) IN NACL 1000-0.9 UT/500ML-% IV SOLN
INTRAVENOUS | Status: AC
  Filled 2019-08-20: qty 1000

## 2019-08-20 MED ORDER — SODIUM CHLORIDE 0.9 % IV SOLN: 250.0000 mL | INTRAVENOUS | Status: DC | PRN

## 2019-08-20 MED ORDER — SODIUM CHLORIDE 0.9 % IV SOLN: INTRAVENOUS | Status: DC

## 2019-08-20 MED ORDER — HEPARIN (PORCINE) IN NACL 1000-0.9 UT/500ML-% IV SOLN
INTRAVENOUS | Status: DC | PRN
  Administered 2019-08-20: 500 mL

## 2019-08-20 SURGICAL SUPPLY — 9 items
CATH INFINITI 5FR ANG PIGTAIL (CATHETERS) ×2 IMPLANT
CATH INFINITI 5FR JL4 (CATHETERS) ×2 IMPLANT
CATH INFINITI JR4 5F (CATHETERS) ×2 IMPLANT
DEVICE CLOSURE MYNXGRIP 5F (Vascular Products) ×2 IMPLANT
KIT MANI 3VAL PERCEP (MISCELLANEOUS) ×2 IMPLANT
NEEDLE PERC 18GX7CM (NEEDLE) ×2 IMPLANT
PACK CARDIAC CATH (CUSTOM PROCEDURE TRAY) ×2 IMPLANT
SHEATH AVANTI 5FR X 11CM (SHEATH) ×2 IMPLANT
WIRE GUIDERIGHT .035X150 (WIRE) ×2 IMPLANT

## 2019-08-20 NOTE — Telephone Encounter (Signed)
Sent pt to Ed 08/19/19   [place]

## 2019-08-20 NOTE — Telephone Encounter (Signed)
Maureen Pena from [facility] calling with a critical lab  Troponin I = 23.70

## 2019-08-20 NOTE — Telephone Encounter (Signed)
Maureen Pena [phone] [facility] please call for important lab results. Ref# [id number]

## 2019-08-20 NOTE — Progress Notes (Addendum)
PROGRESS NOTE    Maureen Pena  [id number] DOB: July 26, 1955 DOA: 08/19/2019  PCP: McLean-Scocuzza, Nino Glow, MD    LOS - 0   Brief Narrative:  64 year old female with history of anxiety, depression, ongoing tobacco abuse and hypothyroidism who presented to the ED on 3/10 with midsternal chest pain described as pressure that started while she was watching TV and lasted about 20 minutes before spontaneously resolving.  PCP had labs drawn 2 days before, and patient was called and informed of elevated troponin of 23 and told to come to the ER.  In the ED patient was afebrile and stable vitals.   Labs were notable for mildly elevated liver enzymes, leukocytosis of 15.7 which later was 13.8.  TSH and T4 were normal.  Chest x-ray was negative for acute cardiopulmonary disease.  EKG was normal sinus rhythm at a rate of 92 with inferior ST segment elevation and ST depressions in V1 through V3, T wave inversions in the lateral leads as well, findings concerning for STEMI.  Cardiology was consulted and recommended admission for further evaluation and starting aspirin, beta-blocker, statin and IV heparin drip, and obtaining echocardiogram.  On 3/11, patient was taken to Cath Lab where she was found to have 100% occlusion over LCx, consistent with late presentation of STEMI.  Continuing medical management.  Subjective 3/11: Patient seen today after going to Cath Lab.  She denies any chest pain since admission.  States she was hoping to go home today.  She denies fevers or chills, nausea vomiting diarrhea, shortness of breath palpitations or other acute complaints.  No acute events reported overnight.  Assessment & Plan:   Principal Problem:   STEMI (ST elevation myocardial infarction) ([place]) Active Problems:   Coronary artery disease   Hypothyroidism   Anxiety and depression   Hyperlipidemia   STEMI Coronary artery disease --Cardiology following --Continue heparin drip overnight --Continue  Lopressor 12.5 mg p.o. twice daily --Continue high intensity statin with Lipitor 80 mg daily-1800 --Cardiac rehab referral on discharge --Complete smoking cessation  Hypothyroidism TSH and free T4 normal upon admission.  Positive anti-TPO antibody, consistent with Hashimoto's thryoiditis. --continue current dose levothyroxine  Depression with anxiety - stable.  Continue Lexapro  Tobacco abuse - in setting of STEMI, smoking cessation is required --patient advised and expresses understanding   DVT prophylaxis: on heparin drip   Code Status: Full Code  Family Communication: none at bedside, patient indicated she would update   Disposition Plan:  Expect d/c home in 24-48 hours pending improvement and clearance by cardiology.   Coming From home Exp DC Date 3/12 Barriers heparin gtt overnight Medically Stable for Discharge? No   Severity of Illness: The appropriate patient status for this patient is INPATIENT. It is not anticipated that the patient will be medically stable for discharge from the hospital within 2 midnights of admission.  The following factors support the patient status of inpatient: --Presenting symptoms include Chest Pain. --Initial radiographic and laboratory data are worrisome because of elevated troponin, ECG findings consistent with STEMI. --Chronic co-morbidities include hypothyroidism, previously undiagnosed coronary artery disease. Further evaluation and management in hospital, as outlined above, is warranted because guideline-directed therapy for late-presenting STEMI requires 48 hours heparin infusion, to be continued overnight.   Consultants:   Cardiology, Dr. Rockey Situ  Procedures:   Left Heart Catherization 3/11 - 100% occlusion of LCx artery, no PCI  Antimicrobials:   None    Objective: Vitals:   08/20/19 1200 08/20/19 1215 08/20/19 1230 08/20/19 1313  BP: 95/66 102/67  114/70  Pulse: (!) 59 (!) 59 63 (!) 59  Resp: 13 19 19 18   Temp:    98.3  F (36.8 C)  TempSrc:    Oral  SpO2: 95% 96% 99% 97%  Weight:      Height:        Intake/Output Summary (Last 24 hours) at 08/20/2019 1337 Last data filed at 08/20/2019 0700 Gross per 24 hour  Intake 1167.68 ml  Output 0 ml  Net 1167.68 ml   Filed Weights   08/19/19 2319 08/20/19 0558 08/20/19 0936  Weight: 80.9 kg 81.3 kg 81.3 kg    Examination:  General exam: awake, alert, no acute distress HEENT: atraumatic, clear conjunctiva, anicteric sclera, moist mucus membranes, hearing grossly normal  Respiratory system: CTAB, no wheezes, rales or rhonchi, normal respiratory effort. Cardiovascular system: normal S1/S2, RRR, no JVD, murmurs, rubs, gallops, no pedal edema.   Gastrointestinal system: soft, NT, ND, no HSM felt, +bowel sounds. Central nervous system: A&O x4. no gross focal neurologic deficits, normal speech Extremities: moves all, no edema, normal tone Skin: dry, intact, normal temperature, normal color Psychiatry: normal mood, congruent affect, judgement and insight appear normal    Data Reviewed: I have personally reviewed following labs and imaging studies  CBC: Recent Labs  Lab 08/19/19 1127 08/19/19 1850 08/20/19 0121  WBC 15.7* 13.8* 10.9*  NEUTROABS 10.7*  --   --   HGB 14.9 15.1* 13.8  HCT 45.0 45.7 41.9  MCV 92.4 94.0 93.1  PLT 224.0 227 [id number]   Basic Metabolic Panel: Recent Labs  Lab 08/19/19 1127 08/19/19 1850 08/20/19 0121  NA 137 137 139  K 4.4 3.8 3.9  CL 101 102 108  CO2 27 24 24   GLUCOSE 94 108* 125*  BUN 20 22 20   CREATININE 0.98 1.00 0.79  CALCIUM 10.0 9.3 8.9   GFR: Estimated Creatinine Clearance: 78.4 mL/min (by C-G formula based on SCr of 0.79 mg/dL). Liver Function Tests: Recent Labs  Lab 08/19/19 1127 08/19/19 1850  AST 56* 48*  ALT 31 28  ALKPHOS 92 76  BILITOT 0.8 0.6  PROT 6.9 6.9  ALBUMIN 4.1 3.9   No results for input(s): LIPASE, AMYLASE in the last 168 hours. No results for input(s): AMMONIA in the last 168  hours. Coagulation Profile: Recent Labs  Lab 08/19/19 1127 08/19/19 1850  INR WILL FOLLOW 0.9   Cardiac Enzymes: Recent Labs  Lab 08/19/19 1127  TROPONINI 23.70*   BNP (last 3 results) No results for input(s): PROBNP in the last 8760 hours. HbA1C: No results for input(s): HGBA1C in the last 72 hours. CBG: No results for input(s): GLUCAP in the last 168 hours. Lipid Profile: Recent Labs    08/20/19 0121  CHOL 196  HDL 51  LDLCALC 122*  TRIG 113  CHOLHDL 3.8   Thyroid Function Tests: Recent Labs    08/19/19 1127  TSH 0.85  FREET4 1.19   Anemia Panel: No results for input(s): VITAMINB12, FOLATE, FERRITIN, TIBC, IRON, RETICCTPCT in the last 72 hours. Sepsis Labs: No results for input(s): PROCALCITON, LATICACIDVEN in the last 168 hours.  Recent Results (from the past 240 hour(s))  SARS CORONAVIRUS 2 (TAT 6-24 HRS) Nasopharyngeal Nasopharyngeal Swab     Status: None   Collection Time: 08/19/19  6:51 PM   Specimen: Nasopharyngeal Swab  Result Value Ref Range Status   SARS Coronavirus 2 NEGATIVE NEGATIVE Final    Comment: (NOTE) SARS-CoV-2 target nucleic acids are NOT DETECTED. The SARS-CoV-2  RNA is generally detectable in upper and lower respiratory specimens during the acute phase of infection. Negative results do not preclude SARS-CoV-2 infection, do not rule out co-infections with other pathogens, and should not be used as the sole basis for treatment or other patient management decisions. Negative results must be combined with clinical observations, patient history, and epidemiological information. The expected result is Negative. Fact Sheet for Patients: SugarRoll.be Fact Sheet for Healthcare Providers: https://www.woods-mathews.com/ This test is not yet approved or cleared by the [place] FDA and  has been authorized for detection and/or diagnosis of SARS-CoV-2 by FDA under an Emergency Use Authorization  (EUA). This EUA will remain  in effect (meaning this test can be used) for the duration of the COVID-19 declaration under Section 56 4(b)(1) of the Act, 21 U.S.C. section 360bbb-3(b)(1), unless the authorization is terminated or revoked sooner. Performed at [facility], [place] [address], [place], [place] [address]          Radiology Studies: DG Chest 2 View  Result Date: 08/19/2019 CLINICAL DATA:  Recurrent chest pain. EXAM: CHEST - 2 VIEW COMPARISON:  None. FINDINGS: The heart size and mediastinal contours are within normal limits. Both lungs are clear. The visualized skeletal structures are unremarkable. IMPRESSION: No active cardiopulmonary disease. Electronically Signed   By: Marlaine Hind M.D.   On: 08/19/2019 16:40   CARDIAC CATHETERIZATION  Result Date: 08/20/2019  Prox Cx to Mid Cx lesion is 100% stenosed.  The left ventricular ejection fraction is 45-50% by visual estimate.  There is mild left ventricular systolic dysfunction.  LV end diastolic pressure is normal.  There is no mitral valve regurgitation.    DG Chest Port 1 View  Result Date: 08/19/2019 CLINICAL DATA:  64 year old female with chest pain for 3 days. EXAM: PORTABLE CHEST 1 VIEW COMPARISON:  Chest radiographs earlier today. FINDINGS: Portable AP upright view at 1859 hours. Lung volumes and mediastinal contours are stable and within normal limits. Visualized tracheal air column is within normal limits. Allowing for portable technique the lungs are clear. There is no longer suggestion of mild increased pulmonary interstitium as seen earlier today. No acute osseous abnormality identified. Negative visible bowel gas pattern. IMPRESSION: No acute cardiopulmonary abnormality. Electronically Signed   By: Genevie Ann M.D.   On: 08/19/2019 19:21        Scheduled Meds: . aspirin EC  325 mg Oral Daily  . atorvastatin  80 mg Oral q1800  . [START ON 08/21/2019] clopidogrel  75 mg Oral Daily  . escitalopram  10 mg Oral  Daily  . famotidine  20 mg Oral BID  . levothyroxine  125 mcg Oral QAC breakfast  . metoprolol tartrate  12.5 mg Oral BID  . omega-3 acid ethyl esters  1,000 mg Oral Daily  . sodium chloride flush  3 mL Intravenous Q12H  . sodium chloride flush  3 mL Intravenous Q12H   Continuous Infusions: . sodium chloride 100 mL/hr at 08/20/19 0606  . sodium chloride    . sodium chloride 1 mL/kg/hr (08/20/19 1323)  . heparin       LOS: 0 days    Time spent: 40 minutes    Ezekiel Slocumb, DO [facility]   If 7PM-7AM, please contact night-coverage [email or web address] 08/20/2019, 1:37 PM

## 2019-08-20 NOTE — Telephone Encounter (Signed)
-----   Message from Delorise Jackson, MD sent at 08/20/2019  7:44 AM EST ----- D dimer and Troponin elevated disc with pt in the hospital will establish with [facility] cardiology in future for now go to the ED   Vitamin D normal  Thyroid lab tsh, ft4 normal, 1 of thyroid antibodies elevated  -this could mean could develop thyroid issues in the future   Consider thyroid US in the future   1 of liver enzymes slightly elevated  white blood cell ct elevated and hemoglobin  Urine nothing concerning  White blood cell ct slightly elevated

## 2019-08-20 NOTE — Telephone Encounter (Signed)
We have received these already. FYI

## 2019-08-20 NOTE — Consult Note (Signed)
Cardiology Consultation:   Patient ID: Maureen Pena MRN: [id number]; DOB: [birth year]  Admit date: 08/19/2019 Date of Consult: 08/20/2019  Primary Care Provider: McLean-Scocuzza, Nino Glow, MD Primary Cardiologist: Ida Rogue, MD  Primary Electrophysiologist:  None   Patient Profile:   Maureen Pena is a 64 y.o. female with a hx of HLD, tobacco use (10-15 cigarettes daily), hypothyroidism, and hiatal hernia who is being seen today for the evaluation of ST Elevation Myocardial Infarction at the request of Dr. Ethelene Hal.  History of Present Illness:   Maureen Pena is a 64 yo female with PMH as above.  She has a family history of heart disease with her father having CABG x3.  She also has a personal history of hyperlipidemia and is a current smoker with estimated 10 to 15 cigarettes daily.  She walks her dog daily for activity, estimated at approximately 1 mile; however, she admits that she has recently not been walking as much. She reports that she has not been eating healthy lately, eating a lot of ice-cream last year and peanut butter cookies as of late. She presents today from her PCP's clinic and after report of chest pain at rest and while watching television. He first chest pain episode reportedly began a month ago and was described as chest tightness with pain radiating into her shoulders.  This pain then spontaneously resolved; however, it returned again on 08/16/2019 with chest tightness and sharp upper back pain, rated 7/10 in severity.  This chest pain lasted 10 to 15 minutes and again spontaneously resolved.  Associated symptoms included anxiety 2/2 chest pain, as well as nausea the following day. No reported racing HR, palpitations, SOB/DOE, LEE, orthopnea, or PND. No s/sx consistent with bleeding. She reported a "smoker's cough" but no hemoptysis. She did report a lack of appetite over the last several days. She also felt very fatigued on Monday 3/8. She made an appointment  to see her PCP on Monday 3/8 and was subsequently seen 3/10 and then sent to the emergency department after the clinic collected a troponin I that was 23.70 and Ddimer 0.84.  Presented to [facility] emergency department 08/19/2019.  While in the ED, she denied any further CP since her episode on Sunday.  In the ED, vitals significant for temperature 99.6 F, respiration rate 20, SPO2 98%, HR 96 bpm, BP 117/82.  Labs significant for high-sensitivity troponin 10,525, AST 56  48, ALT 31  28, alk phos 92  76, sodium 137, potassium 4.4  3.8,creatinine 0.98  1.00, BUN 20, albumin 4.1  3.9, calcium 10.0  9.3, WBC 15.7, hemoglobin 14.9, hematocrit 45.0, platelets 224.  COVID-19 negative. CXR without acute finding. EKG showed probable evolving inferior STEMI. She was started on heparin, metoprolol tartrate, ASA, and statin with echo ordered.   Heart Pathway Score:     Past Medical History:  Diagnosis Date  . Anxiety   . Barrett's esophagus   . Depression   . Diverticulitis   . History of chicken pox   . Thyroid disease    hypothyroidism  . UTI (urinary tract infection)     Past Surgical History:  Procedure Laterality Date  . ESOPHAGOGASTRODUODENOSCOPY (EGD) WITH PROPOFOL N/A 05/02/2018   Procedure: ESOPHAGOGASTRODUODENOSCOPY (EGD) WITH PROPOFOL;  Surgeon: Lucilla Lame, MD;  Location: [place];  Service: Endoscopy;  Laterality: N/A;  . TUBAL LIGATION       Home Medications:  Prior to Admission medications   Medication Sig Start Date End Date Taking? Authorizing Provider  Cholecalciferol (  VITAMIN D-3) 5000 units TABS Take by mouth.   Yes [provider]  escitalopram (LEXAPRO) 10 MG tablet TAKE 1 TABLET BY MOUTH EVERY DAY 05/15/19  Yes Ursula Alert, MD  levothyroxine (SYNTHROID) 125 MCG tablet Take 1 tablet (125 mcg total) by mouth daily before breakfast. 30 minutes before breakfast. appt further refills 03/06/19  Yes McLean-Scocuzza, Nino Glow, MD  Omega-3 1000 MG CAPS Take 1,000 mg  by mouth daily.    Yes [provider]    Inpatient Medications: Scheduled Meds: . aspirin EC  325 mg Oral Daily  . atorvastatin  80 mg Oral q1800  . escitalopram  10 mg Oral Daily  . famotidine  20 mg Oral BID  . levothyroxine  125 mcg Oral QAC breakfast  . metoprolol tartrate  12.5 mg Oral BID  . omega-3 acid ethyl esters  1,000 mg Oral Daily   Continuous Infusions: . sodium chloride 100 mL/hr at 08/20/19 0606  . heparin 900 Units/hr (08/20/19 0606)   PRN Meds: acetaminophen, nitroGLYCERIN, ondansetron (ZOFRAN) IV, zolpidem  Allergies:   No Known Allergies  Social History:   Social History   Socioeconomic History  . Marital status: Divorced    Spouse name: Not on file  . Number of children: 2  . Years of education: Not on file  . Highest education level: Bachelor's degree (e.g., BA, AB, BS)  Occupational History    Comment: part time  Tobacco Use  . Smoking status: Current Every Day Smoker    Packs/day: 0.50    Types: Cigarettes  . Smokeless tobacco: Never Used  Substance and Sexual Activity  . Alcohol use: Not Currently    Alcohol/week: 1.0 standard drinks    Types: 1 Glasses of wine per week  . Drug use: Yes    Types: Marijuana  . Sexual activity: Not Currently  Other Topics Concern  . Not on file  Social History Narrative   Kids    No guns    Wears seat belt    Safe in relationship    Smoker    Lives with Lexicographer    Social Determinants of Health   Financial Resource Strain:   . Difficulty of Paying Living Expenses:   Food Insecurity:   . Worried About Charity fundraiser in the Last Year:   . Arboriculturist in the Last Year:   Transportation Needs:   . Film/video editor (Medical):   Marland Kitchen Lack of Transportation (Non-Medical):   Physical Activity:   . Days of Exercise per Week:   . Minutes of Exercise per Session:   Stress:   . Feeling of Stress :   Social Connections:   . Frequency of Communication with Friends and Family:    . Frequency of Social Gatherings with Friends and Family:   . Attends Religious Services:   . Active Member of Clubs or Organizations:   . Attends Archivist Meetings:   Marland Kitchen Marital Status:   Intimate Partner Violence:   . Fear of Current or Ex-Partner:   . Emotionally Abused:   Marland Kitchen Physically Abused:   . Sexually Abused:     Family History:    Family History  Problem Relation Age of Onset  . Depression Mother   . Anxiety disorder Mother   . Schizophrenia Mother   . Paranoid behavior Mother   . Stroke Mother   . COPD Father   . Early death Father   . Hearing loss  Father   . Heart disease Father   . CAD Father        s/p cabg x 3   . Depression Brother   . COPD Brother   . Hypertension Brother   . Kidney disease Brother   . Breast cancer Neg Hx      ROS:  Please see the history of present illness.  Review of Systems  Constitutional: Positive for malaise/fatigue.  Respiratory: Negative for hemoptysis and shortness of breath.   Cardiovascular: Positive for chest pain. Negative for palpitations, orthopnea, leg swelling and PND.  Gastrointestinal: Positive for nausea. Negative for blood in stool, melena and vomiting.  Genitourinary: Negative for hematuria.  All other systems reviewed and are negative.   All other ROS reviewed and negative.     Physical Exam/Data:   Vitals:   08/19/19 2230 08/19/19 2319 08/20/19 0558 08/20/19 0731  BP: 124/76 121/72 103/65 91/61  Pulse: 64 62 66 (!) 59  Resp: 19   16  Temp:  98.7 F (37.1 C) 98.9 F (37.2 C) 98.2 F (36.8 C)  TempSrc:  Oral Oral   SpO2: 94% 99% 96% 97%  Weight:  80.9 kg 81.3 kg   Height:  5' 6"  (1.676 m)      Intake/Output Summary (Last 24 hours) at 08/20/2019 0830 Last data filed at 08/20/2019 0606 Gross per 24 hour  Intake 844.43 ml  Output 0 ml  Net 844.43 ml   Last 3 Weights 08/20/2019 08/19/2019 08/19/2019  Weight (lbs) 179 lb 3.2 oz 178 lb 4.8 oz 178 lb 6.4 oz  Weight (kg) 81.285 kg 80.876 kg  80.922 kg  Some encounter information is confidential and restricted. Go to Review Flowsheets activity to see all data.     Body mass index is 28.92 kg/m.  General:  Well nourished, well developed, in no acute distress HEENT: normal Neck: no JVD Vascular: No carotid bruits; radial pulses 2+ bilaterally Cardiac:  normal S1, S2; RRR; no murmur  Lungs:  clear to auscultation bilaterally, no wheezing, rhonchi or rales  Abd: soft, nontender, no hepatomegaly  Ext: no edema Musculoskeletal:  No deformities, BUE and BLE strength normal and equal Skin: warm and dry  Neuro:  No focal abnormalities noted Psych:  Normal affect   EKG:  The EKG was personally reviewed and demonstrates:  NSR, inferior ST elevation per review of notes (not available per EMR) Telemetry:  Telemetry was personally reviewed and demonstrates:  NSR, 70s  Relevant CV Studies: Pending echo  Laboratory Data:  High Sensitivity Troponin:   Recent Labs  Lab 08/19/19 1850  TROPONINIHS 10,525*     Cardiac Enzymes Recent Labs  Lab 08/19/19 1127  TROPONINI 23.70*   No results for input(s): TROPIPOC in the last 168 hours.  Chemistry Recent Labs  Lab 08/19/19 1127 08/19/19 1850 08/20/19 0121  NA 137 137 139  K 4.4 3.8 3.9  CL 101 102 108  CO2 27 24 24   GLUCOSE 94 108* 125*  BUN 20 22 20   CREATININE 0.98 1.00 0.79  CALCIUM 10.0 9.3 8.9  GFRNONAA  --  >60 >60  GFRAA  --  >60 >60  ANIONGAP  --  11 7    Recent Labs  Lab 08/19/19 1127 08/19/19 1850  PROT 6.9 6.9  ALBUMIN 4.1 3.9  AST 56* 48*  ALT 31 28  ALKPHOS 92 76  BILITOT 0.8 0.6   Hematology Recent Labs  Lab 08/19/19 1127 08/19/19 1850 08/20/19 0121  WBC 15.7* 13.8* 10.9*  RBC 4.87 4.86 4.50  HGB 14.9 15.1* 13.8  HCT 45.0 45.7 41.9  MCV 92.4 94.0 93.1  MCH  --  31.1 30.7  MCHC 33.0 33.0 32.9  RDW 14.2 13.4 13.5  PLT 224.0 227 183   BNPNo results for input(s): BNP, PROBNP in the last 168 hours.  DDimer  Recent Labs  Lab  08/19/19 1127  DDIMER 0.84*     Radiology/Studies:  DG Chest 2 View  Result Date: 08/19/2019 CLINICAL DATA:  Recurrent chest pain. EXAM: CHEST - 2 VIEW COMPARISON:  None. FINDINGS: The heart size and mediastinal contours are within normal limits. Both lungs are clear. The visualized skeletal structures are unremarkable. IMPRESSION: No active cardiopulmonary disease. Electronically Signed   By: Marlaine Hind M.D.   On: 08/19/2019 16:40   DG Chest Port 1 View  Result Date: 08/19/2019 CLINICAL DATA:  64 year old female with chest pain for 3 days. EXAM: PORTABLE CHEST 1 VIEW COMPARISON:  Chest radiographs earlier today. FINDINGS: Portable AP upright view at 1859 hours. Lung volumes and mediastinal contours are stable and within normal limits. Visualized tracheal air column is within normal limits. Allowing for portable technique the lungs are clear. There is no longer suggestion of mild increased pulmonary interstitium as seen earlier today. No acute osseous abnormality identified. Negative visible bowel gas pattern. IMPRESSION: No acute cardiopulmonary abnormality. Electronically Signed   By: Genevie Ann M.D.   On: 08/19/2019 19:21    Assessment and Plan:   STEMI --No current CP. Reports most recent episode of CP as occurring on Sunday 3/7 and associated with fatigue and nausea. She presented to her PCP on 3/10 with troponin I elevated and sent to the ED, where her subsequent HS Tn was also elevated and EKG (not available per review of EMR at this time, showed inferior ST elevation myocardial infarction. Given her risk factors for cardiac etiology, including current smoker and HLD. Echo ordered and pending to assess for EF and acute structural abnormalities. Given her elevated HS Tn and EKG changes, LHC with possible PCI was recommended and patient consented. She has been NPO and remains on heparin, ASA, BB, and statin. Further recommendations pending catheterization. --Risks and benefits of cardiac  catheterization have been discussed with the patient.  These include bleeding, infection, kidney damage, stroke, heart attack, death.  The patient understands these risks and is willing to proceed.  HLD --Continue high intensity statin therapy. Repeat lipid and liver in 6-8 weeks.  Current smoker --Cessation advised.   For questions or updates, please contact [place] Please consult [email or web address] for contact info under     Signed, Arvil Chaco, PA-C  08/20/2019 8:30 AM

## 2019-08-20 NOTE — Consult Note (Signed)
ANTICOAGULATION CONSULT NOTE  Pharmacy Consult for  Indication: ACS / STEMI  No Known Allergies  Patient Measurements: Height: 5\' 6"  (167.6 cm) Weight: 179 lb 3.7 oz (81.3 kg) IBW/kg (Calculated) : 59.3 Heparin Dosing Weight: 76.2 kg   Vital Signs: Temp: 98.3 F (36.8 C) (03/11 1313) Temp Source: Oral (03/11 1313) BP: 114/70 (03/11 1313) Pulse Rate: 59 (03/11 1313)  Labs: Recent Labs    08/19/19 1127 08/19/19 1127 08/19/19 1850 08/20/19 0121 08/20/19 0700  HGB 14.9   < > 15.1* 13.8  --   HCT 45.0  --  45.7 41.9  --   PLT 224.0  --  227 183  --   APTT WILL FOLLOW  --  29  --   --   LABPROT WILL FOLLOW  --  12.0  --   --   INR WILL FOLLOW  --  0.9  --   --   HEPARINUNFRC  --   --   --  0.41 0.44  CREATININE 0.98  --  1.00 0.79  --   TROPONINIHS  --   --  10,525*  --   --    < > = values in this interval not displayed.    Estimated Creatinine Clearance: 78.4 mL/min (by C-G formula based on SCr of 0.79 mg/dL).   Medications:  Confirmed with chart review and ED nurse there were no PTA anticoagulants   Assessment: Pharmacy consulted for heparin dosing for STEMI. Baseline CBC and coags within normal limits.   H&H, platelets trending down today which may be attributable to hemodilution as patient is on fluids.  Update: Patient went for cardiac cath this morning. Per cardiology, resume heparin at 1800 today at previous rate of 900 units/hr. Tentative plan to continue through tomorrow afternoon.   Goal of Therapy:  Heparin level 0.3-0.7 units/ml Monitor platelets by anticoagulation protocol: Yes   Plan:  -Start Heparin 900 units/hr at 1800 -HL 6 hours after heparin re-started (~3/12 @ 0000) -CBC stable will continue to monitor.  [place] Resident 08/20/2019,2:00 PM

## 2019-08-20 NOTE — Progress Notes (Signed)
*  PRELIMINARY RESULTS* Echocardiogram 2D Echocardiogram has been performed.  Sherrie Sport 08/20/2019, 12:12 PM

## 2019-08-20 NOTE — Telephone Encounter (Signed)
Patient went to the hospital yesterday and was admitted. Patient states she is feeling okay for now but is still in the hospital.  She had a catheterization done. One artery is blocked while the other two are clean. She states the cardiologist wants to manage this medically instead of placing a stent. The  Patient states that her enzyme levels have gone up since being in the hospital but they are working quickly to find out what is going on.   Patient states she would like to thank Dr Olivia Mackie and wished she could give her a HUGE hug as the patient says she is sure Dr Olivia Mackie saved her life last night.

## 2019-08-20 NOTE — Plan of Care (Signed)

## 2019-08-20 NOTE — Consult Note (Signed)
ANTICOAGULATION CONSULT NOTE  Pharmacy Consult for  Indication: ACS / STEMI  No Known Allergies  Patient Measurements: Height: 5\' 6"  (167.6 cm) Weight: 179 lb 3.2 oz (81.3 kg) IBW/kg (Calculated) : 59.3 Heparin Dosing Weight: 76.2 kg   Vital Signs: Temp: 98.2 F (36.8 C) (03/11 0731) Temp Source: Oral (03/11 0558) BP: 91/61 (03/11 0731) Pulse Rate: 59 (03/11 0731)  Labs: Recent Labs    08/19/19 1127 08/19/19 1127 08/19/19 1850 08/20/19 0121 08/20/19 0700  HGB 14.9   < > 15.1* 13.8  --   HCT 45.0  --  45.7 41.9  --   PLT 224.0  --  227 183  --   APTT WILL FOLLOW  --  29  --   --   LABPROT WILL FOLLOW  --  12.0  --   --   INR WILL FOLLOW  --  0.9  --   --   HEPARINUNFRC  --   --   --  0.41 0.44  CREATININE 0.98  --  1.00 0.79  --   TROPONINIHS  --   --  10,525*  --   --    < > = values in this interval not displayed.    Estimated Creatinine Clearance: 78.4 mL/min (by C-G formula based on SCr of 0.79 mg/dL).   Medications:  Confirmed with chart review and ED nurse there were no PTA anticoagulants   Assessment: Pharmacy consulted for heparin dosing for STEMI. Baseline CBC and coags within normal limits.   H&H, platelets trending down today which may be attributable to hemodilution as patient is on fluids.  Goal of Therapy:  Heparin level 0.3-0.7 units/ml Monitor platelets by anticoagulation protocol: Yes   Plan:  -03/11 @ 0700 HL 0.44 therapeutic x2. Continue current rate. -HL tomorrow AM -CBC stable will continue to monitor.  [place] Resident 08/20/2019,8:22 AM

## 2019-08-20 NOTE — Telephone Encounter (Signed)
I did not order this   [place]

## 2019-08-20 NOTE — Progress Notes (Signed)
Daughter

## 2019-08-20 NOTE — Consult Note (Signed)
ANTICOAGULATION CONSULT NOTE  Pharmacy Consult for  Indication: ACS / STEMI  No Known Allergies  Patient Measurements: Height: 5\' 6"  (167.6 cm) Weight: 178 lb 4.8 oz (80.9 kg) IBW/kg (Calculated) : 59.3 Heparin Dosing Weight: 76.2 kg   Vital Signs: Temp: 98.7 F (37.1 C) (03/10 2319) Temp Source: Oral (03/10 2319) BP: 121/72 (03/10 2319) Pulse Rate: 62 (03/10 2319)  Labs: Recent Labs    08/19/19 1127 08/19/19 1127 08/19/19 1850 08/20/19 0121  HGB 14.9   < > 15.1* 13.8  HCT 45.0  --  45.7 41.9  PLT 224.0  --  227 183  APTT WILL FOLLOW  --  29  --   LABPROT WILL FOLLOW  --  12.0  --   INR WILL FOLLOW  --  0.9  --   HEPARINUNFRC  --   --   --  0.41  CREATININE 0.98  --  1.00 0.79  TROPONINIHS  --   --  10,525*  --    < > = values in this interval not displayed.    Estimated Creatinine Clearance: 78.2 mL/min (by C-G formula based on SCr of 0.79 mg/dL).   Medications:  Confirmed with chart review and ED nurse there were no PTA anticoagulants   Assessment: Pharmacy consulted for heparin dosing. CBC appropriate and other baseline labs were ordered and results pending.   Goal of Therapy:  Heparin level 0.3-0.7 units/ml Monitor platelets by anticoagulation protocol: Yes   Plan:  03/11 @ 0100 HL 0.41 therapeutic. Will continue current rate and will recheck HL at 0700, CBC stable will continue to monitor.  Tobie Lords, PharmD, BCPS Clinical Pharmacist 08/20/2019,3:57 AM

## 2019-08-21 ENCOUNTER — Telehealth: Payer: Self-pay | Admitting: Cardiovascular Disease

## 2019-08-21 DIAGNOSIS — E785 Hyperlipidemia, unspecified: Secondary | ICD-10-CM

## 2019-08-21 LAB — CBC
HCT: 42.1 % (ref 36.0–46.0)
Hemoglobin: 13.5 g/dL (ref 12.0–15.0)
MCH: 30.1 pg (ref 26.0–34.0)
MCHC: 32.1 g/dL (ref 30.0–36.0)
MCV: 93.8 fL (ref 80.0–100.0)
Platelets: 192 10*3/uL (ref 150–400)
RBC: 4.49 MIL/uL (ref 3.87–5.11)
RDW: 13.6 % (ref 11.5–15.5)
WBC: 9.9 10*3/uL (ref 4.0–10.5)
nRBC: 0 % (ref 0.0–0.2)

## 2019-08-21 LAB — TROPONIN I (HIGH SENSITIVITY): Troponin I (High Sensitivity): 4986 ng/L (ref <18)

## 2019-08-21 LAB — URINALYSIS, ROUTINE W REFLEX MICROSCOPIC
Bacteria, UA: NONE SEEN /HPF
Bilirubin Urine: NEGATIVE
Glucose, UA: NEGATIVE
Hgb urine dipstick: NEGATIVE
Nitrite: NEGATIVE
Specific Gravity, Urine: 1.023 (ref 1.001–1.03)
Squamous Epithelial / HPF: NONE SEEN /HPF (ref <=5)
pH: 5.5 (ref 5.0–8.0)

## 2019-08-21 LAB — THYROID PEROXIDASE ANTIBODY

## 2019-08-21 LAB — HEPARIN LEVEL (UNFRACTIONATED): Heparin Unfractionated: 0.29 IU/mL — ABNORMAL LOW (ref 0.30–0.70)

## 2019-08-21 MED ORDER — NITROGLYCERIN 0.4 MG SL SUBL: 0.4000 mg | SUBLINGUAL_TABLET | SUBLINGUAL | 1 refills | Status: DC | PRN

## 2019-08-21 MED ORDER — ASPIRIN 81 MG PO TBEC: 81.0000 mg | DELAYED_RELEASE_TABLET | Freq: Every day | ORAL | 1 refills | Status: DC

## 2019-08-21 MED ORDER — THE SENSUOUS HEART BOOK: 1.0000 | Freq: Once | 0 refills | Status: AC

## 2019-08-21 MED ORDER — THE SENSUOUS HEART BOOK
Freq: Once | Status: DC
  Filled 2019-08-21: qty 1

## 2019-08-21 MED ORDER — ATORVASTATIN CALCIUM 80 MG PO TABS: 80.0000 mg | ORAL_TABLET | Freq: Every day | ORAL | 1 refills | Status: DC

## 2019-08-21 MED ORDER — METOPROLOL TARTRATE 25 MG PO TABS: 12.5000 mg | ORAL_TABLET | Freq: Two times a day (BID) | ORAL | 1 refills | Status: DC

## 2019-08-21 MED ORDER — CLOPIDOGREL BISULFATE 75 MG PO TABS: 75.0000 mg | ORAL_TABLET | Freq: Every day | ORAL | 1 refills | Status: DC

## 2019-08-21 NOTE — Consult Note (Signed)
ANTICOAGULATION CONSULT NOTE  Pharmacy Consult for  Indication: ACS / STEMI  No Known Allergies  Patient Measurements: Height: 5\' 6"  (167.6 cm) Weight: 179 lb 3.7 oz (81.3 kg) IBW/kg (Calculated) : 59.3 Heparin Dosing Weight: 76.2 kg   Vital Signs: Temp: 99.6 F (37.6 C) (03/11 1959) Temp Source: Oral (03/11 1959) BP: 103/67 (03/11 1959) Pulse Rate: 63 (03/11 1959)  Labs: Recent Labs    08/19/19 1127 08/19/19 1127 08/19/19 1850 08/20/19 0121 08/20/19 0700 08/21/19 0006  HGB 14.9   < > 15.1* 13.8  --   --   HCT 45.0  --  45.7 41.9  --   --   PLT 224.0  --  227 183  --   --   APTT WILL FOLLOW  --  29  --   --   --   LABPROT WILL FOLLOW  --  12.0  --   --   --   INR WILL FOLLOW  --  0.9  --   --   --   HEPARINUNFRC  --   --   --  0.41 0.44 0.29*  CREATININE 0.98  --  1.00 0.79  --   --   TROPONINIHS  --   --  10,525*  --   --   --    < > = values in this interval not displayed.    Estimated Creatinine Clearance: 78.4 mL/min (by C-G formula based on SCr of 0.79 mg/dL).   Medications:  Confirmed with chart review and ED nurse there were no PTA anticoagulants   Assessment: Pharmacy consulted for heparin dosing for STEMI. Baseline CBC and coags within normal limits.   H&H, platelets trending down today which may be attributable to hemodilution as patient is on fluids.  Update: Patient went for cardiac cath this morning. Per cardiology, resume heparin at 1800 today at previous rate of 900 units/hr. Tentative plan to continue through tomorrow afternoon.   Goal of Therapy:  Heparin level 0.3-0.7 units/ml Monitor platelets by anticoagulation protocol: Yes   Plan:  03/12 @ 0000 HL 0.29 slightly subtherapeutic. Will increase rate to 1000 units/hr and will recheck HL at 0800 will continue to monitor.  Tobie Lords, PharmD, BCPS Clinical Pharmacist 08/21/2019,2:33 AM

## 2019-08-21 NOTE — Telephone Encounter (Signed)
TCM....  Patient is being discharged      They are scheduled to see Gilford Rile 3/19      They need to be seen within 1-2 weeks      Please call

## 2019-08-21 NOTE — Progress Notes (Signed)
Progress Note  Patient Name: Maureen Pena Date of Encounter: 08/21/2019  Primary Cardiologist: Ida Rogue, MD   Subjective   She denies any chest pain, racing HR, SOB, palpitations, or edema. She reports that she feels well other than the IV, which she is ready to be taken out.  Inpatient Medications    Scheduled Meds: . aspirin EC  81 mg Oral Daily  . atorvastatin  80 mg Oral q1800  . clopidogrel  75 mg Oral Daily  . escitalopram  10 mg Oral Daily  . famotidine  20 mg Oral BID  . levothyroxine  125 mcg Oral QAC breakfast  . metoprolol tartrate  12.5 mg Oral BID  . omega-3 acid ethyl esters  1,000 mg Oral Daily  . sodium chloride flush  3 mL Intravenous Q12H  . sodium chloride flush  3 mL Intravenous Q12H  . the sensuous heart book   Does not apply Once   Continuous Infusions: . sodium chloride 100 mL/hr at 08/20/19 0606  . sodium chloride    . heparin 1,000 Units/hr (08/21/19 0400)   PRN Meds: sodium chloride, acetaminophen, nitroGLYCERIN, ondansetron (ZOFRAN) IV, sodium chloride flush, zolpidem   Vital Signs    Vitals:   08/20/19 1554 08/20/19 1959 08/21/19 0550 08/21/19 0728  BP: (!) 102/59 103/67 103/61 102/67  Pulse: (!) 58 63 60 60  Resp: 16 20 20 18   Temp: 98 F (36.7 C) 99.6 F (37.6 C) 98.4 F (36.9 C) 98.5 F (36.9 C)  TempSrc:  Oral Oral Oral  SpO2: 97% 97% 96% 98%  Weight:   82.1 kg   Height:        Intake/Output Summary (Last 24 hours) at 08/21/2019 0751 Last data filed at 08/21/2019 0634 Gross per 24 hour  Intake 815.88 ml  Output 2400 ml  Net -1584.12 ml   Last 3 Weights 08/21/2019 08/20/2019 08/20/2019  Weight (lbs) 181 lb 179 lb 3.7 oz 179 lb 3.2 oz  Weight (kg) 82.101 kg 81.3 kg 81.285 kg  Some encounter information is confidential and restricted. Go to Review Flowsheets activity to see all data.      Telemetry    NSR 59-77bpm - Personally Reviewed  ECG    EKG from yesterday unavailable per review of EMR - Personally  Reviewed  Physical Exam   GEN: No acute distress.   Neck: No JVD Cardiac: bradycardic but regular, no murmurs, rubs, or gallops.  Respiratory: Clear to auscultation bilaterally. GI: Soft, nontender, non-distended  MS: No edema; No deformity. Neuro:  Nonfocal  Psych: Normal affect   Labs    High Sensitivity Troponin:   Recent Labs  Lab 08/19/19 1850  TROPONINIHS 10,525*      Chemistry Recent Labs  Lab 08/19/19 1127 08/19/19 1850 08/20/19 0121  NA 137 137 139  K 4.4 3.8 3.9  CL 101 102 108  CO2 27 24 24   GLUCOSE 94 108* 125*  BUN 20 22 20   CREATININE 0.98 1.00 0.79  CALCIUM 10.0 9.3 8.9  PROT 6.9 6.9  --   ALBUMIN 4.1 3.9  --   AST 56* 48*  --   ALT 31 28  --   ALKPHOS 92 76  --   BILITOT 0.8 0.6  --   GFRNONAA  --  >60 >60  GFRAA  --  >60 >60  ANIONGAP  --  11 7     Hematology Recent Labs  Lab 08/19/19 1127 08/19/19 1850 08/20/19 0121  WBC 15.7* 13.8* 10.9*  RBC  4.87 4.86 4.50  HGB 14.9 15.1* 13.8  HCT 45.0 45.7 41.9  MCV 92.4 94.0 93.1  MCH  --  31.1 30.7  MCHC 33.0 33.0 32.9  RDW 14.2 13.4 13.5  PLT 224.0 227 183    BNPNo results for input(s): BNP, PROBNP in the last 168 hours.   DDimer  Recent Labs  Lab 08/19/19 1127  DDIMER 0.84*     Radiology    DG Chest 2 View  Result Date: 08/19/2019 CLINICAL DATA:  Recurrent chest pain. EXAM: CHEST - 2 VIEW COMPARISON:  None. FINDINGS: The heart size and mediastinal contours are within normal limits. Both lungs are clear. The visualized skeletal structures are unremarkable. IMPRESSION: No active cardiopulmonary disease. Electronically Signed   By: Marlaine Hind M.D.   On: 08/19/2019 16:40   CARDIAC CATHETERIZATION  Result Date: 08/20/2019  Prox Cx to Mid Cx lesion is 100% stenosed.  The left ventricular ejection fraction is 45-50% by visual estimate.  There is mild left ventricular systolic dysfunction.  LV end diastolic pressure is normal.  There is no mitral valve regurgitation.    DG  Chest Port 1 View  Result Date: 08/19/2019 CLINICAL DATA:  64 year old female with chest pain for 3 days. EXAM: PORTABLE CHEST 1 VIEW COMPARISON:  Chest radiographs earlier today. FINDINGS: Portable AP upright view at 1859 hours. Lung volumes and mediastinal contours are stable and within normal limits. Visualized tracheal air column is within normal limits. Allowing for portable technique the lungs are clear. There is no longer suggestion of mild increased pulmonary interstitium as seen earlier today. No acute osseous abnormality identified. Negative visible bowel gas pattern. IMPRESSION: No acute cardiopulmonary abnormality. Electronically Signed   By: Genevie Ann M.D.   On: 08/19/2019 19:21   ECHOCARDIOGRAM COMPLETE  Result Date: 08/20/2019    ECHOCARDIOGRAM REPORT   Patient Name:   Maureen Pena Date of Exam: 08/20/2019 Medical Rec #:  [id number]            Height:       66.0 in Accession #:    [id number]           Weight:       179.2 lb Date of Birth:  1956-01-04           BSA:          1.909 m Patient Age:    64 years             BP:           96/69 mmHg Patient Gender: F                    HR:           58 bpm. Exam Location:  [facility] Procedure: 2D Echo, Cardiac Doppler and Color Doppler Indications:     Elevated troponin  History:         Patient has no prior history of Echocardiogram examinations.                  Anxiety.  Sonographer:     Sherrie Sport RDCS (AE) Referring Phys:  [place] Diagnosing Phys: Ida Rogue MD  Sonographer Comments: No apical window and no parasternal window. The only obtainable view was modified subcostal. The patient was kept supine - due to post cath recovery. IMPRESSIONS  1. Left ventricular ejection fraction, by estimation, is 45 to 50%. The left ventricle has mildly decreased function. The left ventricle demonstrates regional  wall motion abnormalities (inferior wall hypokinesis).  2. Right ventricular systolic function is normal. The right ventricular size  is normal. Tricuspid regurgitation signal is inadequate for assessing PA pressure.  3. Mild to moderate mitral valve regurgitation. FINDINGS  Left Ventricle: Left ventricular ejection fraction, by estimation, is 45 to 50%. The left ventricle has mildly decreased function. The left ventricle demonstrates regional wall motion abnormalities. The left ventricular internal cavity size was normal in size. There is no left ventricular hypertrophy. Left ventricular diastolic parameters are indeterminate. Right Ventricle: The right ventricular size is normal. No increase in right ventricular wall thickness. Right ventricular systolic function is normal. Tricuspid regurgitation signal is inadequate for assessing PA pressure. Left Atrium: Left atrial size was normal in size. Right Atrium: Right atrial size was normal in size. Pericardium: There is no evidence of pericardial effusion. Mitral Valve: The mitral valve is normal in structure. Normal mobility of the mitral valve leaflets. Mild to moderate mitral valve regurgitation. No evidence of mitral valve stenosis. Tricuspid Valve: The tricuspid valve is normal in structure. Tricuspid valve regurgitation is not demonstrated. No evidence of tricuspid stenosis. Aortic Valve: The aortic valve was not well visualized. Aortic valve regurgitation is not visualized. No aortic stenosis is present. Pulmonic Valve: The pulmonic valve was normal in structure. Pulmonic valve regurgitation is not visualized. No evidence of pulmonic stenosis. Aorta: The aortic root is normal in size and structure. Venous: The inferior vena cava is normal in size with greater than 50% respiratory variability, suggesting right atrial pressure of 3 mmHg. IAS/Shunts: No atrial level shunt detected by color flow Doppler.  LEFT VENTRICLE PLAX 2D LVIDd:         3.76 cm LVIDs:         2.77 cm LV PW:         0.86 cm LV IVS:        0.93 cm  LEFT ATRIUM         Index LA diam:    3.70 cm 1.94 cm/m  PULMONIC VALVE PV  Vmax:        0.73 m/s PV Peak grad:   2.1 mmHg RVOT Peak grad: 3 mmHg  Ida Rogue MD Electronically signed by Ida Rogue MD Signature Date/Time: 08/20/2019/1:41:53 PM    Final     Cardiac Studies   Echo 08/20/2019 1. Left ventricular ejection fraction, by estimation, is 45 to 50%. The  left ventricle has mildly decreased function. The left ventricle  demonstrates regional wall motion abnormalities (inferior wall  hypokinesis).  2. Right ventricular systolic function is normal. The right ventricular  size is normal. Tricuspid regurgitation signal is inadequate for assessing  PA pressure.  3. Mild to moderate mitral valve regurgitation.   LHC 08/20/19  Prox Cx to Mid Cx lesion is 100% stenosed.  The left ventricular ejection fraction is 45-50% by visual estimate.  There is mild left ventricular systolic dysfunction.  LV end diastolic pressure is normal.  There is no mitral valve regurgitation.  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 Fletcher Anon and Dr. Saunders Revel, Recommendation made for medical management Recommendations:  We will look to restart heparin infusion early evening , and 5 to 6 hours after groin healed  continue heparin infusion through till tomorrow afternoon -Addition of beta-blockers and ACE/ARB may be limited by  hypotension.  We will continue to monitor blood pressure for now --Aspirin Plavix 1 year   Patient Profile     64 y.o. female with a hx of late presenting STEMI, HLD, tobacco use (10-15 cigarettes daily), hypothyroidism, and hiatal hernia, and who is being seen today for the evaluation of 08/16/19 STEMI s/p LHC.  Assessment & Plan    Late presenting STEMI --No current CP. Late presenting STEMI with initial event 08/16/19. Sx included CP  radiating through to her back for less than 1 hour at that time, as well as nausea and fatigue. --LHC as above with severe 1v dz of p-mLCx after the OM1. R femoral arteriotomy site without signs of swelling or infection and without bruit.  --Echo as above with EF 45-50%, inferior wall hypokinesis, and mild to moderate MR. Recommend the below medical therapy with follow-up echo in 3-6 months to reassess pump function.  --Discontinue IV heparin and IVF at this time, as patient has been seen by MD with labs/vitals reviewed and ready for discharge. --Medical management recommended. Continue DAPT with Plavix and ASA. Plavix should be continued for at lest 12 months with ASA continued indefinitely. Addition of BB and ACE/ARB currently precluded by hypotension and bradycardia. She has not taken metoprolol this admission per review of EMR. Started on high intensity statin with Lipitor 80mg  daily today and will need repeat lipid and liver in 6-8 weeks. Will need PRN SL nitro added at discharge.  --Will need follow-up in the office in the next 1-2 weeks.  HLD --Continue high intensity statin therapy. Repeat lipid and liver in 6-8 weeks.  Current smoker --Cessation advised.   For questions or updates, please contact [place] Please consult [email or web address] for contact info under     Signed, Arvil Chaco, PA-C  08/21/2019, 7:51 AM

## 2019-08-21 NOTE — Progress Notes (Signed)
Discharge instructions explained to pt/ verbalized an understanding/ iv and tele removed/ will transport off unit via wheelchair.  

## 2019-08-21 NOTE — Discharge Summary (Signed)
Physician Discharge Summary  Maureen Pena DOB: 06/11/1956 DOA: 08/19/2019  PCP: McLean-Scocuzza, Nino Glow, MD  Admit date: 08/19/2019 Discharge date: 08/21/2019  Admitted From: home Disposition:  home  Recommendations for Outpatient Follow-up:  1. Follow up with PCP in 1-2 weeks 2. Please obtain BMP/CBC in one week 3. Please follow up with cardiology in 1-2 weeks  Home Health: no  Equipment/Devices: none  Discharge Condition: Stable  CODE STATUS: Full  Diet recommendation: Heart healthy  Brief/Interim Summary:  64 year old female with history of anxiety, depression, ongoing tobacco abuse and hypothyroidism who presented to the ED on 3/10 with midsternal chest pain described as pressure that started while she was watching TV and lasted about 20 minutes before spontaneously resolving.  PCP had labs drawn 2 days before, and patient was called and informed of elevated troponin of 23 and told to come to the ER.  In the ED patient was afebrile and stable vitals.   Labs were notable for mildly elevated liver enzymes, leukocytosis of 15.7 which later was 13.8.  TSH and T4 were normal.  Chest x-ray was negative for acute cardiopulmonary disease.  EKG was normal sinus rhythm at a rate of 92 with inferior ST segment elevation and ST depressions in V1 through V3, T wave inversions in the lateral leads as well, findings concerning for STEMI.  Cardiology was consulted and recommended admission for further evaluation and starting aspirin, beta-blocker, statin and IV heparin drip, and obtaining echocardiogram.  On 3/11, patient was taken to Cath Lab where she was found to have 100% occlusion over LCx, consistent with late presentation of STEMI.  Continued on guideline directed medical therapies.  Referred to cardiac rehab.  Patient strongly advised to stop smoking and expressed agreement and intends to quit.   Discharge Diagnoses: Principal Problem:   STEMI (ST elevation myocardial  infarction) [facility]) Active Problems:   Coronary artery disease   Hypothyroidism   Anxiety and depression   Hyperlipidemia    Discharge Instructions   Discharge Instructions    AMB Referral to Cardiac Rehabilitation - Phase II   Complete by: As directed    Late presenting STEMI   Diagnosis: Other   After initial evaluation and assessments completed: Virtual Based Care may be provided alone or in conjunction with Phase 2 Cardiac Rehab based on patient barriers.: Yes   Call MD for:   Complete by: As directed    Chest pain or if chest pain not relieved by taking nitroglycerin.   Call MD for:  extreme fatigue   Complete by: As directed    Call MD for:  severe uncontrolled pain   Complete by: As directed    Diet - low sodium heart healthy   Complete by: As directed    Discharge instructions   Complete by: As directed    Take all medications as prescribed. Do your best to try to stop smoking.  Your PCP can offer support and/or medications to assist in your efforts to quit. Please follow up with cardiology and attend cardiac rehab.   Increase activity slowly   Complete by: As directed      Allergies as of 08/21/2019   No Known Allergies     Medication List    TAKE these medications   aspirin 81 MG EC tablet Take 1 tablet (81 mg total) by mouth daily. Start taking on: August 22, 2019   atorvastatin 80 MG tablet Commonly known as: LIPITOR Take 1 tablet (80 mg total) by mouth daily at  6 PM.   clopidogrel 75 MG tablet Commonly known as: PLAVIX Take 1 tablet (75 mg total) by mouth daily. Start taking on: August 22, 2019   escitalopram 10 MG tablet Commonly known as: LEXAPRO TAKE 1 TABLET BY MOUTH EVERY DAY   levothyroxine 125 MCG tablet Commonly known as: SYNTHROID Take 1 tablet (125 mcg total) by mouth daily before breakfast. 30 minutes before breakfast. appt further refills   metoprolol tartrate 25 MG tablet Commonly known as: LOPRESSOR Take 0.5 tablets (12.5 mg  total) by mouth 2 (two) times daily.   nitroGLYCERIN 0.4 MG SL tablet Commonly known as: NITROSTAT Place 1 tablet (0.4 mg total) under the tongue every 5 (five) minutes x 3 doses as needed for chest pain.   Omega-3 1000 MG Caps Take 1,000 mg by mouth daily.   the sensuous heart book Misc 1 each by Does not apply route once for 1 dose.   Vitamin D-3 125 MCG (5000 UT) Tabs Take by mouth.       No Known Allergies  Consultations:  cardiology    Procedures/Studies: DG Chest 2 View  Result Date: 08/19/2019 CLINICAL DATA:  Recurrent chest pain. EXAM: CHEST - 2 VIEW COMPARISON:  None. FINDINGS: The heart size and mediastinal contours are within normal limits. Both lungs are clear. The visualized skeletal structures are unremarkable. IMPRESSION: No active cardiopulmonary disease. Electronically Signed   By: Marlaine Hind M.D.   On: 08/19/2019 16:40   CARDIAC CATHETERIZATION  Result Date: 08/20/2019  Prox Cx to Mid Cx lesion is 100% stenosed.  The left ventricular ejection fraction is 45-50% by visual estimate.  There is mild left ventricular systolic dysfunction.  LV end diastolic pressure is normal.  There is no mitral valve regurgitation.    DG Chest Port 1 View  Result Date: 08/19/2019 CLINICAL DATA:  64 year old female with chest pain for 3 days. EXAM: PORTABLE CHEST 1 VIEW COMPARISON:  Chest radiographs earlier today. FINDINGS: Portable AP upright view at 1859 hours. Lung volumes and mediastinal contours are stable and within normal limits. Visualized tracheal air column is within normal limits. Allowing for portable technique the lungs are clear. There is no longer suggestion of mild increased pulmonary interstitium as seen earlier today. No acute osseous abnormality identified. Negative visible bowel gas pattern. IMPRESSION: No acute cardiopulmonary abnormality. Electronically Signed   By: Genevie Ann M.D.   On: 08/19/2019 19:21   ECHOCARDIOGRAM COMPLETE  Result Date:  08/20/2019    ECHOCARDIOGRAM REPORT   Patient Name:   Maureen Pena Date of Exam: 08/20/2019 Medical Rec #:  [id number]            Height:       66.0 in Accession #:    [id number]           Weight:       179.2 lb Date of Birth:  Nov 29, 1955           BSA:          1.909 m Patient Age:    16 years             BP:           96/69 mmHg Patient Gender: F                    HR:           58 bpm. Exam Location:  [facility] Procedure: 2D Echo, Cardiac Doppler and Color Doppler Indications:     Elevated  troponin  History:         Patient has no prior history of Echocardiogram examinations.                  Anxiety.  Sonographer:     Sherrie Sport RDCS (AE) Referring Phys:  [place] Diagnosing Phys: Ida Rogue MD  Sonographer Comments: No apical window and no parasternal window. The only obtainable view was modified subcostal. The patient was kept supine - due to post cath recovery. IMPRESSIONS  1. Left ventricular ejection fraction, by estimation, is 45 to 50%. The left ventricle has mildly decreased function. The left ventricle demonstrates regional wall motion abnormalities (inferior wall hypokinesis).  2. Right ventricular systolic function is normal. The right ventricular size is normal. Tricuspid regurgitation signal is inadequate for assessing PA pressure.  3. Mild to moderate mitral valve regurgitation. FINDINGS  Left Ventricle: Left ventricular ejection fraction, by estimation, is 45 to 50%. The left ventricle has mildly decreased function. The left ventricle demonstrates regional wall motion abnormalities. The left ventricular internal cavity size was normal in size. There is no left ventricular hypertrophy. Left ventricular diastolic parameters are indeterminate. Right Ventricle: The right ventricular size is normal. No increase in right ventricular wall thickness. Right ventricular systolic function is normal. Tricuspid regurgitation signal is inadequate for assessing PA pressure. Left Atrium: Left  atrial size was normal in size. Right Atrium: Right atrial size was normal in size. Pericardium: There is no evidence of pericardial effusion. Mitral Valve: The mitral valve is normal in structure. Normal mobility of the mitral valve leaflets. Mild to moderate mitral valve regurgitation. No evidence of mitral valve stenosis. Tricuspid Valve: The tricuspid valve is normal in structure. Tricuspid valve regurgitation is not demonstrated. No evidence of tricuspid stenosis. Aortic Valve: The aortic valve was not well visualized. Aortic valve regurgitation is not visualized. No aortic stenosis is present. Pulmonic Valve: The pulmonic valve was normal in structure. Pulmonic valve regurgitation is not visualized. No evidence of pulmonic stenosis. Aorta: The aortic root is normal in size and structure. Venous: The inferior vena cava is normal in size with greater than 50% respiratory variability, suggesting right atrial pressure of 3 mmHg. IAS/Shunts: No atrial level shunt detected by color flow Doppler.  LEFT VENTRICLE PLAX 2D LVIDd:         3.76 cm LVIDs:         2.77 cm LV PW:         0.86 cm LV IVS:        0.93 cm  LEFT ATRIUM         Index LA diam:    3.70 cm 1.94 cm/m  PULMONIC VALVE PV Vmax:        0.73 m/s PV Peak grad:   2.1 mmHg RVOT Peak grad: 3 mmHg  Ida Rogue MD Electronically signed by Ida Rogue MD Signature Date/Time: 08/20/2019/1:41:53 PM    Final         Subjective: Patient seen this AM.  Denies chest pain or other complaints.  No acute events reported.  We discussed importance of smoking cessation.   Discharge Exam: Vitals:   08/21/19 0550 08/21/19 0728  BP: 103/61 102/67  Pulse: 60 60  Resp: 20 18  Temp: 98.4 F (36.9 C) 98.5 F (36.9 C)  SpO2: 96% 98%   Vitals:   08/20/19 1554 08/20/19 1959 08/21/19 0550 08/21/19 0728  BP: (!) 102/59 103/67 103/61 102/67  Pulse: (!) 58 63 60 60  Resp:  16 20 20 18   Temp: 98 F (36.7 C) 99.6 F (37.6 C) 98.4 F (36.9 C) 98.5 F (36.9  C)  TempSrc:  Oral Oral Oral  SpO2: 97% 97% 96% 98%  Weight:   82.1 kg   Height:        General: Pt is alert, awake, not in acute distress Cardiovascular: RRR, S1/S2 +, no rubs, no gallops Respiratory: CTA bilaterally, no wheezing, no rhonchi Abdominal: Soft, NT, ND, bowel sounds + Extremities: no edema, no cyanosis    The results of significant diagnostics from this hospitalization (including imaging, microbiology, ancillary and laboratory) are listed below for reference.     Microbiology: Recent Results (from the past 240 hour(s))  SARS CORONAVIRUS 2 (TAT 6-24 HRS) Nasopharyngeal Nasopharyngeal Swab     Status: None   Collection Time: 08/19/19  6:51 PM   Specimen: Nasopharyngeal Swab  Result Value Ref Range Status   SARS Coronavirus 2 NEGATIVE NEGATIVE Final    Comment: (NOTE) SARS-CoV-2 target nucleic acids are NOT DETECTED. The SARS-CoV-2 RNA is generally detectable in upper and lower respiratory specimens during the acute phase of infection. Negative results do not preclude SARS-CoV-2 infection, do not rule out co-infections with other pathogens, and should not be used as the sole basis for treatment or other patient management decisions. Negative results must be combined with clinical observations, patient history, and epidemiological information. The expected result is Negative. Fact Sheet for Patients: SugarRoll.be Fact Sheet for Healthcare Providers: https://www.woods-mathews.com/ This test is not yet approved or cleared by the [place] FDA and  has been authorized for detection and/or diagnosis of SARS-CoV-2 by FDA under an Emergency Use Authorization (EUA). This EUA will remain  in effect (meaning this test can be used) for the duration of the COVID-19 declaration under Section 56 4(b)(1) of the Act, 21 U.S.C. section 360bbb-3(b)(1), unless the authorization is terminated or revoked sooner. Performed at [facility], [place] [address] [place], [place] [address]      Labs: BNP (last 3 results) No results for input(s): BNP in the last 8760 hours. Basic Metabolic Panel: Recent Labs  Lab 08/19/19 1127 08/19/19 1850 08/20/19 0121  NA 137 137 139  K 4.4 3.8 3.9  CL 101 102 108  CO2 27 24 24   GLUCOSE 94 108* 125*  BUN 20 22 20   CREATININE 0.98 1.00 0.79  CALCIUM 10.0 9.3 8.9   Liver Function Tests: Recent Labs  Lab 08/19/19 1127 08/19/19 1850  AST 56* 48*  ALT 31 28  ALKPHOS 92 76  BILITOT 0.8 0.6  PROT 6.9 6.9  ALBUMIN 4.1 3.9   No results for input(s): LIPASE, AMYLASE in the last 168 hours. No results for input(s): AMMONIA in the last 168 hours. CBC: Recent Labs  Lab 08/19/19 1127 08/19/19 1850 08/20/19 0121 08/21/19 0834  WBC 15.7* 13.8* 10.9* 9.9  NEUTROABS 10.7*  --   --   --   HGB 14.9 15.1* 13.8 13.5  HCT 45.0 45.7 41.9 42.1  MCV 92.4 94.0 93.1 93.8  PLT 224.0 227 183 192   Cardiac Enzymes: Recent Labs  Lab 08/19/19 1127  TROPONINI 23.70*   BNP: Invalid input(s): POCBNP CBG: No results for input(s): GLUCAP in the last 168 hours. D-Dimer Recent Labs    08/19/19 1127  DDIMER 0.84*   Hgb A1c No results for input(s): HGBA1C in the last 72 hours. Lipid Profile Recent Labs    08/20/19 0121  CHOL 196  HDL 51  LDLCALC 122*  TRIG 113  CHOLHDL 3.8   Thyroid function studies Recent Labs    08/19/19 1127  TSH 0.85   Anemia work up No results for input(s): VITAMINB12, FOLATE, FERRITIN, TIBC, IRON, RETICCTPCT in the last 72 hours. Urinalysis    Component Value Date/Time   COLORURINE DARK YELLOW 08/19/2019 1127   APPEARANCEUR CLEAR 08/19/2019 1127   APPEARANCEUR Clear 03/10/2018 0804   LABSPEC 1.023 08/19/2019 1127   PHURINE 5.5 08/19/2019 1127   GLUCOSEU NEGATIVE 08/19/2019 1127   HGBUR NEGATIVE 08/19/2019 1127   BILIRUBINUR Negative 03/10/2018 0804   KETONESUR TRACE (A) 08/19/2019 1127   PROTEINUR TRACE (A) 08/19/2019 1127   NITRITE  NEGATIVE 08/19/2019 1127   LEUKOCYTESUR 1+ (A) 08/19/2019 1127   Sepsis Labs Invalid input(s): PROCALCITONIN,  WBC,  LACTICIDVEN Microbiology Recent Results (from the past 240 hour(s))  SARS CORONAVIRUS 2 (TAT 6-24 HRS) Nasopharyngeal Nasopharyngeal Swab     Status: None   Collection Time: 08/19/19  6:51 PM   Specimen: Nasopharyngeal Swab  Result Value Ref Range Status   SARS Coronavirus 2 NEGATIVE NEGATIVE Final    Comment: (NOTE) SARS-CoV-2 target nucleic acids are NOT DETECTED. The SARS-CoV-2 RNA is generally detectable in upper and lower respiratory specimens during the acute phase of infection. Negative results do not preclude SARS-CoV-2 infection, do not rule out co-infections with other pathogens, and should not be used as the sole basis for treatment or other patient management decisions. Negative results must be combined with clinical observations, patient history, and epidemiological information. The expected result is Negative. Fact Sheet for Patients: SugarRoll.be Fact Sheet for Healthcare Providers: https://www.woods-mathews.com/ This test is not yet approved or cleared by the [place] FDA and  has been authorized for detection and/or diagnosis of SARS-CoV-2 by FDA under an Emergency Use Authorization (EUA). This EUA will remain  in effect (meaning this test can be used) for the duration of the COVID-19 declaration under Section 56 4(b)(1) of the Act, 21 U.S.C. section 360bbb-3(b)(1), unless the authorization is terminated or revoked sooner. Performed at [facility], [place] [address] [place], [place] [address]      Time coordinating discharge: Over 30 minutes  SIGNED:   Ezekiel Slocumb, DO [facility] 08/21/2019, 9:14 AM   If 7PM-7AM, please contact night-coverage [email or web address]

## 2019-08-21 NOTE — Telephone Encounter (Signed)
-----   Message from Wellington Hampshire, MD sent at 08/21/2019  9:19 AM EST ----- TCM follow-up needed with Dr. Rockey Situ or APP in 1 to 2 weeks.

## 2019-08-21 NOTE — Telephone Encounter (Signed)
Patient contacted regarding discharge from [facility] on 08/21/19.  Patient understands to follow up with provider Laurann [place], NP on 3/19 at [place] [facility]. Patient understands discharge instructions? yes Patient understands medications and regiment? yes Patient understands to bring all medications to this visit? yes

## 2019-08-21 NOTE — Plan of Care (Signed)
  Problem: Cardiac: Goal: Vascular access site(s) Level 0-1 will be maintained Outcome: Progressing   

## 2019-08-22 LAB — HIV-1/2 AB - DIFFERENTIATION
HIV 1 Ab: NEGATIVE
HIV 2 Ab: NEGATIVE
Note: NEGATIVE

## 2019-08-22 LAB — HIV-1 RNA QUANT-NO REFLEX-BLD
HIV 1 RNA Quant: <20 copies/mL
LOG10 HIV-1 RNA: UNDETERMINED log10copy/mL

## 2019-08-22 LAB — RNA QUALITATIVE: HIV 1 RNA Qualitative: 1

## 2019-08-24 ENCOUNTER — Telehealth: Payer: Self-pay | Admitting: Internal Medicine

## 2019-08-24 NOTE — Telephone Encounter (Signed)
Cancel fasting lab appt 3/17 she had labs in hospital  Keep appt 08/27/19  Thanks Lynd

## 2019-08-25 NOTE — Progress Notes (Signed)
Office Visit    Patient Name: Maureen Pena Date of Encounter: 08/28/2019  Primary Care Provider:  McLean-Scocuzza, Nino Glow, MD Primary Cardiologist:  Ida Rogue, MD Electrophysiologist:  None   Chief Complaint    Maureen Pena is a 64 y.o. female with a hx of CAD, HLD, tobacco use, hypothyroidism presents today for hospital follow up.   Past Medical History    Past Medical History:  Diagnosis Date  . Anxiety   . Barrett's esophagus   . Depression   . Diverticulitis   . History of chicken pox   . Thyroid disease    hypothyroidism  . UTI (urinary tract infection)    Past Surgical History:  Procedure Laterality Date  . ESOPHAGOGASTRODUODENOSCOPY (EGD) WITH PROPOFOL N/A 05/02/2018   Procedure: ESOPHAGOGASTRODUODENOSCOPY (EGD) WITH PROPOFOL;  Surgeon: Lucilla Lame, MD;  Location: [facility];  Service: Endoscopy;  Laterality: N/A;  . LEFT HEART CATH AND CORONARY ANGIOGRAPHY N/A 08/20/2019   Procedure: LEFT HEART CATH AND CORONARY ANGIOGRAPHY;  Surgeon: Minna Merritts, MD;  Location: [facility];  Service: Cardiovascular;  Laterality: N/A;  . TUBAL LIGATION      Allergies  No Known Allergies  History of Present Illness    Maureen Pena is a 64 y.o. female with a hx of CAD s/p STEMI, HLD, tobacco use, hypothyroidism, mitral regurgitation, strong family hx of CAD. She was last seen while hospitalized.  Seen by her primary care noting chest pain on 08/19/19. As troponin was elevated, she was advised to proceed to the ED. Diagnosed with inferior/inferolateral STEMI with initial event March 7. Cardiac cath 08/20/19 with occluded mid L Cx. Echo 08/20/19 LVEF 45-50%, inferior wall hypokinesis, normal RV size and function, mild to moderate MR. Due to late presentation, recommended for medical therapy with DAPT x1 year, high-intensity statin. Noted bradycardia at baseline which limited further addition/titration of beta blocker, ACE/ARB.    Reports no chest pain, pressure, tightness since hospital discharge. Reports no shortness of breath nor dyspnea on exertion. Tells me she had pain between her shoulder blades on Sunday prior to her cardiac cath, which is likely her anginal equivalent.  Reports diarrhea since starting her new medications. We discussed that this may be a side effect of the Atorvastatin. Discussed importance of statin in setting of CAD. She tells me it is improving and wishes to continue. If symptoms persist discussed that we could transition to Crestor to see if it alleviates the diarrhea.   Tells me she is having episodes of low blood sugar. She notices this as she begins to feel weak and shaky. Endorses eating regular meals and tells me it happens within 2 hours of finishing her last meal.  EKGs/Labs/Other Studies Reviewed:   The following studies were reviewed today:  Echo 08/20/19  1. Left ventricular ejection fraction, by estimation, is 45 to 50%. The  left ventricle has mildly decreased function. The left ventricle  demonstrates regional wall motion abnormalities (inferior wall  hypokinesis).   2. Right ventricular systolic function is normal. The right ventricular  size is normal. Tricuspid regurgitation signal is inadequate for assessing  PA pressure.   3. Mild to moderate mitral valve regurgitation.   LHC 08/20/19  Prox Cx to Mid Cx lesion is 100% stenosed.  The left ventricular ejection fraction is 45-50% by visual estimate.  There is mild left ventricular systolic dysfunction.  LV end diastolic pressure is normal.  There is no mitral valve regurgitation.   EKG:  EKG is  ordered today.  The ekg ordered today demonstrates SR 69 bpm with noted T wave abnormality in inferior and lateral leads stable compared to recent hospital discharge (TWI lead II, III, V5, V6)  Recent Labs: 08/19/2019: ALT 28; TSH 0.85 08/20/2019: BUN 20; Creatinine, Ser 0.79; Potassium 3.9; Sodium 139 08/21/2019:  Hemoglobin 13.5; Platelets 192  Recent Lipid Panel    Component Value Date/Time   CHOL 196 08/20/2019 0121   TRIG 113 08/20/2019 0121   HDL 51 08/20/2019 0121   CHOLHDL 3.8 08/20/2019 0121   VLDL 23 08/20/2019 0121   LDLCALC 122 (H) 08/20/2019 0121   LDLCALC 141 (H) 03/10/2018 0804   Home Medications   Current Meds  Medication Sig  . aspirin EC 81 MG EC tablet Take 1 tablet (81 mg total) by mouth daily.  Marland Kitchen atorvastatin (LIPITOR) 80 MG tablet Take 1 tablet (80 mg total) by mouth daily at 6 PM.  . Cholecalciferol (VITAMIN D-3) 5000 units TABS Take by mouth.  . clopidogrel (PLAVIX) 75 MG tablet Take 1 tablet (75 mg total) by mouth daily.  Marland Kitchen escitalopram (LEXAPRO) 10 MG tablet TAKE 1 TABLET BY MOUTH EVERY DAY  . levothyroxine (SYNTHROID) 125 MCG tablet Take 1 tablet (125 mcg total) by mouth daily before breakfast. 30 minutes before breakfast. appt further refills  . nitroGLYCERIN (NITROSTAT) 0.4 MG SL tablet Place 1 tablet (0.4 mg total) under the tongue every 5 (five) minutes x 3 doses as needed for chest pain.  . Omega-3 1000 MG CAPS Take 1,000 mg by mouth daily.     Review of Systems   Review of Systems  Constitution: Negative for chills, fever and malaise/fatigue.  Cardiovascular: Negative for chest pain, dyspnea on exertion, irregular heartbeat, leg swelling, near-syncope, orthopnea, palpitations and syncope.  Respiratory: Negative for cough, shortness of breath and wheezing.   Endocrine:       (+) "low blood sugar"  Gastrointestinal: Positive for diarrhea. Negative for melena, nausea and vomiting.  Genitourinary: Negative for hematuria.  Neurological: Negative for dizziness, light-headedness and weakness.   All other systems reviewed and are otherwise negative except as noted above.  Physical Exam    VS:  BP 102/74 (BP Location: Left Arm, Patient Position: Sitting, Cuff Size: Normal)   Pulse 69   Ht 5\' 6"  (1.676 m)   Wt 181 lb (82.1 kg)   SpO2 98%   BMI 29.21 kg/m  ,  BMI Body mass index is 29.21 kg/m. GEN: Well nourished, well developed, in no acute distress. HEENT: normal. Neck: Supple, no JVD, carotid bruits, or masses. Cardiac: RRR, no murmurs, rubs, or gallops. No clubbing, cyanosis, edema.  Radials/PT 2+ and equal bilaterally.  Respiratory:  Respirations regular and unlabored, clear to auscultation bilaterally. GI: Soft, nontender, nondistended. MS: No deformity or atrophy. Skin: Warm and dry, no rash. R groin site healing appropriately with no hematoma, ecchymosis, no signs of infection.  Neuro:  Strength and sensation are intact. Psych: Normal affect.  Assessment & Plan    1. CAD s/p STEMI - Late presenting STEMI 08/20/19 with cath showing prox-mid LCx 100% stenosed with collaterals recommended for medical management. LVEF by echo 45-50%. R groin site with no ecchymosis, hematoma, healing well. No recurrent anginal symptoms since discharge, her anginal equivalent is pain between her shoulder blades.   GDMT includes Aspirin & Plavix (DAPT recommended x12 months) and high-intensity statin. No beta blocker or ACE/ARB secondary to low BP and bradycardia.  Cardiac rehab referral placed during hospitalization, encouraged her to participate.  She notes low blood sugar episodes (feels shaky) and wonders if Plavix could be causing. Reviewed common side effects of Plavix including bruising, bleeding and education provided. Low suspicion Plavix is causing this problem. Recommend she continue small regular meals to prevent low blood sugar.  Heart healthy diet and avoidance of fried foods recommended.  Regular cardiovascular exercise encouraged.  2. Mild to moderate MR by echo 08/20/19 - No SOB nor volume overload. Continue  Optimal BP management - BP well controlled today requiring no antihypertensive medications.   3. HLD, LDL goal <70 - Lipid panel 08/20/19 with LDL 122. Atorvastatin 80 mg subsequently started. Repeat lipid/liver at follow up. Discussed  transition to Crestor if her diarrhea does not improve as this may be a side effect of Lipitor.  4. Tobacco use - Smoking cessation encouraged. Recommend utilization of [id number].  Disposition: Follow up in 2 month(s) with Dr. Rockey Situ.   Loel Dubonnet, NP 08/28/2019, 9:00 AM

## 2019-08-26 ENCOUNTER — Other Ambulatory Visit: Payer: BC Managed Care – PPO

## 2019-08-27 ENCOUNTER — Telehealth (INDEPENDENT_AMBULATORY_CARE_PROVIDER_SITE_OTHER): Payer: BC Managed Care – PPO | Admitting: Internal Medicine

## 2019-08-27 ENCOUNTER — Encounter: Payer: Self-pay | Admitting: Internal Medicine

## 2019-08-27 VITALS — Ht 66.0 in | Wt 178.0 lb

## 2019-08-27 DIAGNOSIS — I2510 Atherosclerotic heart disease of native coronary artery without angina pectoris: Secondary | ICD-10-CM | POA: Diagnosis not present

## 2019-08-27 NOTE — Progress Notes (Signed)
Virtual Visit via Video Note  I connected with Maureen Pena   on 08/27/19 at 10:50 AM EDT by a video enabled telemedicine application and verified that I am speaking with the correct person using two identifiers.  Location patient: home Location provider:work or home office Persons participating in the virtual visit: patient, provider  I discussed the limitations of evaluation and management by telemedicine and the availability of in person appointments. The patient expressed understanding and agreed to proceed.   HPI: 1. prox cx and mid Cx 100% blockage on medical tx and appt cards NP 08/28/19 at 8:30 she wants also appt with D.r Rockey Situ and has ?s and future management I.e medical therapy chronically or further intervention and sch 08/28/19 for covid 19 vx can she get this  No chest pain  Gained 3 lbs due to IVF in hospital but overall doing well  Stopped smoking as well  She did not start BB lopressor 12.5 mg bid due to low BP and HR She is taking aspirin and plavix    ROS: See pertinent positives and negatives per HPI.  Past Medical History:  Diagnosis Date  . Anxiety   . Barrett's esophagus   . Depression   . Diverticulitis   . History of chicken pox   . Thyroid disease    hypothyroidism  . UTI (urinary tract infection)     Past Surgical History:  Procedure Laterality Date  . ESOPHAGOGASTRODUODENOSCOPY (EGD) WITH PROPOFOL N/A 05/02/2018   Procedure: ESOPHAGOGASTRODUODENOSCOPY (EGD) WITH PROPOFOL;  Surgeon: Lucilla Lame, MD;  Location: [facility];  Service: Endoscopy;  Laterality: N/A;  . LEFT HEART CATH AND CORONARY ANGIOGRAPHY N/A 08/20/2019   Procedure: LEFT HEART CATH AND CORONARY ANGIOGRAPHY;  Surgeon: Minna Merritts, MD;  Location: [facility];  Service: Cardiovascular;  Laterality: N/A;  . TUBAL LIGATION      Family History  Problem Relation Age of Onset  . Depression Mother   . Anxiety disorder Mother   . Schizophrenia Mother   .  Paranoid behavior Mother   . Stroke Mother   . COPD Father   . Early death Father   . Hearing loss Father   . Heart disease Father   . CAD Father        s/p cabg x 3   . Depression Brother   . COPD Brother   . Hypertension Brother   . Kidney disease Brother   . Breast cancer Neg Hx     SOCIAL HX:  Kids  No guns  Wears seat belt  Safe in relationship  Smoker  Lives with Medical laboratory scientific officer    Current Outpatient Medications:  .  aspirin EC 81 MG EC tablet, Take 1 tablet (81 mg total) by mouth daily., Disp: 30 tablet, Rfl: 1 .  atorvastatin (LIPITOR) 80 MG tablet, Take 1 tablet (80 mg total) by mouth daily at 6 PM., Disp: 30 tablet, Rfl: 1 .  Cholecalciferol (VITAMIN D-3) 5000 units TABS, Take by mouth., Disp: , Rfl:  .  clopidogrel (PLAVIX) 75 MG tablet, Take 1 tablet (75 mg total) by mouth daily., Disp: 30 tablet, Rfl: 1 .  escitalopram (LEXAPRO) 10 MG tablet, TAKE 1 TABLET BY MOUTH EVERY DAY, Disp: 90 tablet, Rfl: 0 .  levothyroxine (SYNTHROID) 125 MCG tablet, Take 1 tablet (125 mcg total) by mouth daily before breakfast. 30 minutes before breakfast. appt further refills, Disp: 90 tablet, Rfl: 0 .  Omega-3 1000 MG CAPS, Take 1,000 mg by mouth daily. ,  Disp: , Rfl:  .  nitroGLYCERIN (NITROSTAT) 0.4 MG SL tablet, Place 1 tablet (0.4 mg total) under the tongue every 5 (five) minutes x 3 doses as needed for chest pain. (Patient not taking: Reported on 08/27/2019), Disp: 30 tablet, Rfl: 1  EXAM:  VITALS per patient if applicable:  GENERAL: alert, oriented, appears well and in no acute distress  HEENT: atraumatic, conjunttiva clear, no obvious abnormalities on inspection of external nose and ears  NECK: normal movements of the head and neck  LUNGS: on inspection no signs of respiratory distress, breathing rate appears normal, no obvious gross SOB, gasping or wheezing  CV: no obvious cyanosis  MS: moves all visible extremities without noticeable abnormality  PSYCH/NEURO: pleasant  and cooperative, no obvious depression or anxiety, speech and thought processing grossly intact  ASSESSMENT AND PLAN:  Discussed the following assessment and plan:  Coronary artery disease involving native coronary artery of native heart without angina pectoris prox cx and mid Cx 100% blockage on medical tx and appt cards NP 08/28/19 at 8:30 she wants also appt with D.r Rockey Situ and has ?s and future management I.e medical therapy chronically or further intervention and sch 08/28/19 for covid 19 vx can she get this  No chest pain  Gained 3 lbs due to IVF in hospital but overall doing well  Stopped smoking as well   HM Flu shot utd pna 23 think about  9/27/10Tdap  Disc shingrix todaywill wait until after flu shot Declines MMR and hep B check   05/21/19 mammogramnegative  Pap at f/u h/o abnormal x 1 -pap 04/11/18 neg neg HPV due in 04/11/21  Colonoscopy had 08/27/11 and EGD due repeat h/o barretts; EGD with Dr. Allen Norris 11/22/19no barretts   rec smoking cessation smoking 10-12 cig/qd no FH lung cancer never smoked more than this  Derm sch 11/18   Given cholesterol handout   -we discussed possible serious and likely etiologies, options for evaluation and workup, limitations of telemedicine visit vs in person visit, treatment, treatment risks and precautions. Pt prefers to treat via telemedicine empirically rather then risking or undertaking an in person visit at this moment. Patient agrees to seek prompt in person care if worsening, new symptoms arise, or if is not improving with treatment.   I discussed the assessment and treatment plan with the patient. The patient was provided an opportunity to ask questions and all were answered. The patient agreed with the plan and demonstrated an understanding of the instructions.   The patient was advised to call back or seek an in-person evaluation if the symptoms worsen or if the condition fails to improve as anticipated.  Time spent 20  minutes  Delorise Jackson, MD

## 2019-08-28 ENCOUNTER — Encounter: Payer: Self-pay | Admitting: Family

## 2019-08-28 ENCOUNTER — Ambulatory Visit (INDEPENDENT_AMBULATORY_CARE_PROVIDER_SITE_OTHER): Payer: BC Managed Care – PPO | Admitting: Family

## 2019-08-28 ENCOUNTER — Ambulatory Visit: Payer: BC Managed Care – PPO | Attending: Internal Medicine

## 2019-08-28 ENCOUNTER — Other Ambulatory Visit: Payer: Self-pay

## 2019-08-28 VITALS — BP 102/74 | HR 69 | Ht 66.0 in | Wt 181.0 lb

## 2019-08-28 DIAGNOSIS — I2510 Atherosclerotic heart disease of native coronary artery without angina pectoris: Secondary | ICD-10-CM

## 2019-08-28 DIAGNOSIS — I340 Nonrheumatic mitral (valve) insufficiency: Secondary | ICD-10-CM

## 2019-08-28 DIAGNOSIS — E785 Hyperlipidemia, unspecified: Secondary | ICD-10-CM | POA: Diagnosis not present

## 2019-08-28 DIAGNOSIS — Z23 Encounter for immunization: Secondary | ICD-10-CM

## 2019-08-28 MED ORDER — CLOPIDOGREL BISULFATE 75 MG PO TABS: 75.0000 mg | ORAL_TABLET | Freq: Every day | ORAL | 1 refills | Status: DC

## 2019-08-28 MED ORDER — ASPIRIN 81 MG PO TBEC: 81.0000 mg | DELAYED_RELEASE_TABLET | Freq: Every day | ORAL | 3 refills | Status: AC

## 2019-08-28 NOTE — Patient Instructions (Signed)
Medication Instructions:  Your physician recommends that you continue on your current medications as directed. Please refer to the Current Medication list given to you today.  *If you need a refill on your cardiac medications before your next appointment, please call your pharmacy*   Lab Work: None If you have labs (blood work) drawn today and your tests are completely normal, you will receive your results only by: Marland Kitchen MyChart Message (if you have MyChart) OR . A paper copy in the mail If you have any lab test that is abnormal or we need to change your treatment, we will call you to review the results.   Testing/Procedures: None   Follow-Up: At [facility], you and your health needs are our priority.  As part of our continuing mission to provide you with exceptional heart care, we have created designated Provider Care Teams.  These Care Teams include your primary Cardiologist (physician) and Advanced Practice Providers (APPs -  Physician Assistants and Nurse Practitioners) who all work together to provide you with the care you need, when you need it.  We recommend signing up for the patient portal called "MyChart".  Sign up information is provided on this After Visit Summary.  MyChart is used to connect with patients for Virtual Visits (Telemedicine).  Patients are able to view lab/test results, encounter notes, upcoming appointments, etc.  Non-urgent messages can be sent to your provider as well.   To learn more about what you can do with MyChart, go to [email or web address].    Your next appointment:   6-8 week(s)  The format for your next appointment:   In Person  Provider:   Ida Rogue, MD   Other Instructions Atorvastatin tablets What is this medicine? ATORVASTATIN (a TORE va sta tin) is known as a HMG-CoA reductase inhibitor or 'statin'. It lowers the level of cholesterol and triglycerides in the blood. This drug may also reduce the risk of heart attack, stroke, or  other health problems in patients with risk factors for heart disease. Diet and lifestyle changes are often used with this drug. This medicine may be used for other purposes; ask your health care provider or pharmacist if you have questions. COMMON BRAND NAME(S): Lipitor What should I tell my health care provider before I take this medicine? They need to know if you have any of these conditions:  diabetes  if you often drink alcohol  history of stroke  kidney disease  liver disease  muscle aches or weakness  thyroid disease  an unusual or allergic reaction to atorvastatin, other medicines, foods, dyes, or preservatives  pregnant or trying to get pregnant  breast-feeding How should I use this medicine? Take this medicine by mouth with a glass of water. Follow the directions on the prescription label. You can take it with or without food. If it upsets your stomach, take it with food. Do not take with grapefruit juice. Take your medicine at regular intervals. Do not take it more often than directed. Do not stop taking except on your doctor's advice. Talk to your pediatrician regarding the use of this medicine in children. While this drug may be prescribed for children as young as 10 for selected conditions, precautions do apply. Overdosage: If you think you have taken too much of this medicine contact a poison control center or emergency room at once. NOTE: This medicine is only for you. Do not share this medicine with others. What if I miss a dose? If you miss a dose, take it  as soon as you can. If your next dose is to be taken in less than 12 hours, then do not take the missed dose. Take the next dose at your regular time. Do not take double or extra doses. What may interact with this medicine? Do not take this medicine with any of the following medications:  dasabuvir; ombitasvir; paritaprevir; ritonavir  ombitasvir; paritaprevir; ritonavir  posaconazole  red yeast rice This  medicine may also interact with the following medications:  alcohol  birth control pills  certain antibiotics like erythromycin and clarithromycin  certain antivirals for HIV or hepatitis  certain medicines for cholesterol like fenofibrate, gemfibrozil, and niacin  certain medicines for fungal infections like ketoconazole and itraconazole  colchicine  cyclosporine  digoxin  grapefruit juice  rifampin This list may not describe all possible interactions. Give your health care provider a list of all the medicines, herbs, non-prescription drugs, or dietary supplements you use. Also tell them if you smoke, drink alcohol, or use illegal drugs. Some items may interact with your medicine. What should I watch for while using this medicine? Visit your doctor or health care professional for regular check-ups. You may need regular tests to make sure your liver is working properly. Your health care professional may tell you to stop taking this medicine if you develop muscle problems. If your muscle problems do not go away after stopping this medicine, contact your health care professional. Do not become pregnant while taking this medicine. Women should inform their health care professional if they wish to become pregnant or think they might be pregnant. There is a potential for serious side effects to an unborn child. Talk to your health care professional or pharmacist for more information. Do not breast-feed an infant while taking this medicine. This medicine may increase blood sugar. Ask your healthcare provider if changes in diet or medicines are needed if you have diabetes. If you are going to need surgery or other procedure, tell your doctor that you are using this medicine. This drug is only part of a total heart-health program. Your doctor or a dietician can suggest a low-cholesterol and low-fat diet to help. Avoid alcohol and smoking, and keep a proper exercise schedule. This medicine may  cause a decrease in Co-Enzyme Q-10. You should make sure that you get enough Co-Enzyme Q-10 while you are taking this medicine. Discuss the foods you eat and the vitamins you take with your health care professional. What side effects may I notice from receiving this medicine? Side effects that you should report to your doctor or health care professional as soon as possible:  allergic reactions like skin rash, itching or hives, swelling of the face, lips, or tongue  fever  joint pain  loss of memory  redness, blistering, peeling or loosening of the skin, including inside the mouth  signs and symptoms of high blood sugar such as being more thirsty or hungry or having to urinate more than normal. You may also feel very tired or have blurry vision.  signs and symptoms of liver injury like dark yellow or brown urine; general ill feeling or flu-like symptoms; light-belly pain; unusually weak or tired; yellowing of the eyes or skin  signs and symptoms of muscle injury like dark urine; trouble passing urine or change in the amount of urine; unusually weak or tired; muscle pain or side or back pain Side effects that usually do not require medical attention (report to your doctor or health care professional if they continue or  are bothersome):  diarrhea  nausea  stomach pain  trouble sleeping  upset stomach This list may not describe all possible side effects. Call your doctor for medical advice about side effects. You may report side effects to FDA at 1-800-FDA-1088. Where should I keep my medicine? Keep out of the reach of children. Store between 20 and 25 degrees C (68 and 77 degrees F). Throw away any unused medicine after the expiration date. NOTE: This sheet is a summary. It may not cover all possible information. If you have questions about this medicine, talk to your doctor, pharmacist, or health care provider.  2020 Elsevier/Gold Standard (2018-03-19 11:36:16)  Clopidogrel  tablets What is this medicine? CLOPIDOGREL (kloh PID oh grel) helps to prevent blood clots. This medicine is used to prevent heart attack, stroke, or other vascular events in people who are at high risk. This medicine may be used for other purposes; ask your health care provider or pharmacist if you have questions. COMMON BRAND NAME(S): Plavix What should I tell my health care provider before I take this medicine? They need to know if you have any of the following conditions:  bleeding disorders  bleeding in the brain  having surgery  history of stomach bleeding  an unusual or allergic reaction to clopidogrel, other medicines, foods, dyes, or preservatives  pregnant or trying to get pregnant  breast-feeding How should I use this medicine? Take this medicine by mouth with a glass of water. Follow the directions on the prescription label. You may take this medicine with or without food. If it upsets your stomach, take it with food. Take your medicine at regular intervals. Do not take it more often than directed. Do not stop taking except on your doctor's advice. A special MedGuide will be given to you by the pharmacist with each prescription and refill. Be sure to read this information carefully each time. Talk to your pediatrician regarding the use of this medicine in children. Special care may be needed. Overdosage: If you think you have taken too much of this medicine contact a poison control center or emergency room at once. NOTE: This medicine is only for you. Do not share this medicine with others. What if I miss a dose? If you miss a dose, take it as soon as you can. If it is almost time for your next dose, take only that dose. Do not take double or extra doses. What may interact with this medicine? Do not take this medicine with the following medications:  dasabuvir; ombitasvir; paritaprevir; ritonavir  defibrotide  selexipag This medicine may also interact with the  following medications:  certain medicines that treat or prevent blood clots like warfarin  narcotic medicines for pain  NSAIDs, medicines for pain and inflammation, like ibuprofen or naproxen  repaglinide  SNRIs, medicines for depression, like desvenlafaxine, duloxetine, levomilnacipran, venlafaxine  SSRIs, medicines for depression, like citalopram, escitalopram, fluoxetine, fluvoxamine, paroxetine, sertraline  stomach acid blockers like cimetidine, esomeprazole, omeprazole This list may not describe all possible interactions. Give your health care provider a list of all the medicines, herbs, non-prescription drugs, or dietary supplements you use. Also tell them if you smoke, drink alcohol, or use illegal drugs. Some items may interact with your medicine. What should I watch for while using this medicine? Visit your doctor or health care professional for regular check-ups. Do not stop taking your medicine unless your doctor tells you to. Notify your doctor or health care professional and seek emergency treatment if you develop  breathing problems; changes in vision; chest pain; severe, sudden headache; pain, swelling, warmth in the leg; trouble speaking; sudden numbness or weakness of the face, arm or leg. These can be signs that your condition has gotten worse. If you are going to have surgery or dental work, tell your doctor or health care professional that you are taking this medicine. Certain genetic factors may reduce the effect of this medicine. Your doctor may use genetic tests to determine treatment. Only take aspirin if you are instructed to. Low doses of aspirin are used with this medicine to treat some conditions. Taking aspirin with this medicine can increase your risk of bleeding so you must be careful. Talk to your doctor or pharmacist if you have questions. What side effects may I notice from receiving this medicine? Side effects that you should report to your doctor or health  care professional as soon as possible:  allergic reactions like skin rash, itching or hives, swelling of the face, lips, or tongue  signs and symptoms of bleeding such as bloody or black, tarry stools; red or dark-brown urine; spitting up blood or brown material that looks like coffee grounds; red spots on the skin; unusual bruising or bleeding from the eye, gums, or nose  signs and symptoms of a blood clot such as breathing problems; changes in vision; chest pain; severe, sudden headache; pain, swelling, warmth in the leg; trouble speaking; sudden numbness or weakness of the face, arm or leg  signs and symptoms of low blood sugar such as feeling anxious; confusion; dizziness; increased hunger; unusually weak or tired; increased sweating; shakiness; cold, clammy skin; irritable; headache; blurred vision; fast heartbeat; loss of consciousness Side effects that usually do not require medical attention (report to your doctor or health care professional if they continue or are bothersome):  constipation  diarrhea  headache  upset stomach This list may not describe all possible side effects. Call your doctor for medical advice about side effects. You may report side effects to FDA at 1-800-FDA-1088. Where should I keep my medicine? Keep out of the reach of children. Store at room temperature of 59 to 86 degrees F (15 to 30 degrees C). Throw away any unused medicine after the expiration date. NOTE: This sheet is a summary. It may not cover all possible information. If you have questions about this medicine, talk to your doctor, pharmacist, or health care provider.  2020 Elsevier/Gold Standard (2017-10-28 15:03:38)

## 2019-08-28 NOTE — Progress Notes (Signed)
   [facility] Vaccination [facility]  Name:  Maureen Pena    MRN: [id number] DOB: Feb 24, 1956  08/28/2019  [name] was observed post Covid-19 immunization for 15 minutes without incident. She was provided with Vaccine Information Sheet and instruction to access the V-Safe system.   [name] was instructed to call 911 with any severe reactions post vaccine: Marland Kitchen Difficulty breathing  . Swelling of face and throat  . A fast heartbeat  . A bad rash all over body  . Dizziness and weakness   Immunizations Administered    Name Date Dose VIS Date Route   Pfizer COVID-19 Vaccine 08/28/2019 12:39 PM 0.3 mL 05/22/2019 Intramuscular   Manufacturer: [place]   Lot: [id number]   [place]: [id number]

## 2019-08-31 ENCOUNTER — Other Ambulatory Visit: Payer: Self-pay

## 2019-08-31 ENCOUNTER — Encounter: Payer: Self-pay | Admitting: Psychiatry

## 2019-08-31 ENCOUNTER — Ambulatory Visit (INDEPENDENT_AMBULATORY_CARE_PROVIDER_SITE_OTHER): Payer: BC Managed Care – PPO | Admitting: Psychiatry

## 2019-08-31 DIAGNOSIS — F3342 Major depressive disorder, recurrent, in full remission: Secondary | ICD-10-CM

## 2019-08-31 DIAGNOSIS — F17200 Nicotine dependence, unspecified, uncomplicated: Secondary | ICD-10-CM | POA: Diagnosis not present

## 2019-08-31 MED ORDER — ESCITALOPRAM OXALATE 10 MG PO TABS: 10.0000 mg | ORAL_TABLET | Freq: Every day | ORAL | 1 refills | Status: DC

## 2019-08-31 MED ORDER — TICAGRELOR 90 MG PO TABS: 90.0000 mg | ORAL_TABLET | Freq: Two times a day (BID) | ORAL | 11 refills | Status: DC

## 2019-08-31 NOTE — Progress Notes (Signed)
Provider Location : [facility] Patient Location : Home  Virtual Visit via Telephone Note  I connected with Maureen Pena on 08/31/19 at  2:40 PM EDT by telephone and verified that I am speaking with the correct person using two identifiers.   I discussed the limitations, risks, security and privacy concerns of performing an evaluation and management service by telephone and the availability of in person appointments. I also discussed with the patient that there may be a patient responsible charge related to this service. The patient expressed understanding and agreed to proceed.      I discussed the assessment and treatment plan with the patient. The patient was provided an opportunity to ask questions and all were answered. The patient agreed with the plan and demonstrated an understanding of the instructions.   The patient was advised to call back or seek an in-person evaluation if the symptoms worsen or if the condition fails to improve as anticipated.     [facility]ub[facility] MD OP Progress Note  08/31/2019 6:11 PM [name]  MRN:  [id number]  Chief Complaint:  Chief Complaint    Follow-up     HPI: Maureen Pena is a 64 year old Caucasian female, employed, divorced, lives in [place], has a history of depression, hypothyroidism was evaluated by phone today.  Patient could not connect by video.  Patient was last seen on 06/16/2018.  Patient today returns reporting that she has been stable with her mood symptoms except for boredom due to the current pandemic restrictions.  She reports she cardiac problems recently and is currently following up with her providers for the same.  She developed hypoglycemic episodes from Plavix and currently is on a new medication which she has not started yet.  She reports she has been compliant on Lexapro as prescribed.  She denies side effects.  She describes her mood symptoms are stable.  She denies any suicidality, homicidality or perceptual  disturbances.  She has stopped smoking however does report occasional craving.  So far she is doing okay.  Patient denies any other concerns today. Visit Diagnosis:    ICD-10-CM   1. MDD (major depressive disorder), recurrent, in full remission ([place])  F33.42 escitalopram (LEXAPRO) 10 MG tablet  2. Tobacco use disorder  F17.200     Past Psychiatric History: I have reviewed past psychiatric history from my progress note on 02/06/2018.  Past trials of Lexapro, hydroxyzine.  Past Medical History:  Past Medical History:  Diagnosis Date  . Anxiety   . Barrett's esophagus   . Depression   . Diverticulitis   . History of chicken pox   . Thyroid disease    hypothyroidism  . UTI (urinary tract infection)     Past Surgical History:  Procedure Laterality Date  . ESOPHAGOGASTRODUODENOSCOPY (EGD) WITH PROPOFOL N/A 05/02/2018   Procedure: ESOPHAGOGASTRODUODENOSCOPY (EGD) WITH PROPOFOL;  Surgeon: Lucilla Lame, MD;  Location: [facility];  Service: Endoscopy;  Laterality: N/A;  . LEFT HEART CATH AND CORONARY ANGIOGRAPHY N/A 08/20/2019   Procedure: LEFT HEART CATH AND CORONARY ANGIOGRAPHY;  Surgeon: Minna Merritts, MD;  Location: [facility];  Service: Cardiovascular;  Laterality: N/A;  . TUBAL LIGATION      Family Psychiatric History: I have reviewed family psychiatric history from my progress note on 02/06/2018.  Family History:  Family History  Problem Relation Age of Onset  . Depression Mother   . Anxiety disorder Mother   . Schizophrenia Mother   . Paranoid behavior Mother   . Stroke Mother   .  COPD Father   . Early death Father   . Hearing loss Father   . Heart disease Father   . CAD Father        s/p cabg x 3   . Depression Brother   . COPD Brother   . Hypertension Brother   . Kidney disease Brother   . Breast cancer Neg Hx     Social History: I have reviewed social history from my progress note on 02/06/2018. Social History   Socioeconomic History  .  Marital status: Divorced    Spouse name: Not on file  . Number of children: 2  . Years of education: Not on file  . Highest education level: Bachelor's degree (e.g., BA, AB, BS)  Occupational History    Comment: part time  Tobacco Use  . Smoking status: Current Every Day Smoker    Packs/day: 0.50    Types: Cigarettes  . Smokeless tobacco: Never Used  Substance and Sexual Activity  . Alcohol use: Not Currently    Alcohol/week: 1.0 standard drinks    Types: 1 Glasses of wine per week  . Drug use: Yes    Types: Marijuana  . Sexual activity: Not Currently  Other Topics Concern  . Not on file  Social History Narrative   Kids    No guns    Wears seat belt    Safe in relationship    Smoker    Lives with Lexicographer    Social Determinants of Health   Financial Resource Strain:   . Difficulty of Paying Living Expenses:   Food Insecurity:   . Worried About Charity fundraiser in the Last Year:   . Arboriculturist in the Last Year:   Transportation Needs:   . Film/video editor (Medical):   Marland Kitchen Lack of Transportation (Non-Medical):   Physical Activity:   . Days of Exercise per Week:   . Minutes of Exercise per Session:   Stress:   . Feeling of Stress :   Social Connections:   . Frequency of Communication with Friends and Family:   . Frequency of Social Gatherings with Friends and Family:   . Attends Religious Services:   . Active Member of Clubs or Organizations:   . Attends Archivist Meetings:   Marland Kitchen Marital Status:     Allergies: No Known Allergies  Metabolic Disorder Labs: No results found for: HGBA1C, MPG No results found for: PROLACTIN Lab Results  Component Value Date   CHOL 196 08/20/2019   TRIG 113 08/20/2019   HDL 51 08/20/2019   CHOLHDL 3.8 08/20/2019   VLDL 23 08/20/2019   LDLCALC 122 (H) 08/20/2019   LDLCALC 141 (H) 03/10/2018   Lab Results  Component Value Date   TSH 0.85 08/19/2019   TSH 5.65 (H) 03/10/2018    Therapeutic  Level Labs: No results found for: LITHIUM No results found for: VALPROATE No components found for:  CBMZ  Current Medications: Current Outpatient Medications  Medication Sig Dispense Refill  . aspirin 81 MG EC tablet Take 1 tablet (81 mg total) by mouth daily. 90 tablet 3  . atorvastatin (LIPITOR) 80 MG tablet Take 1 tablet (80 mg total) by mouth daily at 6 PM. 30 tablet 1  . Cholecalciferol (VITAMIN D-3) 5000 units TABS Take by mouth.    . escitalopram (LEXAPRO) 10 MG tablet Take 1 tablet (10 mg total) by mouth daily. 90 tablet 1  . levothyroxine (SYNTHROID) 125 MCG  tablet Take 1 tablet (125 mcg total) by mouth daily before breakfast. 30 minutes before breakfast. appt further refills 90 tablet 0  . nitroGLYCERIN (NITROSTAT) 0.4 MG SL tablet Place 1 tablet (0.4 mg total) under the tongue every 5 (five) minutes x 3 doses as needed for chest pain. 30 tablet 1  . Omega-3 1000 MG CAPS Take 1,000 mg by mouth daily.     . ticagrelor (BRILINTA) 90 MG TABS tablet Take 1 tablet (90 mg total) by mouth 2 (two) times daily. 60 tablet 11   No current facility-administered medications for this visit.     Musculoskeletal: Strength & Muscle Tone: UTA Gait & Station: UTA Patient leans: N/A  Psychiatric Specialty Exam: Review of Systems  Psychiatric/Behavioral: Negative for agitation, behavioral problems, confusion, decreased concentration, dysphoric mood, hallucinations, self-injury, sleep disturbance and suicidal ideas. The patient is not nervous/anxious and is not hyperactive.   All other systems reviewed and are negative.   There were no vitals taken for this visit.There is no height or weight on file to calculate BMI.  General Appearance: UTA  Eye Contact:  UTA  Speech:  Clear and Coherent  Volume:  Normal  Mood:  Euthymic  Affect:  UTA  Thought Process:  Goal Directed and Descriptions of Associations: Intact  Orientation:  Full (Time, Place, and Person)  Thought Content: Logical    Suicidal Thoughts:  No  Homicidal Thoughts:  No  Memory:  Immediate;   Fair Recent;   Fair Remote;   Fair  Judgement:  Fair  Insight:  Fair  Psychomotor Activity:  UTA  Concentration:  Concentration: Fair and Attention Span: Fair  Recall:  [employer] of Knowledge: Fair  Language: Fair  Akathisia:  No  Handed:  Right  AIMS (if indicated): UTA  Assets:  Communication Skills Desire for Improvement Housing Social Support  ADL's:  Intact  Cognition: WNL  Sleep:  Fair   Screenings: GAD-7     Video Visit from 08/27/2019 in [facility] Office Visit from 08/19/2019 in [facility] Office Visit from 02/17/2018 in [facility]  Total GAD-7 Score  0  0  9    PHQ2-9     Video Visit from 08/27/2019 in [facility] Office Visit from 08/19/2019 in [facility] Office Visit from 02/17/2018 in [facility]  PHQ-2 Total Score  0  0  4  PHQ-9 Total Score  --  --  11       Assessment and Plan: Maureen Pena is a 64 year old Caucasian female, divorced, employed, lives in [place], has a history of depression, GERD, hypothyroidism was evaluated by phone today.  Patient was last seen a year ago.  Patient today reports that she is stable on her Lexapro however recently had cardiac problems from which she is recovering.  She has has psychosocial stressors of her health as well as the current pandemic.  Patient will benefit from continued medication management and possible psychotherapy sessions as needed.  Plan as noted below.  Plan MDD in remission Lexapro 10 mg p.o. daily CPT as needed  Tobacco use disorder in remission We will monitor closely.  Follow-up in clinic in 3 months or sooner if needed.  I have spent atleast 15 minutes non face to face with patient today. More than 50 % of the time was spent for preparing to see the patient ( e.g., review of test, records ), obtaining and to  review and separately  obtained history , ordering medications and test ,psychoeducation and supportive psychotherapy and care coordination,as well as documenting clinical information in electronic health record. This note was generated in part or whole with voice recognition software. Voice recognition is usually quite accurate but there are transcription errors that can and very often do occur. I apologize for any typographical errors that were not detected and corrected.       Ursula Alert, MD 08/31/2019, 6:11 PM

## 2019-09-04 ENCOUNTER — Encounter: Payer: Self-pay | Admitting: Family

## 2019-09-18 ENCOUNTER — Encounter: Payer: Self-pay | Admitting: Family

## 2019-09-18 MED ORDER — ATORVASTATIN CALCIUM 80 MG PO TABS: 80.0000 mg | ORAL_TABLET | Freq: Every day | ORAL | 1 refills | Status: DC

## 2019-09-23 ENCOUNTER — Ambulatory Visit: Payer: BC Managed Care – PPO | Attending: Internal Medicine

## 2019-09-23 DIAGNOSIS — Z23 Encounter for immunization: Secondary | ICD-10-CM

## 2019-09-23 NOTE — Progress Notes (Signed)
   Covid-19 Vaccination Clinic  Name:  Maureen Pena    MRN: [id number] DOB: 25-Dec-1955  09/23/2019  [name] was observed post Covid-19 immunization for 15 minutes without incident. She was provided with Vaccine Information Sheet and instruction to access the V-Safe system.   [name] was instructed to call 911 with any severe reactions post vaccine: Marland Kitchen Difficulty breathing  . Swelling of face and throat  . A fast heartbeat  . A bad rash all over body  . Dizziness and weakness   Immunizations Administered    Name Date Dose VIS Date Route   Pfizer COVID-19 Vaccine 09/23/2019  9:34 AM 0.3 mL 05/22/2019 Intramuscular   Manufacturer: [place]   Lot: [id number]   [place]: [id number]

## 2019-10-13 ENCOUNTER — Other Ambulatory Visit: Payer: Self-pay | Admitting: Family

## 2019-10-25 NOTE — Progress Notes (Signed)
Cardiology Office Note  Date:  10/26/2019   ID:  Maureen Pena, DOB [birth year], MRN [id number]  PCP:  McLean-Scocuzza, Maureen Glow, MD   Chief Complaint  Patient presents with  . office visit    Pt has no complaints. Meds verbally reviewed w/ pt.     HPI:  64 year old woman with history of  smoking,  hyperlipidemia,  strong family history of coronary disease   referred to the emergency room by primary care for elevated troponin/angina Cath  : Severe single-vessel disease proximal to mid left circumflex after OM1 takeoff Late presenting STEMI, initial event August 16, 2019 Who presents for follow up of her CAD  Overall feels well with no symptoms concerning for angina Still smoking, 10 cigarettes a day Tried chantix in the past, stopped 4 months Did not work the second time she tried Chantix Has not tried any other modalities  Active at baseline, Walking 2- 3 miles a day Gardening, active Watching diet No angina when she is active  EKG personally reviewed by myself on todays visit NSr rate 76 bpm, T wave ABN, T wave inv anterlateral inferior  Other past medical history reviewed chest pain August 16, 2019 developed 15 minutes of chest pain radiating through to her back between her scapulas.   did not seek out assistance, Seen by primary care March 10, lab work performed showing elevated D-dimer, elevated troponin  presented to the emergency room, initial EKG concerning for late presenting inferior wall STEMI.     PMH:   has a past medical history of Anxiety, Barrett's esophagus, Depression, Diverticulitis, History of chicken pox, Thyroid disease, and UTI (urinary tract infection).  PSH:    Past Surgical History:  Procedure Laterality Date  . ESOPHAGOGASTRODUODENOSCOPY (EGD) WITH PROPOFOL N/A 05/02/2018   Procedure: ESOPHAGOGASTRODUODENOSCOPY (EGD) WITH PROPOFOL;  Surgeon: Lucilla Lame, MD;  Location: [facility];  Service: Endoscopy;  Laterality: N/A;  . LEFT  HEART CATH AND CORONARY ANGIOGRAPHY N/A 08/20/2019   Procedure: LEFT HEART CATH AND CORONARY ANGIOGRAPHY;  Surgeon: Minna Merritts, MD;  Location: [facility];  Service: Cardiovascular;  Laterality: N/A;  . TUBAL LIGATION      Current Outpatient Medications  Medication Sig Dispense Refill  . aspirin 81 MG EC tablet Take 1 tablet (81 mg total) by mouth daily. 90 tablet 3  . atorvastatin (LIPITOR) 80 MG tablet TAKE 1 TABLET (80 MG TOTAL) BY MOUTH DAILY AT 6 PM. 30 tablet 1  . Cholecalciferol (VITAMIN D-3) 5000 units TABS Take by mouth.    . escitalopram (LEXAPRO) 10 MG tablet Take 1 tablet (10 mg total) by mouth daily. 90 tablet 1  . levothyroxine (SYNTHROID) 125 MCG tablet Take 1 tablet (125 mcg total) by mouth daily before breakfast. 30 minutes before breakfast. appt further refills 90 tablet 0  . nitroGLYCERIN (NITROSTAT) 0.4 MG SL tablet Place 1 tablet (0.4 mg total) under the tongue every 5 (five) minutes x 3 doses as needed for chest pain. 30 tablet 1  . Omega-3 1000 MG CAPS Take 1,000 mg by mouth daily.     . ticagrelor (BRILINTA) 90 MG TABS tablet Take 1 tablet (90 mg total) by mouth 2 (two) times daily. 60 tablet 11   No current facility-administered medications for this visit.     Allergies:   Patient has no known allergies.   Social History:  The patient  reports that she has been smoking cigarettes. She has been smoking about 0.50 packs per day. She has never  used smokeless tobacco. She reports previous alcohol use of about 1.0 standard drinks of alcohol per week. She reports current drug use. Drug: Marijuana.   Family History:   family history includes Anxiety disorder in her mother; CAD in her father; COPD in her brother and father; Depression in her brother and mother; Early death in her father; Hearing loss in her father; Heart disease in her father; Hypertension in her brother; Kidney disease in her brother; Paranoid behavior in her mother; Schizophrenia in her  mother; Stroke in her mother.    Review of Systems: Review of Systems  Constitutional: Negative.   HENT: Negative.   Respiratory: Negative.   Cardiovascular: Negative.   Gastrointestinal: Negative.   Musculoskeletal: Negative.   Neurological: Negative.   Psychiatric/Behavioral: Negative.   All other systems reviewed and are negative.   PHYSICAL EXAM: VS:  BP 108/72 (BP Location: Left Arm, Patient Position: Sitting, Cuff Size: Normal)   Pulse 76   Ht 5\' 6"  (1.676 m)   Wt 176 lb 6 oz (80 kg)   SpO2 98%   BMI 28.47 kg/m  , BMI Body mass index is 28.47 kg/m. GEN: Well nourished, well developed, in no acute distress HEENT: normal Neck: no JVD, carotid bruits, or masses Cardiac: RRR; no murmurs, rubs, or gallops,no edema  Respiratory:  clear to auscultation bilaterally, normal work of breathing GI: soft, nontender, nondistended, + BS MS: no deformity or atrophy Skin: warm and dry, no rash Neuro:  Strength and sensation are intact Psych: euthymic mood, full affect    Recent Labs: 08/19/2019: ALT 28; TSH 0.85 08/20/2019: BUN 20; Creatinine, Ser 0.79; Potassium 3.9; Sodium 139 08/21/2019: Hemoglobin 13.5; Platelets 192    Lipid Panel Lab Results  Component Value Date   CHOL 196 08/20/2019   HDL 51 08/20/2019   LDLCALC 122 (H) 08/20/2019   TRIG 113 08/20/2019      Wt Readings from Last 3 Encounters:  10/26/19 176 lb 6 oz (80 kg)  08/28/19 181 lb (82.1 kg)  08/27/19 178 lb (80.7 kg)       ASSESSMENT AND PLAN:  Problem List Items Addressed This Visit      Cardiology Problems   Coronary artery disease involving native coronary artery of native heart without angina pectoris   Relevant Orders   EKG 12-Lead   STEMI (ST elevation myocardial infarction) ([place]) - Primary   Relevant Orders   EKG 12-Lead    Other Visit Diagnoses    Hyperlipidemia LDL goal <70       Nonrheumatic mitral valve regurgitation         Coronary disease with stable angina Currently with  no symptoms of angina. No further workup at this time. Continue current medication regimen. Currently on aspirin with Brilinta for 1 year  Smoker We have encouraged her to continue to work on weaning her cigarettes and smoking cessation. She will continue to work on this and does not want any assistance with chantix.   Hyperlipidemia Repeat lipid panel ordered today, goal LDL less than 70  Disposition:   F/U  12 months   Total encounter time more than 25 minutes  Greater than 50% was spent in counseling and coordination of care with the patient    Signed, Esmond Plants, M.D., Ph.D. [place], [place]

## 2019-10-26 ENCOUNTER — Ambulatory Visit: Payer: 59 | Admitting: Cardiovascular Disease

## 2019-10-26 ENCOUNTER — Other Ambulatory Visit: Payer: Self-pay

## 2019-10-26 ENCOUNTER — Encounter: Payer: Self-pay | Admitting: Cardiovascular Disease

## 2019-10-26 VITALS — BP 108/72 | HR 76 | Ht 66.0 in | Wt 176.4 lb

## 2019-10-26 DIAGNOSIS — I340 Nonrheumatic mitral (valve) insufficiency: Secondary | ICD-10-CM | POA: Diagnosis not present

## 2019-10-26 DIAGNOSIS — I2121 ST elevation (STEMI) myocardial infarction involving left circumflex coronary artery: Secondary | ICD-10-CM | POA: Diagnosis not present

## 2019-10-26 DIAGNOSIS — I2510 Atherosclerotic heart disease of native coronary artery without angina pectoris: Secondary | ICD-10-CM

## 2019-10-26 DIAGNOSIS — E785 Hyperlipidemia, unspecified: Secondary | ICD-10-CM

## 2019-10-26 NOTE — Patient Instructions (Addendum)
Lipids/LFTS today   Medication Instructions:  No change   If you need a refill on your cardiac medications before your next appointment, please call your pharmacy.    Lab work: No new labs needed   If you have labs (blood work) drawn today and your tests are completely normal, you will receive your results only by: Marland Kitchen MyChart Message (if you have MyChart) OR . A paper copy in the mail If you have any lab test that is abnormal or we need to change your treatment, we will call you to review the results.   Testing/Procedures: No new testing needed   Follow-Up: At [facility], you and your health needs are our priority.  As part of our continuing mission to provide you with exceptional heart care, we have created designated Provider Care Teams.  These Care Teams include your primary Cardiologist (physician) and Advanced Practice Providers (APPs -  Physician Assistants and Nurse Practitioners) who all work together to provide you with the care you need, when you need it.  . You will need a follow up appointment in 6 months   . Providers on your designated Care Team:   . Murray Hodgkins, NP . Christell Faith, PA-C . Marrianne Mood, PA-C  Any Other Special Instructions Will Be Listed Below (If Applicable).  For educational health videos Log in to : [email or web address] Or : [email or web address], password : [email or web address]

## 2019-10-27 LAB — LIPID PANEL
Chol/HDL Ratio: 2.5 ratio (ref 0.0–4.4)
Cholesterol, Total: 119 mg/dL (ref 100–199)
HDL: 47 mg/dL (ref >39)
LDL Chol Calc (NIH): 52 mg/dL (ref 0–99)
Triglycerides: 111 mg/dL (ref 0–149)
VLDL Cholesterol Cal: 20 mg/dL (ref 5–40)

## 2019-10-27 LAB — HEPATIC FUNCTION PANEL
ALT: 135 IU/L — ABNORMAL HIGH (ref 0–32)
AST: 67 IU/L — ABNORMAL HIGH (ref 0–40)
Albumin: 4.3 g/dL (ref 3.8–4.8)
Alkaline Phosphatase: 164 IU/L — ABNORMAL HIGH (ref 48–121)
Bilirubin Total: 0.5 mg/dL (ref 0.0–1.2)
Bilirubin, Direct: 0.14 mg/dL (ref 0.00–0.40)
Total Protein: 6.7 g/dL (ref 6.0–8.5)

## 2019-10-29 ENCOUNTER — Telehealth: Payer: Self-pay

## 2019-10-29 DIAGNOSIS — I2510 Atherosclerotic heart disease of native coronary artery without angina pectoris: Secondary | ICD-10-CM

## 2019-10-29 MED ORDER — ATORVASTATIN CALCIUM 20 MG PO TABS: 20.0000 mg | ORAL_TABLET | Freq: Every day | ORAL | 3 refills | Status: DC

## 2019-10-29 NOTE — Telephone Encounter (Signed)
-----   Message from Minna Merritts, MD sent at 10/28/2019  2:49 PM EDT ----- Cholesterol numbers good Liver numbers mildly elevated Etiology unclear Would avoid all alcohol Decrease Lipitor down to 20 daily, add Zetia 10 mg daily Recheck LFTs 1 month For any right upper quadrant discomfort would let us know, we would order right upper quadrant ultrasound looking for hepatobiliary issues/gallstones

## 2019-10-29 NOTE — Telephone Encounter (Signed)
Patient returning call.

## 2019-10-29 NOTE — Telephone Encounter (Signed)
Attempted to call patient. LMTCB 10/29/2019

## 2019-10-29 NOTE — Telephone Encounter (Signed)
Call to patient to review labs.    Pt verbalized understanding and has no further questions at this time.    Advised pt to call for any further questions or concerns.    She is agreeable with decreasing lipitor but does not want to add another medication at this time.  [facility]able to [facility].

## 2019-11-12 ENCOUNTER — Other Ambulatory Visit: Payer: Self-pay | Admitting: Family

## 2019-11-12 ENCOUNTER — Other Ambulatory Visit: Payer: Self-pay | Admitting: Internal Medicine

## 2019-11-12 DIAGNOSIS — E039 Hypothyroidism, unspecified: Secondary | ICD-10-CM

## 2019-11-12 MED ORDER — LEVOTHYROXINE SODIUM 125 MCG PO TABS: 125.0000 ug | ORAL_TABLET | Freq: Every day | ORAL | 3 refills | Status: DC

## 2019-12-01 ENCOUNTER — Other Ambulatory Visit
Admission: RE | Admit: 2019-12-01 | Discharge: 2019-12-01 | Disposition: A | Discharge status: Home / Self Care | Payer: 59 | Attending: Cardiovascular Disease | Admitting: Cardiovascular Disease

## 2019-12-01 DIAGNOSIS — I2510 Atherosclerotic heart disease of native coronary artery without angina pectoris: Secondary | ICD-10-CM | POA: Insufficient documentation

## 2019-12-01 LAB — HEPATIC FUNCTION PANEL
ALT: 51 U/L — ABNORMAL HIGH (ref 0–44)
AST: 31 U/L (ref 15–41)
Albumin: 4.1 g/dL (ref 3.5–5.0)
Alkaline Phosphatase: 104 U/L (ref 38–126)
Bilirubin, Direct: <0.1 mg/dL (ref 0.0–0.2)
Total Bilirubin: 0.8 mg/dL (ref 0.3–1.2)
Total Protein: 6.7 g/dL (ref 6.5–8.1)

## 2019-12-08 ENCOUNTER — Encounter: Payer: Self-pay | Admitting: Psychiatry

## 2019-12-08 ENCOUNTER — Other Ambulatory Visit: Payer: Self-pay

## 2019-12-08 ENCOUNTER — Telehealth (INDEPENDENT_AMBULATORY_CARE_PROVIDER_SITE_OTHER): Payer: 59 | Admitting: Psychiatry

## 2019-12-08 DIAGNOSIS — F3342 Major depressive disorder, recurrent, in full remission: Secondary | ICD-10-CM

## 2019-12-08 DIAGNOSIS — F17200 Nicotine dependence, unspecified, uncomplicated: Secondary | ICD-10-CM

## 2019-12-08 NOTE — Progress Notes (Signed)
Provider Location : [facility] Patient Location : Home  Virtual Visit via Video Note  I connected with Maureen Pena on 12/08/19 at  2:00 PM EDT by a video enabled telemedicine application and verified that I am speaking with the correct person using two identifiers.   I discussed the limitations of evaluation and management by telemedicine and the availability of in person appointments. The patient expressed understanding and agreed to proceed.  I discussed the assessment and treatment plan with the patient. The patient was provided an opportunity to ask questions and all were answered. The patient agreed with the plan and demonstrated an understanding of the instructions.   The patient was advised to call back or seek an in-person evaluation if the symptoms worsen or if the condition fails to improve as anticipated.   [place] MD OP Progress Note  12/08/2019 2:19 PM Maureen Pena  MRN:  [id number]  Chief Complaint:  Chief Complaint    Follow-up     HPI: Maureen Pena is a 64 year old Caucasian female, employed, divorced, lives in [place], has a history of depression, hypothyroidism was evaluated by telemedicine today.  Patient today reports she is currently doing well.  She denies any significant sadness.  She reports she is able to focus and concentrate and denies any significant mood lability.  She reports sleep is overall okay.  She does struggle with her sleep hygiene.  She takes naps during the day which sometimes can extend for 1-1/2 to 2 hours.  She also goes to bed late and has to wake up early since she has pets.  She reports she wants to try to work on her sleep hygiene and does not need any medications for the same yet.  She continues to smoke cigarettes and is agreeable to using patches, vaping or using gums.  Provided counseling.  Patient is compliant on Lexapro.  She is agreeable to cutting back into 5 mg since her depressive symptoms has been stable for a  while.  She has lost 19 pounds since the past 2 months.  She reports she is currently on this program called [employer].  She continues to want to lose weight and is staying active.  She adopted a kitten and reports she hence has been staying busy taking care of her.    Visit Diagnosis:    ICD-10-CM   1. MDD (major depressive disorder), recurrent, in full remission ([place])  F33.42   2. Tobacco use disorder  F17.200     Past Psychiatric History: I have reviewed past psychiatric history from my progress note on 02/06/2018.  Past trials of Lexapro, hydroxyzine.  Past Medical History:  Past Medical History:  Diagnosis Date  . Anxiety   . Barrett's esophagus   . Depression   . Diverticulitis   . History of chicken pox   . Thyroid disease    hypothyroidism  . UTI (urinary tract infection)     Past Surgical History:  Procedure Laterality Date  . ESOPHAGOGASTRODUODENOSCOPY (EGD) WITH PROPOFOL N/A 05/02/2018   Procedure: ESOPHAGOGASTRODUODENOSCOPY (EGD) WITH PROPOFOL;  Surgeon: Lucilla Lame, MD;  Location: [facility];  Service: Endoscopy;  Laterality: N/A;  . LEFT HEART CATH AND CORONARY ANGIOGRAPHY N/A 08/20/2019   Procedure: LEFT HEART CATH AND CORONARY ANGIOGRAPHY;  Surgeon: Minna Merritts, MD;  Location: [facility];  Service: Cardiovascular;  Laterality: N/A;  . TUBAL LIGATION      Family Psychiatric History: I have reviewed family psychiatric history from my progress note on 02/06/2018.  Family History:  Family History  Problem Relation Age of Onset  . Depression Mother   . Anxiety disorder Mother   . Schizophrenia Mother   . Paranoid behavior Mother   . Stroke Mother   . COPD Father   . Early death Father   . Hearing loss Father   . Heart disease Father   . CAD Father        s/p cabg x 3   . Depression Brother   . COPD Brother   . Hypertension Brother   . Kidney disease Brother   . Breast cancer Neg Hx     Social History: I have reviewed social  history from my progress note on 02/06/2018. Social History   Socioeconomic History  . Marital status: Divorced    Spouse name: Not on file  . Number of children: 2  . Years of education: Not on file  . Highest education level: Bachelor's degree (e.g., BA, AB, BS)  Occupational History    Comment: part time  Tobacco Use  . Smoking status: Current Every Day Smoker    Packs/day: 0.50    Types: Cigarettes  . Smokeless tobacco: Never Used  Vaping Use  . Vaping Use: Never used  Substance and Sexual Activity  . Alcohol use: Not Currently    Alcohol/week: 1.0 standard drink    Types: 1 Glasses of wine per week  . Drug use: Yes    Types: Marijuana  . Sexual activity: Not Currently  Other Topics Concern  . Not on file  Social History Narrative   Kids    No guns    Wears seat belt    Safe in relationship    Smoker    Lives with Lexicographer    Social Determinants of Health   Financial Resource Strain:   . Difficulty of Paying Living Expenses:   Food Insecurity:   . Worried About Charity fundraiser in the Last Year:   . Arboriculturist in the Last Year:   Transportation Needs:   . Film/video editor (Medical):   Marland Kitchen Lack of Transportation (Non-Medical):   Physical Activity:   . Days of Exercise per Week:   . Minutes of Exercise per Session:   Stress:   . Feeling of Stress :   Social Connections:   . Frequency of Communication with Friends and Family:   . Frequency of Social Gatherings with Friends and Family:   . Attends Religious Services:   . Active Member of Clubs or Organizations:   . Attends Archivist Meetings:   Marland Kitchen Marital Status:     Allergies: No Known Allergies  Metabolic Disorder Labs: No results found for: HGBA1C, MPG No results found for: PROLACTIN Lab Results  Component Value Date   CHOL 119 10/26/2019   TRIG 111 10/26/2019   HDL 47 10/26/2019   CHOLHDL 2.5 10/26/2019   VLDL 23 08/20/2019   LDLCALC 52 10/26/2019   LDLCALC 122  (H) 08/20/2019   Lab Results  Component Value Date   TSH 0.85 08/19/2019   TSH 5.65 (H) 03/10/2018    Therapeutic Level Labs: No results found for: LITHIUM No results found for: VALPROATE No components found for:  CBMZ  Current Medications: Current Outpatient Medications  Medication Sig Dispense Refill  . aspirin 81 MG EC tablet Take 1 tablet (81 mg total) by mouth daily. 90 tablet 3  . atorvastatin (LIPITOR) 20 MG tablet Take 1 tablet (20 mg  total) by mouth daily at 6 PM. 90 tablet 3  . Cholecalciferol (VITAMIN D-3) 5000 units TABS Take by mouth.    . escitalopram (LEXAPRO) 10 MG tablet Take 1 tablet (10 mg total) by mouth daily. 90 tablet 1  . levothyroxine (SYNTHROID) 125 MCG tablet Take 1 tablet (125 mcg total) by mouth daily before breakfast. 30 minutes before breakfast 90 tablet 3  . nitroGLYCERIN (NITROSTAT) 0.4 MG SL tablet Place 1 tablet (0.4 mg total) under the tongue every 5 (five) minutes x 3 doses as needed for chest pain. 30 tablet 1  . Omega-3 1000 MG CAPS Take 1,000 mg by mouth daily.     . ticagrelor (BRILINTA) 90 MG TABS tablet Take 1 tablet (90 mg total) by mouth 2 (two) times daily. 60 tablet 11   No current facility-administered medications for this visit.     Musculoskeletal: Strength & Muscle Tone: UTA Gait & Station: normal Patient leans: N/A  Psychiatric Specialty Exam: Review of Systems  Psychiatric/Behavioral: Positive for sleep disturbance.  All other systems reviewed and are negative.   There were no vitals taken for this visit.There is no height or weight on file to calculate BMI.  General Appearance: Casual  Eye Contact:  Fair  Speech:  Normal Rate  Volume:  Normal  Mood:  Euthymic  Affect:  Congruent  Thought Process:  Goal Directed and Descriptions of Associations: Intact  Orientation:  Full (Time, Place, and Person)  Thought Content: Logical   Suicidal Thoughts:  No  Homicidal Thoughts:  No  Memory:  Immediate;   Fair Recent;    Fair Remote;   Fair  Judgement:  Fair  Insight:  Fair  Psychomotor Activity:  Normal  Concentration:  Concentration: Fair and Attention Span: Fair  Recall:  [employer] of Knowledge: Fair  Language: Fair  Akathisia:  No  Handed:  Right  AIMS (if indicated): UTA  Assets:  Communication Skills Desire for Improvement Housing Social Support  ADL's:  Intact  Cognition: WNL  Sleep:  Restless   Screenings: GAD-7     Video Visit from 08/27/2019 in [facility] Office Visit from 08/19/2019 in [facility] Office Visit from 02/17/2018 in [facility]  Total GAD-7 Score 0 0 9    PHQ2-9     Video Visit from 12/08/2019 in [facility] Video Visit from 08/27/2019 in [facility] Office Visit from 08/19/2019 in [facility] Office Visit from 02/17/2018 in [facility]  PHQ-2 Total Score 0 0 0 4  PHQ-9 Total Score 1 -- -- 11       Assessment and Plan: Maureen Pena is a 64 year old Caucasian female, divorced, employed, lives in [place], has a history of depression, GERD, hypothyroidism was evaluated by telemedicine today.  Patient continues to stay stable on Lexapro.  Patient with psychosocial stressors of her recent cardiac problems however currently is watching her diet, staying positive and is compliant on medications.  Plan as noted below.  Plan MDD in remission PHQ 9 today equals 1 Discussed reducing Lexapro to 5 mg p.o. daily.  She will monitor her symptoms and if she has any recurrence of depressive symptoms discussed with her to go back to Lexapro 10 mg p.o. daily and let writer know. Patient advised to work on her sleep hygiene. CBT as needed  Tobacco use disorder-unstable She reports she is trying to cut back. Provided information for smoking cessation classes.  Follow-up  in clinic in 4 to 6 months or sooner if needed.  I have spent  atleast 20 minutes non face to face with patient today. More than 50 % of the time was spent for preparing to see the patient ( e.g., review of test, records ), obtaining and to review and separately obtained history , ordering medications and test ,psychoeducation and supportive psychotherapy and care coordination,as well as documenting clinical information in electronic health record,interpreting results of test and communication of results This note was generated in part or whole with voice recognition software. Voice recognition is usually quite accurate but there are transcription errors that can and very often do occur. I apologize for any typographical errors that were not detected and corrected.        Ursula Alert, MD 12/08/2019, 2:19 PM

## 2019-12-09 ENCOUNTER — Telehealth: Payer: Self-pay | Admitting: *Deleted

## 2019-12-09 NOTE — Telephone Encounter (Signed)
-----   Message from Minna Merritts, MD sent at 12/07/2019  8:06 AM EDT ----- Liver function tests Appear to have normalized I think she is taking Lipitor 20 daily with Zetia This seems to be working Would recheck lipid panel 2 to 3 months

## 2019-12-09 NOTE — Telephone Encounter (Signed)
Left voicemail message to call back for results and recommendations. 

## 2019-12-10 NOTE — Telephone Encounter (Signed)
Patient calling back for results  Please advise when able

## 2019-12-10 NOTE — Telephone Encounter (Signed)
Call to patient to review labs.    Pt verbalized understanding and has no further questions at this time.    Advised pt to call for any further questions or concerns.  No further orders.  Pt reports that she is not currently taking zetia "because I didn't want to start too many new meds at once".

## 2019-12-24 ENCOUNTER — Encounter: Payer: Self-pay | Admitting: Internal Medicine

## 2019-12-24 ENCOUNTER — Other Ambulatory Visit: Payer: Self-pay

## 2019-12-24 ENCOUNTER — Ambulatory Visit: Payer: 59 | Admitting: Internal Medicine

## 2019-12-24 VITALS — BP 118/76 | HR 82 | Temp 98.4°F | Ht 66.0 in | Wt 161.8 lb

## 2019-12-24 DIAGNOSIS — L918 Other hypertrophic disorders of the skin: Secondary | ICD-10-CM

## 2019-12-24 DIAGNOSIS — Z1231 Encounter for screening mammogram for malignant neoplasm of breast: Secondary | ICD-10-CM

## 2019-12-24 DIAGNOSIS — L821 Other seborrheic keratosis: Secondary | ICD-10-CM

## 2019-12-24 DIAGNOSIS — R748 Abnormal levels of other serum enzymes: Secondary | ICD-10-CM

## 2019-12-24 DIAGNOSIS — E039 Hypothyroidism, unspecified: Secondary | ICD-10-CM

## 2019-12-24 DIAGNOSIS — Z1283 Encounter for screening for malignant neoplasm of skin: Secondary | ICD-10-CM

## 2019-12-24 DIAGNOSIS — I2510 Atherosclerotic heart disease of native coronary artery without angina pectoris: Secondary | ICD-10-CM

## 2019-12-24 NOTE — Progress Notes (Signed)
Patient flagged: Current status:  OVERDUE FOR COLORECTAL CANCER SCREENING Current status:  PATIENT IS OVERDUE FOR BMI FOLLOW UP PLAN BMI is estimated to be 26.1 based on the last recorded weight and height

## 2019-12-24 NOTE — Progress Notes (Signed)
Chief Complaint  Patient presents with  . Follow-up   F/u  1. CAD medical tx on lipitor 20 mg 80 qhs caused elevated lfts and diarrhea but doing better  No chest pain she is still smoking rec quit 2. Hypothyroidism on levo 125 mcg qd  3. Left back irritated lesion, skin tags refer back derm  4. Trying to lose lost 20 lbs on noom and exercise goal 140    Review of Systems  Constitutional: Positive for weight loss.  HENT: Negative for hearing loss.   Eyes: Negative for blurred vision.  Respiratory: Negative for shortness of breath.   Cardiovascular: Negative for chest pain.  Gastrointestinal:       +diarrhea improved  Musculoskeletal: Negative for falls.  Skin: Negative for rash.  Psychiatric/Behavioral: Negative for depression.   Past Medical History:  Diagnosis Date  . Anxiety   . Barrett's esophagus   . Depression   . Diverticulitis   . History of chicken pox   . Thyroid disease    hypothyroidism  . UTI (urinary tract infection)    Past Surgical History:  Procedure Laterality Date  . ESOPHAGOGASTRODUODENOSCOPY (EGD) WITH PROPOFOL N/A 05/02/2018   Procedure: ESOPHAGOGASTRODUODENOSCOPY (EGD) WITH PROPOFOL;  Surgeon: Lucilla Lame, MD;  Location: [place];  Service: Endoscopy;  Laterality: N/A;  . LEFT HEART CATH AND CORONARY ANGIOGRAPHY N/A 08/20/2019   Procedure: LEFT HEART CATH AND CORONARY ANGIOGRAPHY;  Surgeon: Minna Merritts, MD;  Location: [facility];  Service: Cardiovascular;  Laterality: N/A;  . TUBAL LIGATION     Family History  Problem Relation Age of Onset  . Depression Mother   . Anxiety disorder Mother   . Schizophrenia Mother   . Paranoid behavior Mother   . Stroke Mother   . COPD Father   . Early death Father   . Hearing loss Father   . Heart disease Father   . CAD Father        s/p cabg x 3   . Depression Brother   . COPD Brother   . Hypertension Brother   . Kidney disease Brother   . Breast cancer Neg Hx    Social  History   Socioeconomic History  . Marital status: Divorced    Spouse name: Not on file  . Number of children: 2  . Years of education: Not on file  . Highest education level: Bachelor's degree (e.g., BA, AB, BS)  Occupational History    Comment: part time  Tobacco Use  . Smoking status: Current Every Day Smoker    Packs/day: 0.50    Types: Cigarettes  . Smokeless tobacco: Never Used  Vaping Use  . Vaping Use: Never used  Substance and Sexual Activity  . Alcohol use: Not Currently    Alcohol/week: 1.0 standard drink    Types: 1 Glasses of wine per week  . Drug use: Yes    Types: Marijuana  . Sexual activity: Not Currently  Other Topics Concern  . Not on file  Social History Narrative   Kids    No guns    Wears seat belt    Safe in relationship    Smoker    Lives with Lexicographer    Social Determinants of Health   Financial Resource Strain:   . Difficulty of Paying Living Expenses:   Food Insecurity:   . Worried About Charity fundraiser in the Last Year:   . [place] in the  Last Year:   Transportation Needs:   . Film/video editor (Medical):   Marland Kitchen Lack of Transportation (Non-Medical):   Physical Activity:   . Days of Exercise per Week:   . Minutes of Exercise per Session:   Stress:   . Feeling of Stress :   Social Connections:   . Frequency of Communication with Friends and Family:   . Frequency of Social Gatherings with Friends and Family:   . Attends Religious Services:   . Active Member of Clubs or Organizations:   . Attends Archivist Meetings:   Marland Kitchen Marital Status:   Intimate Partner Violence:   . Fear of Current or Ex-Partner:   . Emotionally Abused:   Marland Kitchen Physically Abused:   . Sexually Abused:    Current Meds  Medication Sig  . aspirin 81 MG EC tablet Take 1 tablet (81 mg total) by mouth daily.  Marland Kitchen atorvastatin (LIPITOR) 20 MG tablet Take 1 tablet (20 mg total) by mouth daily at 6 PM.  . Cholecalciferol (VITAMIN D-3) 5000  units TABS Take by mouth.  . escitalopram (LEXAPRO) 10 MG tablet Take 1 tablet (10 mg total) by mouth daily. (Patient taking differently: Take 5 mg by mouth daily. )  . levothyroxine (SYNTHROID) 125 MCG tablet Take 1 tablet (125 mcg total) by mouth daily before breakfast. 30 minutes before breakfast  . nitroGLYCERIN (NITROSTAT) 0.4 MG SL tablet Place 1 tablet (0.4 mg total) under the tongue every 5 (five) minutes x 3 doses as needed for chest pain.  . Omega-3 1000 MG CAPS Take 1,000 mg by mouth daily.   . ticagrelor (BRILINTA) 90 MG TABS tablet Take 1 tablet (90 mg total) by mouth 2 (two) times daily.   No Known Allergies Recent Results (from the past 2160 hour(s))  Lipid panel     Status: None   Collection Time: 10/26/19 10:58 AM  Result Value Ref Range   Cholesterol, Total 119 100 - 199 mg/dL   Triglycerides 111 0 - 149 mg/dL   HDL 47 >39 mg/dL   VLDL Cholesterol Cal 20 5 - 40 mg/dL   LDL Chol Calc (NIH) 52 0 - 99 mg/dL   Chol/HDL Ratio 2.5 0.0 - 4.4 ratio    Comment:                                   T. Chol/HDL Ratio                                             Men  Women                               1/2 Avg.Risk  3.4    3.3                                   Avg.Risk  5.0    4.4                                2X Avg.Risk  9.6    7.1  3X Avg.Risk 23.4   11.0   Hepatic function panel     Status: Abnormal   Collection Time: 10/26/19 10:58 AM  Result Value Ref Range   Total Protein 6.7 6.0 - 8.5 g/dL   Albumin 4.3 3.8 - 4.8 g/dL   Bilirubin Total 0.5 0.0 - 1.2 mg/dL   Bilirubin, Direct 0.14 0.00 - 0.40 mg/dL   Alkaline Phosphatase 164 (H) 48 - 121 IU/L    Comment:               **Please note reference interval change**   AST 67 (H) 0 - 40 IU/L   ALT 135 (H) 0 - 32 IU/L  Hepatic function panel     Status: Abnormal   Collection Time: 12/01/19  2:17 PM  Result Value Ref Range   Total Protein 6.7 6.5 - 8.1 g/dL   Albumin 4.1 3.5 - 5.0 g/dL   AST 31  15 - 41 U/L   ALT 51 (H) 0 - 44 U/L   Alkaline Phosphatase 104 38 - 126 U/L   Total Bilirubin 0.8 0.3 - 1.2 mg/dL   Bilirubin, Direct <0.1 0.0 - 0.2 mg/dL   Indirect Bilirubin NOT CALCULATED 0.3 - 0.9 mg/dL    Comment: Performed at [facility], [address] [place], [place] [address]   Objective  Body mass index is 26.12 kg/m. Wt Readings from Last 3 Encounters:  12/24/19 161 lb 12.8 oz (73.4 kg)  10/26/19 176 lb 6 oz (80 kg)  08/28/19 181 lb (82.1 kg)   Temp Readings from Last 3 Encounters:  12/24/19 98.4 F (36.9 C) (Oral)  08/21/19 98.5 F (36.9 C) (Oral)  08/19/19 97.7 F (36.5 C) (Temporal)   BP Readings from Last 3 Encounters:  12/24/19 118/76  10/26/19 108/72  08/28/19 102/74   Pulse Readings from Last 3 Encounters:  12/24/19 82  10/26/19 76  08/28/19 69    Physical Exam Vitals and nursing note reviewed.  Constitutional:      Appearance: Normal appearance. She is well-developed, well-groomed and overweight.  HENT:     Head: Normocephalic and atraumatic.  Eyes:     Conjunctiva/sclera: Conjunctivae normal.     Pupils: Pupils are equal, round, and reactive to light.  Cardiovascular:     Rate and Rhythm: Normal rate and regular rhythm.     Heart sounds: Normal heart sounds. No murmur heard.   Pulmonary:     Effort: Pulmonary effort is normal.     Breath sounds: Normal breath sounds.  Skin:    General: Skin is warm and dry.  Neurological:     General: No focal deficit present.     Mental Status: She is alert and oriented to person, place, and time. Mental status is at baseline.     Gait: Gait normal.  Psychiatric:        Attention and Perception: Attention and perception normal.        Mood and Affect: Mood and affect normal.        Speech: Speech normal.        Behavior: Behavior normal. Behavior is cooperative.        Thought Content: Thought content normal.        Cognition and Memory: Cognition and memory normal.        Judgment:  Judgment normal.     Assessment  Plan  Hypothyroidism, unspecified type Cont meds  Check labs 02/2020  Skin cancer screening - Plan: Ambulatory referral to Dermatology Cutaneous  skin tags - Plan: Ambulatory referral to Dermatology Seborrheic keratoses ISK left upper back - Plan: Ambulatory referral to Dermatology  Coronary artery disease involving native coronary artery of native heart without angina pectoris  W/o cp  Cont meds f/u with cards Dr. Rockey Situ on asa, brillinta 90, lipitor 20   HM Flu shotutd pna 23 think about  9/27/10Tdap do at f/u 03/07/20  Disc shingrix todaywill wait until after flu shot Declines MMR and hep B check covid 19 2/2    12/10/20mammogramnegative, ordered   Pap at f/u h/o abnormal x 1 -pap11/1/19 neg neg HPV due in 04/11/21  Colonoscopy had 08/27/11 and EGD due repeat h/o barretts;EGD with Dr. Allen Norris 11/22/19no barretts  rec smoking cessation smoking 10-12 cig/qd no FH lung cancer never smoked more than this  Derm sch 11/18 referred Dr. Nicole Kindred   Given cholesterol handout  Provider: Dr. Olivia Mackie McLean-Scocuzza-Internal Medicine

## 2019-12-24 NOTE — Patient Instructions (Addendum)
1800 QUIT NOW Nicotine patches/gum or mints   Debrox ear wax drops     High Cholesterol  High cholesterol is a condition in which the blood has high levels of a white, waxy, fat-like substance (cholesterol). The human body needs small amounts of cholesterol. The liver makes all the cholesterol that the body needs. Extra (excess) cholesterol comes from the food that we eat. Cholesterol is carried from the liver by the blood through the blood vessels. If you have high cholesterol, deposits (plaques) may build up on the walls of your blood vessels (arteries). Plaques make the arteries narrower and stiffer. Cholesterol plaques increase your risk for heart attack and stroke. Work with your health care provider to keep your cholesterol levels in a healthy range. What increases the risk? This condition is more likely to develop in people who:  Eat foods that are high in animal fat (saturated fat) or cholesterol.  Are overweight.  Are not getting enough exercise.  Have a family history of high cholesterol. What are the signs or symptoms? There are no symptoms of this condition. How is this diagnosed? This condition may be diagnosed from the results of a blood test.  If you are older than age 69, your health care provider may check your cholesterol every 4-6 years.  You may be checked more often if you already have high cholesterol or other risk factors for heart disease. The blood test for cholesterol measures:  "Bad" cholesterol (LDL cholesterol). This is the main type of cholesterol that causes heart disease. The desired level for LDL is less than 100.  "Good" cholesterol (HDL cholesterol). This type helps to protect against heart disease by cleaning the arteries and carrying the LDL away. The desired level for HDL is 60 or higher.  Triglycerides. These are fats that the body can store or burn for energy. The desired number for triglycerides is lower than 150.  Total cholesterol. This  is a measure of the total amount of cholesterol in your blood, including LDL cholesterol, HDL cholesterol, and triglycerides. A healthy number is less than 200. How is this treated? This condition is treated with diet changes, lifestyle changes, and medicines. Diet changes  This may include eating more whole grains, fruits, vegetables, nuts, and fish.  This may also include cutting back on red meat and foods that have a lot of added sugar. Lifestyle changes  Changes may include getting at least 40 minutes of aerobic exercise 3 times a week. Aerobic exercises include walking, biking, and swimming. Aerobic exercise along with a healthy diet can help you maintain a healthy weight.  Changes may also include quitting smoking. Medicines  Medicines are usually given if diet and lifestyle changes have failed to reduce your cholesterol to healthy levels.  Your health care provider may prescribe a statin medicine. Statin medicines have been shown to reduce cholesterol, which can reduce the risk of heart disease. Follow these instructions at home: Eating and drinking If told by your health care provider:  Eat chicken (without skin), fish, veal, shellfish, ground [place] breast, and round or loin cuts of red meat.  Do not eat fried foods or fatty meats, such as hot dogs and salami.  Eat plenty of fruits, such as apples.  Eat plenty of vegetables, such as broccoli, potatoes, and carrots.  Eat beans, peas, and lentils.  Eat grains such as barley, rice, couscous, and bulgur wheat.  Eat pasta without cream sauces.  Use skim or nonfat milk, and eat low-fat or  nonfat yogurt and cheeses.  Do not eat or drink whole milk, cream, ice cream, egg yolks, or hard cheeses.  Do not eat stick margarine or tub margarines that contain trans fats (also called partially hydrogenated oils).  Do not eat saturated tropical oils, such as coconut oil and palm oil.  Do not eat cakes, cookies, crackers, or other  baked goods that contain trans fats.  General instructions  Exercise as directed by your health care provider. Increase your activity level with activities such as gardening, walking, and taking the stairs.  Take over-the-counter and prescription medicines only as told by your health care provider.  Do not use any products that contain nicotine or tobacco, such as cigarettes and e-cigarettes. If you need help quitting, ask your health care provider.  Keep all follow-up visits as told by your health care provider. This is important. Contact a health care provider if:  You are struggling to maintain a healthy diet or weight.  You need help to start on an exercise program.  You need help to stop smoking. Get help right away if:  You have chest pain.  You have trouble breathing. This information is not intended to replace advice given to you by your health care provider. Make sure you discuss any questions you have with your health care provider. Document Revised: 05/31/2017 Document Reviewed: 11/26/2015 Elsevier Patient Education  [place].  Cholesterol Content in Foods Cholesterol is a waxy, fat-like substance that helps to carry fat in the blood. The body needs cholesterol in small amounts, but too much cholesterol can cause damage to the arteries and heart. Most people should eat less than 200 milligrams (mg) of cholesterol a day. Foods with cholesterol  Cholesterol is found in animal-based foods, such as meat, seafood, and dairy. Generally, low-fat dairy and lean meats have less cholesterol than full-fat dairy and fatty meats. The milligrams of cholesterol per serving (mg per serving) of common cholesterol-containing foods are listed below. Meat and other proteins  Egg -- one large whole egg has 186 mg.  Veal shank -- 4 oz has 141 mg.  Lean ground [place] (93% lean) -- 4 oz has 118 mg.  Fat-trimmed lamb loin -- 4 oz has 106 mg.  Lean ground beef (90% lean) -- 4 oz  has 100 mg.  Lobster -- 3.5 oz has 90 mg.  Pork loin chops -- 4 oz has 86 mg.  Canned salmon -- 3.5 oz has 83 mg.  Fat-trimmed beef top loin -- 4 oz has 78 mg.  Frankfurter -- 1 frank (3.5 oz) has 77 mg.  Crab -- 3.5 oz has 71 mg.  Roasted chicken without skin, white meat -- 4 oz has 66 mg.  Light bologna -- 2 oz has 45 mg.  Deli-cut [place] -- 2 oz has 31 mg.  Canned tuna -- 3.5 oz has 31 mg.  Berniece Salines -- 1 oz has 29 mg.  Oysters and mussels (raw) -- 3.5 oz has 25 mg.  Mackerel -- 1 oz has 22 mg.  Trout -- 1 oz has 20 mg.  Pork sausage -- 1 link (1 oz) has 17 mg.  Salmon -- 1 oz has 16 mg.  Tilapia -- 1 oz has 14 mg. Dairy  Soft-serve ice cream --  cup (4 oz) has 103 mg.  Whole-milk yogurt -- 1 cup (8 oz) has 29 mg.  Cheddar cheese -- 1 oz has 28 mg.  American cheese -- 1 oz has 28 mg.  Whole milk --  1 cup (8 oz) has 23 mg.  2% milk -- 1 cup (8 oz) has 18 mg.  Cream cheese -- 1 tablespoon (Tbsp) has 15 mg.  Cottage cheese --  cup (4 oz) has 14 mg.  Low-fat (1%) milk -- 1 cup (8 oz) has 10 mg.  Sour cream -- 1 Tbsp has 8.5 mg.  Low-fat yogurt -- 1 cup (8 oz) has 8 mg.  Nonfat Greek yogurt -- 1 cup (8 oz) has 7 mg.  Half-and-half cream -- 1 Tbsp has 5 mg. Fats and oils  Cod liver oil -- 1 tablespoon (Tbsp) has 82 mg.  Butter -- 1 Tbsp has 15 mg.  Lard -- 1 Tbsp has 14 mg.  Bacon grease -- 1 Tbsp has 14 mg.  Mayonnaise -- 1 Tbsp has 5-10 mg.  Margarine -- 1 Tbsp has 3-10 mg. Exact amounts of cholesterol in these foods may vary depending on specific ingredients and brands. Foods without cholesterol Most plant-based foods do not have cholesterol unless you combine them with a food that has cholesterol. Foods without cholesterol include:  Grains and cereals.  Vegetables.  Fruits.  Vegetable oils, such as olive, canola, and sunflower oil.  Legumes, such as peas, beans, and lentils.  Nuts and seeds.  Egg whites. Summary  The body  needs cholesterol in small amounts, but too much cholesterol can cause damage to the arteries and heart.  Most people should eat less than 200 milligrams (mg) of cholesterol a day. This information is not intended to replace advice given to you by your health care provider. Make sure you discuss any questions you have with your health care provider. Document Revised: 05/10/2017 Document Reviewed: 01/22/2017 Elsevier Patient Education  [place].  Seborrheic Keratosis A seborrheic keratosis is a common, noncancerous (benign) skin growth. These growths are velvety, waxy, rough, tan, brown, or black spots that appear on the skin. These skin growths can be flat or raised, and scaly. What are the causes? The cause of this condition is not known. What increases the risk? You are more likely to develop this condition if you:  Have a family history of seborrheic keratosis.  Are 50 or older.  Are pregnant.  Have had estrogen replacement therapy. What are the signs or symptoms? Symptoms of this condition include growths on the face, chest, shoulders, back, or other areas. These growths:  Are usually painless, but may become irritated and itchy.  Can be yellow, brown, black, or other colors.  Are slightly raised or have a flat surface.  Are sometimes rough or wart-like in texture.  Are often velvety or waxy on the surface.  Are round or oval-shaped.  Often occur in groups, but may occur as a single growth. How is this diagnosed? This condition is diagnosed with a medical history and physical exam.  A sample of the growth may be tested (skin biopsy).  You may need to see a skin specialist (dermatologist). How is this treated? Treatment is not usually needed for this condition, unless the growths are irritated or bleed often.  You may also choose to have the growths removed if you do not like their appearance. ? Most commonly, these growths are treated with a procedure in  which liquid nitrogen is applied to "freeze" off the growth (cryosurgery). ? They may also be burned off with electricity (electrocautery) or removed by scraping (curettage). Follow these instructions at home:  Watch your growth for any changes.  Keep all follow-up visits as told by  your health care provider. This is important.  Do not scratch or pick at the growth or growths. This can cause them to become irritated or infected. Contact a health care provider if:  You suddenly have many new growths.  Your growth bleeds, itches, or hurts.  Your growth suddenly becomes larger or changes color. Summary  A seborrheic keratosis is a common, noncancerous (benign) skin growth.  Treatment is not usually needed for this condition, unless the growths are irritated or bleed often.  Watch your growth for any changes.  Contact a health care provider if you suddenly have many new growths or your growth suddenly becomes larger or changes color.  Keep all follow-up visits as told by your health care provider. This is important. This information is not intended to replace advice given to you by your health care provider. Make sure you discuss any questions you have with your health care provider. Document Revised: 10/10/2017 Document Reviewed: 10/10/2017 Elsevier Patient Education  Maureen Pena.

## 2020-02-22 ENCOUNTER — Telehealth: Payer: Self-pay | Admitting: Internal Medicine

## 2020-02-22 NOTE — Telephone Encounter (Signed)
Pt states that she has had diarrhea for four days following something that she ate. She just wants to know what she can take. There are no appts avail today or tomorrow. Please advise.

## 2020-02-22 NOTE — Telephone Encounter (Signed)
Please advise 

## 2020-02-23 NOTE — Telephone Encounter (Signed)
BRAT diet  Bananas, rice, broth, applesauce toast/crackers ginger ale  Pepobismol otc  Low sugar gatorade   If not better please sch appt with me or another provider this week or [facility] or [place]  Confirm no covid sx's? Or no exposure to covid as well which can have diarrhea?    Thanks [employer]

## 2020-02-23 NOTE — Telephone Encounter (Signed)
Patient informed and verbalized understanding.  States she has been doing this for the last four days. States she does feel better today. She ate pizza and wings last Thursday. She then had diarrhea Friday, Saturday, Sunday, and Monday.   Patient states that if her symptoms start to worsen again she will call to schedule. Patient wants to thank Dr Olivia Mackie McLean-Scocuzza and also says Hi.

## 2020-02-29 ENCOUNTER — Other Ambulatory Visit: Payer: Self-pay | Admitting: Psychiatry

## 2020-02-29 ENCOUNTER — Encounter: Payer: Self-pay | Admitting: Internal Medicine

## 2020-02-29 DIAGNOSIS — F3342 Major depressive disorder, recurrent, in full remission: Secondary | ICD-10-CM

## 2020-03-08 ENCOUNTER — Other Ambulatory Visit: Payer: Self-pay

## 2020-03-08 ENCOUNTER — Ambulatory Visit (INDEPENDENT_AMBULATORY_CARE_PROVIDER_SITE_OTHER): Payer: 59

## 2020-03-08 DIAGNOSIS — I2510 Atherosclerotic heart disease of native coronary artery without angina pectoris: Secondary | ICD-10-CM

## 2020-03-08 DIAGNOSIS — R748 Abnormal levels of other serum enzymes: Secondary | ICD-10-CM

## 2020-03-08 DIAGNOSIS — Z23 Encounter for immunization: Secondary | ICD-10-CM

## 2020-03-08 DIAGNOSIS — E039 Hypothyroidism, unspecified: Secondary | ICD-10-CM | POA: Diagnosis not present

## 2020-03-08 LAB — CBC WITH DIFFERENTIAL/PLATELET
Basophils Absolute: 0 10*3/uL (ref 0.0–0.1)
Basophils Relative: 0.6 % (ref 0.0–3.0)
Eosinophils Absolute: 0.2 10*3/uL (ref 0.0–0.7)
Eosinophils Relative: 3.3 % (ref 0.0–5.0)
HCT: 42.4 % (ref 36.0–46.0)
Hemoglobin: 14.2 g/dL (ref 12.0–15.0)
Lymphocytes Relative: 19.6 % (ref 12.0–46.0)
Lymphs Abs: 1.3 10*3/uL (ref 0.7–4.0)
MCHC: 33.4 g/dL (ref 30.0–36.0)
MCV: 91.2 fl (ref 78.0–100.0)
Monocytes Absolute: 0.5 10*3/uL (ref 0.1–1.0)
Monocytes Relative: 7.5 % (ref 3.0–12.0)
Neutro Abs: 4.5 10*3/uL (ref 1.4–7.7)
Neutrophils Relative %: 69 % (ref 43.0–77.0)
Platelets: 204 10*3/uL (ref 150.0–400.0)
RBC: 4.65 Mil/uL (ref 3.87–5.11)
RDW: 14.6 % (ref 11.5–15.5)
WBC: 6.5 10*3/uL (ref 4.0–10.5)

## 2020-03-08 LAB — COMPREHENSIVE METABOLIC PANEL
ALT: 41 U/L — ABNORMAL HIGH (ref 0–35)
AST: 29 U/L (ref 0–37)
Albumin: 4 g/dL (ref 3.5–5.2)
Alkaline Phosphatase: 100 U/L (ref 39–117)
BUN: 15 mg/dL (ref 6–23)
CO2: 24 mEq/L (ref 19–32)
Calcium: 9.6 mg/dL (ref 8.4–10.5)
Chloride: 106 mEq/L (ref 96–112)
Creatinine, Ser: 0.81 mg/dL (ref 0.40–1.20)
GFR: 71.47 mL/min (ref >60.00)
Glucose, Bld: 91 mg/dL (ref 70–99)
Potassium: 4.2 mEq/L (ref 3.5–5.1)
Sodium: 139 mEq/L (ref 135–145)
Total Bilirubin: 0.7 mg/dL (ref 0.2–1.2)
Total Protein: 6 g/dL (ref 6.0–8.3)

## 2020-03-08 LAB — LIPID PANEL
Cholesterol: 165 mg/dL (ref 0–200)
HDL: 54.6 mg/dL (ref >39.00)
LDL Cholesterol: 94 mg/dL (ref 0–99)
NonHDL: 110.13
Total CHOL/HDL Ratio: 3
Triglycerides: 82 mg/dL (ref 0.0–149.0)
VLDL: 16.4 mg/dL (ref 0.0–40.0)

## 2020-03-08 LAB — TSH: TSH: 0.02 u[IU]/mL — ABNORMAL LOW (ref 0.35–4.50)

## 2020-03-08 NOTE — Progress Notes (Signed)
Patient presented for TDAP injection to left deltoid, patient voiced no concerns nor showed any signs of distress during injection. 

## 2020-03-08 NOTE — Addendum Note (Signed)
Addended by: Tor Netters I on: 03/08/2020 08:34 AM   Modules accepted: Orders

## 2020-03-14 ENCOUNTER — Other Ambulatory Visit: Payer: Self-pay | Admitting: Internal Medicine

## 2020-03-14 DIAGNOSIS — E039 Hypothyroidism, unspecified: Secondary | ICD-10-CM

## 2020-03-14 DIAGNOSIS — R748 Abnormal levels of other serum enzymes: Secondary | ICD-10-CM

## 2020-03-28 ENCOUNTER — Ambulatory Visit (INDEPENDENT_AMBULATORY_CARE_PROVIDER_SITE_OTHER): Payer: 59 | Admitting: Dermatology

## 2020-03-28 ENCOUNTER — Other Ambulatory Visit: Payer: Self-pay

## 2020-03-28 DIAGNOSIS — D229 Melanocytic nevi, unspecified: Secondary | ICD-10-CM | POA: Diagnosis not present

## 2020-03-28 DIAGNOSIS — L814 Other melanin hyperpigmentation: Secondary | ICD-10-CM

## 2020-03-28 DIAGNOSIS — L578 Other skin changes due to chronic exposure to nonionizing radiation: Secondary | ICD-10-CM

## 2020-03-28 DIAGNOSIS — L821 Other seborrheic keratosis: Secondary | ICD-10-CM

## 2020-03-28 DIAGNOSIS — Z1283 Encounter for screening for malignant neoplasm of skin: Secondary | ICD-10-CM

## 2020-03-28 DIAGNOSIS — D1800 Hemangioma unspecified site: Secondary | ICD-10-CM

## 2020-03-28 DIAGNOSIS — L820 Inflamed seborrheic keratosis: Secondary | ICD-10-CM | POA: Diagnosis not present

## 2020-03-28 DIAGNOSIS — I8310 Varicose veins of unspecified lower extremity with inflammation: Secondary | ICD-10-CM

## 2020-03-28 NOTE — Progress Notes (Signed)
   Follow-Up Visit   Subjective  Maureen Pena is a 64 y.o. female who presents for the following: Annual Exam (TBSE, no history of skin cancer) and Skin Problem (Pt c/o irritated spots on the flanks growing and itching ). The patient presents for Total-Body Skin Exam (TBSE) for skin cancer screening and mole check.  The following portions of the chart were reviewed this encounter and updated as appropriate:  Tobacco  Allergies  Meds  Problems  Med Hx  Surg Hx  Fam Hx     Review of Systems:  No other skin or systemic complaints except as noted in HPI or Assessment and Plan.  Objective  Well appearing patient in no apparent distress; mood and affect are within normal limits.  A full examination was performed including scalp, head, eyes, ears, nose, lips, neck, chest, axillae, abdomen, back, buttocks, bilateral upper extremities, bilateral lower extremities, hands, feet, fingers, toes, fingernails, and toenails. All findings within normal limits unless otherwise noted below.  Objective  flank (7): Erythematous keratotic or waxy stuck-on papule or plaque.    Assessment & Plan  Inflamed seborrheic keratosis (7) flank  Destruction of lesion - flank Complexity: simple   Destruction method: cryotherapy   Informed consent: discussed and consent obtained   Timeout:  patient name, date of birth, surgical site, and procedure verified Lesion destroyed using liquid nitrogen: Yes   Region frozen until ice ball extended beyond lesion: Yes   Outcome: patient tolerated procedure well with no complications   Post-procedure details: wound care instructions given    Skin cancer screening   Lentigines - Scattered tan macules - Discussed due to sun exposure - Benign, observe - Call for any changes  Seborrheic Keratoses - Stuck-on, waxy, tan-brown papules and plaques  - Discussed benign etiology and prognosis. - Observe - Call for any changes  Melanocytic Nevi - Tan-brown and/or  pink-flesh-colored symmetric macules and papules - Benign appearing on exam today - Observation - Call clinic for new or changing moles - Recommend daily use of broad spectrum spf 30+ sunscreen to sun-exposed areas.   Hemangiomas - Red papules - Discussed benign nature - Observe - Call for any changes  Actinic Damage - diffuse scaly erythematous macules with underlying dyspigmentation - Recommend daily broad spectrum sunscreen SPF 30+ to sun-exposed areas, reapply every 2 hours as needed.  - Call for new or changing lesions.  Skin cancer screening performed today.  Varicose Veins - Dilated blue, purple or red veins at the lower extremities - Reassured - These can be treated by sclerotherapy (a procedure to inject a medicine into the veins to make them disappear) if desired, but the treatment is not covered by insurance  Return in about 1 year (around 03/28/2021).  IMarye Round, CMA, am acting as scribe for Sarina Ser, MD .  Documentation: I have reviewed the above documentation for accuracy and completeness, and I agree with the above.  Sarina Ser, MD

## 2020-03-28 NOTE — Patient Instructions (Signed)
Cryotherapy Aftercare  . Wash gently with soap and water everyday.   . Apply Vaseline and Band-Aid daily until healed.  

## 2020-03-29 ENCOUNTER — Encounter: Payer: Self-pay | Admitting: Dermatology

## 2020-04-25 NOTE — Progress Notes (Signed)
Cardiology Office Note  Date:  04/27/2020   ID:  Maureen Pena, DOB 1956-05-27, MRN [id number]  PCP:  McLean-Scocuzza, Nino Glow, MD   Chief Complaint  Patient presents with   Follow-up    6 month. Meds reviewed by the pt. verbally. "doing well."     HPI:  64 year old woman with history of  smoking,  hyperlipidemia,  strong family history of coronary disease  Late presenting STEMI, initial event August 16, 2019 Severe single-vessel disease proximal to mid left circumflex after OM1 takeoff Collaterals noted  medical management was recommended Who presents for follow up of her CAD  Last seen in clinic May 2021 At that time was still smoking, previously tried Chantix Had not tried other modalities Was active  Recent lab work showing hyperthyroidism Secondary to climbing LFTs, Lipitor dose decreased from 80 down to 20 Improvement in liver function down close to normal range  Lab work from primary care, cholesterol above goal, 165 with LDL 94 Up from number 6 months earlier at which time total cholesterol 119 LDL 52 on lower dose Lipitor  Over the past 6 months she has had significant weight loss Eating very healthy Down 40 pounds,   EKG personally reviewed by myself on todays visit NSr rate 77 bpm T wave abnormality V5 V6 1 and aVL  Other past medical history reviewed chest pain August 16, 2019 developed 15 minutes of chest pain radiating through to her back between her scapulas.   did not seek out assistance, Seen by primary care March 10, lab work performed showing elevated D-dimer, elevated troponin  presented to the emergency room, initial EKG concerning for late presenting inferior wall STEMI.     PMH:   has a past medical history of Anxiety, Barrett's esophagus, Depression, Diverticulitis, History of chicken pox, Thyroid disease, and UTI (urinary tract infection).  PSH:    Past Surgical History:  Procedure Laterality Date   ESOPHAGOGASTRODUODENOSCOPY (EGD)  WITH PROPOFOL N/A 05/02/2018   Procedure: ESOPHAGOGASTRODUODENOSCOPY (EGD) WITH PROPOFOL;  Surgeon: Lucilla Lame, MD;  Location: [facility];  Service: Endoscopy;  Laterality: N/A;   LEFT HEART CATH AND CORONARY ANGIOGRAPHY N/A 08/20/2019   Procedure: LEFT HEART CATH AND CORONARY ANGIOGRAPHY;  Surgeon: Minna Merritts, MD;  Location: [facility];  Service: Cardiovascular;  Laterality: N/A;   TUBAL LIGATION      Current Outpatient Medications  Medication Sig Dispense Refill   aspirin 81 MG EC tablet Take 1 tablet (81 mg total) by mouth daily. 90 tablet 3   atorvastatin (LIPITOR) 10 MG tablet Take 1 tablet (10 mg total) by mouth daily at 6 PM. 90 tablet 3   Cholecalciferol (VITAMIN D-3) 5000 units TABS Take by mouth.     escitalopram (LEXAPRO) 10 MG tablet TAKE 1 TABLET BY MOUTH EVERY DAY (Patient taking differently: Take 5 mg by mouth daily. ) 90 tablet 1   levothyroxine (SYNTHROID) 125 MCG tablet Take 1 tablet (125 mcg total) by mouth daily before breakfast. 30 minutes before breakfast 90 tablet 3   nitroGLYCERIN (NITROSTAT) 0.4 MG SL tablet Place 1 tablet (0.4 mg total) under the tongue every 5 (five) minutes x 3 doses as needed for chest pain. 30 tablet 1   Omega-3 1000 MG CAPS Take 1,000 mg by mouth daily.      ticagrelor (BRILINTA) 90 MG TABS tablet Take 1 tablet (90 mg total) by mouth 2 (two) times daily. 60 tablet 11   ezetimibe (ZETIA) 10 MG tablet Take 1 tablet (  10 mg total) by mouth daily. 90 tablet 3   No current facility-administered medications for this visit.     Allergies:   Patient has no known allergies.   Social History:  The patient  reports that she has been smoking cigarettes. She has been smoking about 0.50 packs per day. She has never used smokeless tobacco. She reports previous alcohol use of about 1.0 standard drink of alcohol per week. She reports current drug use. Drug: Marijuana.   Family History:   family history includes Anxiety  disorder in her mother; CAD in her father; COPD in her brother and father; Depression in her brother and mother; Early death in her father; Hearing loss in her father; Heart disease in her father; Hypertension in her brother; Kidney disease in her brother; Paranoid behavior in her mother; Schizophrenia in her mother; Stroke in her mother.    Review of Systems: Review of Systems  Constitutional: Negative.   HENT: Negative.   Respiratory: Negative.   Cardiovascular: Negative.   Gastrointestinal: Negative.   Musculoskeletal: Negative.   Neurological: Negative.   Psychiatric/Behavioral: Negative.   All other systems reviewed and are negative.   PHYSICAL EXAM: VS:  BP 100/66 (BP Location: Left Arm, Patient Position: Sitting, Cuff Size: Normal)    Pulse 77    Ht 5' 6.5" (1.689 m)    Wt 141 lb 6 oz (64.1 kg)    SpO2 99%    BMI 22.48 kg/m  , BMI Body mass index is 22.48 kg/m. GEN: Well nourished, well developed, in no acute distress HEENT: normal Neck: no JVD, carotid bruits, or masses Cardiac: RRR; no murmurs, rubs, or gallops,no edema  Respiratory:  clear to auscultation bilaterally, normal work of breathing GI: soft, nontender, nondistended, + BS MS: no deformity or atrophy Skin: warm and dry, no rash Neuro:  Strength and sensation are intact Psych: euthymic mood, full affect    Recent Labs: 03/08/2020: BUN 15; Creatinine, Ser 0.81; Hemoglobin 14.2; Platelets 204.0; Potassium 4.2; Sodium 139 04/26/2020: ALT 53; TSH 0.06    Lipid Panel Lab Results  Component Value Date   CHOL 165 03/08/2020   HDL 54.60 03/08/2020   LDLCALC 94 03/08/2020   TRIG 82.0 03/08/2020      Wt Readings from Last 3 Encounters:  04/27/20 141 lb 6 oz (64.1 kg)  12/24/19 161 lb 12.8 oz (73.4 kg)  10/26/19 176 lb 6 oz (80 kg)       ASSESSMENT AND PLAN:  Problem List Items Addressed This Visit      Cardiology Problems   Coronary artery disease involving native coronary artery of native heart  without angina pectoris - Primary   Relevant Medications   ezetimibe (ZETIA) 10 MG tablet   atorvastatin (LIPITOR) 10 MG tablet    Other Visit Diagnoses    Hyperlipidemia LDL goal <70       Relevant Medications   ezetimibe (ZETIA) 10 MG tablet   atorvastatin (LIPITOR) 10 MG tablet   Nonrheumatic mitral valve regurgitation       Relevant Medications   ezetimibe (ZETIA) 10 MG tablet   atorvastatin (LIPITOR) 10 MG tablet   Smoker         Coronary disease with stable angina Did not tolerate Plavix in the past was changed to Brilinta Will continue aspirin Brilinta for now, can discontinue Brilinta or decrease dose after 1 year post event -Stressed importance of aggressive lipid management as detailed below  Smoker Smoking cessation recommended Still smoking as of today's  visit  Hyperlipidemia Minimally elevated LFTs though dramatic improvement with Lipitor from 80 down to 20 -Likely not a significant issue but we will decrease Lipitor down to 10 daily and add Zetia Goal LDL less than 70  Hypothyroid Will defer to primary care, she would like to go back on lower dose 112 mcg Does not feel that she can skip a dose here and there Does not have a pillbox  Transaminitis Suspect secondary to statin Weight is down 40 pounds, less likely fatty liver Will decrease Lipitor down to 10 May need to change to different statin, this was discussed with her.  She prefers to stay on Lipitor for now as she is used to it but we will decrease dose     Total encounter time more than 25 minutes  Greater than 50% was spent in counseling and coordination of care with the patient    Signed, Esmond Plants, M.D., Ph.D. [place], [place]

## 2020-04-26 ENCOUNTER — Other Ambulatory Visit: Payer: Self-pay

## 2020-04-26 ENCOUNTER — Encounter: Payer: Self-pay | Admitting: Internal Medicine

## 2020-04-26 ENCOUNTER — Other Ambulatory Visit (INDEPENDENT_AMBULATORY_CARE_PROVIDER_SITE_OTHER): Payer: 59

## 2020-04-26 ENCOUNTER — Other Ambulatory Visit: Payer: Self-pay | Admitting: Internal Medicine

## 2020-04-26 DIAGNOSIS — E039 Hypothyroidism, unspecified: Secondary | ICD-10-CM

## 2020-04-26 DIAGNOSIS — E559 Vitamin D deficiency, unspecified: Secondary | ICD-10-CM | POA: Diagnosis not present

## 2020-04-26 DIAGNOSIS — R748 Abnormal levels of other serum enzymes: Secondary | ICD-10-CM

## 2020-04-26 DIAGNOSIS — E785 Hyperlipidemia, unspecified: Secondary | ICD-10-CM

## 2020-04-26 DIAGNOSIS — Z1389 Encounter for screening for other disorder: Secondary | ICD-10-CM

## 2020-04-26 LAB — HEPATIC FUNCTION PANEL
ALT: 53 U/L — ABNORMAL HIGH (ref 0–35)
AST: 38 U/L — ABNORMAL HIGH (ref 0–37)
Albumin: 4.2 g/dL (ref 3.5–5.2)
Alkaline Phosphatase: 116 U/L (ref 39–117)
Bilirubin, Direct: 0.1 mg/dL (ref 0.0–0.3)
Total Bilirubin: 0.5 mg/dL (ref 0.2–1.2)
Total Protein: 6.4 g/dL (ref 6.0–8.3)

## 2020-04-26 LAB — VITAMIN D 25 HYDROXY (VIT D DEFICIENCY, FRACTURES): VITD: 61.11 ng/mL (ref 30.00–100.00)

## 2020-04-26 LAB — TSH: TSH: 0.06 u[IU]/mL — ABNORMAL LOW (ref 0.35–4.50)

## 2020-04-27 ENCOUNTER — Encounter: Payer: Self-pay | Admitting: Cardiovascular Disease

## 2020-04-27 ENCOUNTER — Ambulatory Visit (INDEPENDENT_AMBULATORY_CARE_PROVIDER_SITE_OTHER): Payer: 59 | Admitting: Cardiovascular Disease

## 2020-04-27 VITALS — BP 100/66 | HR 77 | Ht 66.5 in | Wt 141.4 lb

## 2020-04-27 DIAGNOSIS — F17200 Nicotine dependence, unspecified, uncomplicated: Secondary | ICD-10-CM | POA: Diagnosis not present

## 2020-04-27 DIAGNOSIS — I340 Nonrheumatic mitral (valve) insufficiency: Secondary | ICD-10-CM

## 2020-04-27 DIAGNOSIS — I25118 Atherosclerotic heart disease of native coronary artery with other forms of angina pectoris: Secondary | ICD-10-CM | POA: Diagnosis not present

## 2020-04-27 DIAGNOSIS — E785 Hyperlipidemia, unspecified: Secondary | ICD-10-CM | POA: Diagnosis not present

## 2020-04-27 LAB — URINALYSIS, ROUTINE W REFLEX MICROSCOPIC
Bilirubin Urine: NEGATIVE
Glucose, UA: NEGATIVE
Hgb urine dipstick: NEGATIVE
Ketones, ur: NEGATIVE
Leukocytes,Ua: NEGATIVE
Nitrite: NEGATIVE
Protein, ur: NEGATIVE
Specific Gravity, Urine: 1.01 (ref 1.001–1.03)
pH: 6 (ref 5.0–8.0)

## 2020-04-27 MED ORDER — EZETIMIBE 10 MG PO TABS: 10.0000 mg | ORAL_TABLET | Freq: Every day | ORAL | 3 refills | Status: DC

## 2020-04-27 MED ORDER — ATORVASTATIN CALCIUM 10 MG PO TABS: 10.0000 mg | ORAL_TABLET | Freq: Every day | ORAL | 3 refills | Status: DC

## 2020-04-27 NOTE — Telephone Encounter (Signed)
-----   Message from Delorise Jackson, MD sent at 04/26/2020  5:51 PM EST ----- Liver enzymes elevated  -is she agreeable to US abdomen to work this up ? To see if has fatty liver  Vitamin D normal   TSH thyroid lab low is she taking 125 mcg thyroid med daily and skipping sundays?  -we may need to go back down to 112 mcg dose M-Sat and 1/2 of 112 dose on Sundays is she agreeable  -then will need to repeat TSH in 6-8 weeks

## 2020-04-27 NOTE — Patient Instructions (Signed)
Medication Instructions:  Change lipitior to 10 mg daily Start zetia 10 mg daily  If you need a refill on your cardiac medications before your next appointment, please call your pharmacy.    Lab work: No new labs needed   If you have labs (blood work) drawn today and your tests are completely normal, you will receive your results only by: Marland Kitchen MyChart Message (if you have MyChart) OR . A paper copy in the mail If you have any lab test that is abnormal or we need to change your treatment, we will call you to review the results.   Testing/Procedures: No new testing needed   Follow-Up: At [facility], you and your health needs are our priority.  As part of our continuing mission to provide you with exceptional heart care, we have created designated Provider Care Teams.  These Care Teams include your primary Cardiologist (physician) and Advanced Practice Providers (APPs -  Physician Assistants and Nurse Practitioners) who all work together to provide you with the care you need, when you need it.  . You will need a follow up appointment in 12 months  . Providers on your designated Care Team:   . Murray Hodgkins, NP . Christell Faith, PA-C . Marrianne Mood, PA-C  Any Other Special Instructions Will Be Listed Below (If Applicable).  COVID-19 Vaccine Information can be found at: [email or web address] For questions related to vaccine distribution or appointments, please email [email or web address] or call [phone].

## 2020-05-02 ENCOUNTER — Other Ambulatory Visit: Payer: Self-pay | Admitting: Internal Medicine

## 2020-05-02 DIAGNOSIS — E039 Hypothyroidism, unspecified: Secondary | ICD-10-CM

## 2020-05-02 MED ORDER — LEVOTHYROXINE SODIUM 112 MCG PO TABS: 112.0000 ug | ORAL_TABLET | Freq: Every day | ORAL | 3 refills | Status: DC

## 2020-05-03 ENCOUNTER — Other Ambulatory Visit: Payer: Self-pay

## 2020-05-03 DIAGNOSIS — E039 Hypothyroidism, unspecified: Secondary | ICD-10-CM

## 2020-05-03 MED ORDER — LEVOTHYROXINE SODIUM 112 MCG PO TABS: 112.0000 ug | ORAL_TABLET | Freq: Every day | ORAL | 3 refills | Status: DC

## 2020-05-23 ENCOUNTER — Other Ambulatory Visit: Payer: Self-pay

## 2020-05-23 ENCOUNTER — Ambulatory Visit
Admission: RE | Admit: 2020-05-23 | Discharge: 2020-05-23 | Disposition: A | Discharge status: Home / Self Care | Payer: 59 | Source: Ambulatory Visit | Attending: Internal Medicine | Admitting: Internal Medicine

## 2020-05-23 DIAGNOSIS — Z1231 Encounter for screening mammogram for malignant neoplasm of breast: Secondary | ICD-10-CM | POA: Diagnosis not present

## 2020-05-25 ENCOUNTER — Other Ambulatory Visit: Payer: Self-pay | Admitting: Internal Medicine

## 2020-05-25 DIAGNOSIS — R928 Other abnormal and inconclusive findings on diagnostic imaging of breast: Secondary | ICD-10-CM

## 2020-05-25 DIAGNOSIS — N6310 Unspecified lump in the right breast, unspecified quadrant: Secondary | ICD-10-CM

## 2020-05-26 ENCOUNTER — Telehealth: Payer: Self-pay

## 2020-05-26 NOTE — Telephone Encounter (Signed)
LM on VM that orders have been entered and she can now call to schedule

## 2020-05-26 NOTE — Telephone Encounter (Signed)
Order Is in she can call to schedule

## 2020-05-27 ENCOUNTER — Other Ambulatory Visit: Payer: Self-pay

## 2020-05-27 ENCOUNTER — Telehealth (INDEPENDENT_AMBULATORY_CARE_PROVIDER_SITE_OTHER): Payer: 59 | Admitting: Psychiatry

## 2020-05-27 ENCOUNTER — Encounter: Payer: Self-pay | Admitting: Psychiatry

## 2020-05-27 DIAGNOSIS — F17200 Nicotine dependence, unspecified, uncomplicated: Secondary | ICD-10-CM | POA: Diagnosis not present

## 2020-05-27 DIAGNOSIS — F3342 Major depressive disorder, recurrent, in full remission: Secondary | ICD-10-CM | POA: Diagnosis not present

## 2020-05-27 MED ORDER — ESCITALOPRAM OXALATE 5 MG PO TABS
5.0000 mg | ORAL_TABLET | Freq: Every day | ORAL | 1 refills | Status: DC
Start: 2020-05-27 — End: 2020-11-17

## 2020-05-27 NOTE — Progress Notes (Signed)
Virtual Visit via Video Note  I connected with Maureen Pena on 05/27/20 at 11:00 AM EST by a video enabled telemedicine application and verified that I am speaking with the correct person using two identifiers.  Location Provider Location : [facility] Patient Location : Home  Participants: Patient , Provider   I discussed the limitations of evaluation and management by telemedicine and the availability of in person appointments. The patient expressed understanding and agreed to proceed.   I discussed the assessment and treatment plan with the patient. The patient was provided an opportunity to ask questions and all were answered. The patient agreed with the plan and demonstrated an understanding of the instructions.   The patient was advised to call back or seek an in-person evaluation if the symptoms worsen or if the condition fails to improve as anticipated.  [facility] MD OP Progress Note  05/27/2020 12:51 PM Maureen Pena  MRN:  [id number]  Chief Complaint:  Chief Complaint    Follow-up     HPI: Maureen Pena is a 64 year old Caucasian female, employed, divorced, lives in [place], has a history of depression, tobacco use disorder, hypothyroidism was evaluated by telemedicine today.  Patient today reports she has been busy the past few weeks.  She reports she adopted a pregnant dog and ended up with 5 puppies.  She reports she was busy taking care of the puppies the past few weeks.  She reports the last puppy will be given away by Monday and she looks forward to that.  She however reports she really enjoyed the experience of delivering and taking care of these puppies.  She reports she recently had a mammogram done and likely may need further testing to determine the cause of right breast mass like lesion.  She reports that does worry her a little bit.  She however has been coping okay.  She was recently found to have elevated liver function test, she reports her  atorvastatin dosage was reduced and she was started on Zetia.  Patient overall reports mood wise she is doing okay.  She continues to take Lexapro.  She is not ready to come off of the Lexapro yet given the situational stressors.  She reports she continues to smoke cigarettes and has not been able to cut back much.  Patient denies any suicidality, homicidality or perceptual disturbances.  Patient reports she has successfully lost at least 40 pounds since the past 7 months.  She reports she has been dieting and exercising and has reached her BMI goal .  Visit Diagnosis:    ICD-10-CM   1. MDD (major depressive disorder), recurrent, in full remission ([place])  F33.42 escitalopram (LEXAPRO) 5 MG tablet  2. Tobacco use disorder  F17.200     Past Psychiatric History: I have reviewed past psychiatric history from my progress note on 02/06/2018.  Past trials of Lexapro, hydroxyzine  Past Medical History:  Past Medical History:  Diagnosis Date  . Anxiety   . Barrett's esophagus   . Depression   . Diverticulitis   . History of chicken pox   . Thyroid disease    hypothyroidism  . UTI (urinary tract infection)     Past Surgical History:  Procedure Laterality Date  . ESOPHAGOGASTRODUODENOSCOPY (EGD) WITH PROPOFOL N/A 05/02/2018   Procedure: ESOPHAGOGASTRODUODENOSCOPY (EGD) WITH PROPOFOL;  Surgeon: Lucilla Lame, MD;  Location: [place];  Service: Endoscopy;  Laterality: N/A;  . LEFT HEART CATH AND CORONARY ANGIOGRAPHY N/A 08/20/2019   Procedure: LEFT HEART CATH AND CORONARY  ANGIOGRAPHY;  Surgeon: Minna Merritts, MD;  Location: [place] [facility];  Service: Cardiovascular;  Laterality: N/A;  . TUBAL LIGATION      Family Psychiatric History: I have reviewed family psychiatric history from my progress note on 02/06/2018  Family History:  Family History  Problem Relation Age of Onset  . Depression Mother   . Anxiety disorder Mother   . Schizophrenia Mother   . Paranoid behavior  Mother   . Stroke Mother   . COPD Father   . Early death Father   . Hearing loss Father   . Heart disease Father   . CAD Father        s/p cabg x 3   . Depression Brother   . COPD Brother   . Hypertension Brother   . Kidney disease Brother   . Breast cancer Neg Hx     Social History: Reviewed social history from my progress note on 02/06/2018 Social History   Socioeconomic History  . Marital status: Divorced    Spouse name: Not on file  . Number of children: 2  . Years of education: Not on file  . Highest education level: Bachelor's degree (e.g., BA, AB, BS)  Occupational History    Comment: part time  Tobacco Use  . Smoking status: Current Every Day Smoker    Packs/day: 0.50    Types: Cigarettes  . Smokeless tobacco: Never Used  Vaping Use  . Vaping Use: Never used  Substance and Sexual Activity  . Alcohol use: Not Currently    Alcohol/week: 1.0 standard drink    Types: 1 Glasses of wine per week  . Drug use: Yes    Types: Marijuana  . Sexual activity: Not Currently  Other Topics Concern  . Not on file  Social History Narrative   Kids    No guns    Wears seat belt    Safe in relationship    Smoker    Lives with Lexicographer    Social Determinants of Health   Financial Resource Strain: Not on file  Food Insecurity: Not on file  Transportation Needs: Not on file  Physical Activity: Not on file  Stress: Not on file  Social Connections: Not on file    Allergies: No Known Allergies  Metabolic Disorder Labs: No results found for: HGBA1C, MPG No results found for: PROLACTIN Lab Results  Component Value Date   CHOL 165 03/08/2020   TRIG 82.0 03/08/2020   HDL 54.60 03/08/2020   CHOLHDL 3 03/08/2020   VLDL 16.4 03/08/2020   LDLCALC 94 03/08/2020   LDLCALC 52 10/26/2019   Lab Results  Component Value Date   TSH 0.06 (L) 04/26/2020   TSH 0.02 (L) 03/08/2020    Therapeutic Level Labs: No results found for: LITHIUM No results found for:  VALPROATE No components found for:  CBMZ  Current Medications: Current Outpatient Medications  Medication Sig Dispense Refill  . aspirin 81 MG EC tablet Take 1 tablet (81 mg total) by mouth daily. 90 tablet 3  . atorvastatin (LIPITOR) 10 MG tablet Take 1 tablet (10 mg total) by mouth daily at 6 PM. 90 tablet 3  . Cholecalciferol (VITAMIN D-3) 5000 units TABS Take by mouth.    . escitalopram (LEXAPRO) 5 MG tablet Take 1 tablet (5 mg total) by mouth daily. 90 tablet 1  . ezetimibe (ZETIA) 10 MG tablet Take 1 tablet (10 mg total) by mouth daily. 90 tablet 3  . HYDROcodone-acetaminophen (  NORCO/VICODIN) 5-325 MG tablet Take by mouth.    . levothyroxine (SYNTHROID) 112 MCG tablet Take 1 tablet (112 mcg total) by mouth daily before breakfast. 30 minutes before breakfast M-Sat and Sundays take 1/2 tablet. 90 tablet 3  . nitroGLYCERIN (NITROSTAT) 0.4 MG SL tablet Place 1 tablet (0.4 mg total) under the tongue every 5 (five) minutes x 3 doses as needed for chest pain. 30 tablet 1  . Omega-3 1000 MG CAPS Take 1,000 mg by mouth daily.     . ticagrelor (BRILINTA) 90 MG TABS tablet Take 1 tablet (90 mg total) by mouth 2 (two) times daily. 60 tablet 11   No current facility-administered medications for this visit.     Musculoskeletal: Strength & Muscle Tone: UTA Gait & Station: normal Patient leans: N/A  Psychiatric Specialty Exam: Review of Systems  Psychiatric/Behavioral: The patient is nervous/anxious.   All other systems reviewed and are negative.   There were no vitals taken for this visit.There is no height or weight on file to calculate BMI.  General Appearance: Casual  Eye Contact:  Fair  Speech:  Clear and Coherent  Volume:  Normal  Mood:  Anxious Coping well  Affect:  Congruent  Thought Process:  Goal Directed and Descriptions of Associations: Intact  Orientation:  Full (Time, Place, and Person)  Thought Content: Logical   Suicidal Thoughts:  No  Homicidal Thoughts:  No  Memory:   Immediate;   Fair Recent;   Fair Remote;   Fair  Judgement:  Fair  Insight:  Fair  Psychomotor Activity:  Normal  Concentration:  Concentration: Fair and Attention Span: Fair  Recall:  [employer] of Knowledge: Fair  Language: Fair  Akathisia:  No  Handed:  Right  AIMS (if indicated): UTA  Assets:  Communication Skills Desire for Improvement Housing Social Support  ADL's:  Intact  Cognition: WNL  Sleep:  Fair   Screenings: GAD-7   Flowsheet Row Office Visit from 12/24/2019 in [place] Video Visit from 08/27/2019 in [place] Office Visit from 08/19/2019 in [place] Office Visit from 02/17/2018 in [place]  Total GAD-7 Score 0 0 0 9    PHQ2-9   [place] Office Visit from 12/24/2019 in [place] Video Visit from 12/08/2019 in [place] Video Visit from 08/27/2019 in [place] from 08/19/2019 in [place] Visit from 02/17/2018 in [place]  PHQ-2 Total Score 0 0 0 0 4  PHQ-9 Total Score -- 1 -- -- 11       Assessment and Plan: Chalese Peach is a 64 year old Caucasian female, divorced, employed, lives in [place], has a history of depression, GERD, hypothyroidism was evaluated by telemedicine today.  Patient continues to be on Lexapro, and although she does have situational stressors is coping well.  Plan as noted below.  Plan MDD in remission Continue Lexapro 5 mg p.o. daily. Discussed with patient that Lexapro could also contribute to elevated liver enzymes, she will monitor herself closely .Will wait repeat labs before making further changes.She will follow up with PMD.  Tobacco use disorder-unstable Provided counseling  Follow-up in clinic in 6 months or sooner if needed.  I have spent atleast 20 minutes face to face by video with patient  today. More than 50 % of the time was spent for preparing to see the patient ( e.g., review of test, records ),ordering  medications and test ,psychoeducation and supportive psychotherapy and care coordination,as well as documenting clinical information in electronic health record,interpreting and communication of test results This note was generated in part or whole with voice recognition software. Voice recognition is usually quite accurate but there are transcription errors that can and very often do occur. I apologize for any typographical errors that were not detected and corrected.       Ursula Alert, MD 05/27/2020, 12:51 PM

## 2020-05-31 ENCOUNTER — Other Ambulatory Visit: Payer: Self-pay

## 2020-05-31 ENCOUNTER — Ambulatory Visit
Admission: RE | Admit: 2020-05-31 | Discharge: 2020-05-31 | Disposition: A | Discharge status: Home / Self Care | Payer: 59 | Source: Ambulatory Visit | Attending: Internal Medicine | Admitting: Internal Medicine

## 2020-05-31 DIAGNOSIS — R928 Other abnormal and inconclusive findings on diagnostic imaging of breast: Secondary | ICD-10-CM

## 2020-05-31 DIAGNOSIS — N6310 Unspecified lump in the right breast, unspecified quadrant: Secondary | ICD-10-CM | POA: Diagnosis present

## 2020-06-02 ENCOUNTER — Other Ambulatory Visit: Payer: Self-pay | Admitting: Internal Medicine

## 2020-06-02 ENCOUNTER — Encounter: Payer: Self-pay | Admitting: Internal Medicine

## 2020-06-02 DIAGNOSIS — N6310 Unspecified lump in the right breast, unspecified quadrant: Secondary | ICD-10-CM | POA: Insufficient documentation

## 2020-06-29 ENCOUNTER — Ambulatory Visit: Payer: 59 | Admitting: Internal Medicine

## 2020-07-08 ENCOUNTER — Other Ambulatory Visit: Payer: Self-pay

## 2020-07-08 ENCOUNTER — Ambulatory Visit (INDEPENDENT_AMBULATORY_CARE_PROVIDER_SITE_OTHER): Payer: 59 | Admitting: Internal Medicine

## 2020-07-08 ENCOUNTER — Encounter: Payer: Self-pay | Admitting: Internal Medicine

## 2020-07-08 VITALS — BP 108/74 | HR 85 | Temp 98.4°F | Ht 66.5 in | Wt 142.8 lb

## 2020-07-08 DIAGNOSIS — Z0000 Encounter for general adult medical examination without abnormal findings: Secondary | ICD-10-CM

## 2020-07-08 DIAGNOSIS — Z23 Encounter for immunization: Secondary | ICD-10-CM

## 2020-07-08 DIAGNOSIS — F17200 Nicotine dependence, unspecified, uncomplicated: Secondary | ICD-10-CM

## 2020-07-08 DIAGNOSIS — R748 Abnormal levels of other serum enzymes: Secondary | ICD-10-CM

## 2020-07-08 DIAGNOSIS — E785 Hyperlipidemia, unspecified: Secondary | ICD-10-CM

## 2020-07-08 DIAGNOSIS — F32A Depression, unspecified: Secondary | ICD-10-CM

## 2020-07-08 DIAGNOSIS — F419 Anxiety disorder, unspecified: Secondary | ICD-10-CM | POA: Diagnosis not present

## 2020-07-08 DIAGNOSIS — E039 Hypothyroidism, unspecified: Secondary | ICD-10-CM

## 2020-07-08 NOTE — Patient Instructions (Addendum)
Debrox ear wax drops 5-10 drops x 1 week   Earwax Buildup, Adult The ears produce a substance called earwax that helps keep bacteria out of the ear and protects the skin in the ear canal. Occasionally, earwax can build up in the ear and cause discomfort or hearing loss. What are the causes? This condition is caused by a buildup of earwax. Ear canals are self-cleaning. Ear wax is made in the outer part of the ear canal and generally falls out in small amounts over time. When the self-cleaning mechanism is not working, earwax builds up and can cause decreased hearing and discomfort. Attempting to clean ears with cotton swabs can push the earwax deep into the ear canal and cause decreased hearing and pain. What increases the risk? This condition is more likely to develop in people who:  Clean their ears often with cotton swabs.  Pick at their ears.  Use earplugs or in-ear headphones often, or wear hearing aids. The following factors may also make you more likely to develop this condition:  Being female.  Being of older age.  Naturally producing more earwax.  Having narrow ear canals.  Having earwax that is overly thick or sticky.  Having excess hair in the ear canal.  Having eczema.  Being dehydrated. What are the signs or symptoms? Symptoms of this condition include:  Reduced or muffled hearing.  A feeling of fullness in the ear or feeling that the ear is plugged.  Fluid coming from the ear.  Ear pain or an itchy ear.  Ringing in the ear.  Coughing.  Balance problems.  An obvious piece of earwax that can be seen inside the ear canal. How is this diagnosed? This condition may be diagnosed based on:  Your symptoms.  Your medical history.  An ear exam. During the exam, your health care provider will look into your ear with an instrument called an otoscope. You may have tests, including a hearing test. How is this treated? This condition may be treated by:  Using  ear drops to soften the earwax.  Having the earwax removed by a health care provider. The health care provider may: ? Flush the ear with water. ? Use an instrument that has a loop on the end (curette). ? Use a suction device.  Having surgery to remove the wax buildup. This may be done in severe cases. Follow these instructions at home:  Take over-the-counter and prescription medicines only as told by your health care provider.  Do not put any objects, including cotton swabs, into your ear. You can clean the opening of your ear canal with a washcloth or facial tissue.  Follow instructions from your health care provider about cleaning your ears. Do not overclean your ears.  Drink enough fluid to keep your urine pale yellow. This will help to thin the earwax.  Keep all follow-up visits as told. If earwax builds up in your ears often or if you use hearing aids, consider seeing your health care provider for routine, preventive ear cleanings. Ask your health care provider how often you should schedule your cleanings.  If you have hearing aids, clean them according to instructions from the manufacturer and your health care provider.   Contact a health care provider if:  You have ear pain.  You develop a fever.  You have pus or other fluid coming from your ear.  You have hearing loss.  You have ringing in your ears that does not go away.  You feel  like the room is spinning (vertigo).  Your symptoms do not improve with treatment. Get help right away if:  You have bleeding from the affected ear.  You have severe ear pain. Summary  Earwax can build up in the ear and cause discomfort or hearing loss.  The most common symptoms of this condition include reduced or muffled hearing, a feeling of fullness in the ear, or feeling that the ear is plugged.  This condition may be diagnosed based on your symptoms, your medical history, and an ear exam.  This condition may be treated by  using ear drops to soften the earwax or by having the earwax removed by a health care provider.  Do not put any objects, including cotton swabs, into your ear. You can clean the opening of your ear canal with a washcloth or facial tissue. This information is not intended to replace advice given to you by your health care provider. Make sure you discuss any questions you have with your health care provider. Document Revised: 09/15/2019 Document Reviewed: 09/15/2019 Elsevier Patient Education  [place].

## 2020-07-08 NOTE — Progress Notes (Signed)
Chief Complaint  Patient presents with  . Follow-up  . Immunizations   Annual  1. Weight lost down 40 lbs from 180 to 140s lbs  2. Hypothyroidism on levo 112 mcg qd doing 1/2 dose Saturdays for now will check labs upcoming mid 07/2020  3. Anxiety and depression tapering down on meds lexapro 10 to 5 mg qd mg per psych and doing well  4. CAD/HLD on zetia 10 and lipitor 10 no CP BP controlled on Brillinta 90 mg qd at least until 08/2020 but insurance will no longer cover will disc with cards Smoking 10 cigs or less cut back rec cessation 5. H/o elevated lfts recheck lipids and if elevated again disc US abdomen for now she wants to hold  Review of Systems  Constitutional: Positive for weight loss.  HENT: Negative for hearing loss.   Respiratory: Negative for shortness of breath.   Cardiovascular: Negative for chest pain.  Gastrointestinal: Negative for abdominal pain.  Musculoskeletal: Negative for falls and joint pain.  Skin: Negative for rash.  Neurological: Negative for headaches.  Psychiatric/Behavioral: Negative for depression.   Past Medical History:  Diagnosis Date  . Anxiety   . Barrett's esophagus   . Depression   . Diverticulitis   . History of chicken pox   . Thyroid disease    hypothyroidism  . UTI (urinary tract infection)    Past Surgical History:  Procedure Laterality Date  . ESOPHAGOGASTRODUODENOSCOPY (EGD) WITH PROPOFOL N/A 05/02/2018   Procedure: ESOPHAGOGASTRODUODENOSCOPY (EGD) WITH PROPOFOL;  Surgeon: Lucilla Lame, MD;  Location: [facility];  Service: Endoscopy;  Laterality: N/A;  . LEFT HEART CATH AND CORONARY ANGIOGRAPHY N/A 08/20/2019   Procedure: LEFT HEART CATH AND CORONARY ANGIOGRAPHY;  Surgeon: Minna Merritts, MD;  Location: [facility];  Service: Cardiovascular;  Laterality: N/A;  . TUBAL LIGATION     Family History  Problem Relation Age of Onset  . Depression Mother   . Anxiety disorder Mother   . Schizophrenia Mother   .  Paranoid behavior Mother   . Stroke Mother   . COPD Father   . Early death Father   . Hearing loss Father   . Heart disease Father   . CAD Father        s/p cabg x 3   . Depression Brother   . COPD Brother   . Hypertension Brother   . Kidney disease Brother   . Breast cancer Neg Hx    Social History   Socioeconomic History  . Marital status: Divorced    Spouse name: Not on file  . Number of children: 2  . Years of education: Not on file  . Highest education level: Bachelor's degree (e.g., BA, AB, BS)  Occupational History    Comment: part time  Tobacco Use  . Smoking status: Current Every Day Smoker    Packs/day: 0.50    Types: Cigarettes  . Smokeless tobacco: Never Used  Vaping Use  . Vaping Use: Never used  Substance and Sexual Activity  . Alcohol use: Not Currently    Alcohol/week: 1.0 standard drink    Types: 1 Glasses of wine per week  . Drug use: Yes    Types: Marijuana  . Sexual activity: Not Currently  Other Topics Concern  . Not on file  Social History Narrative   Kids    No guns    Wears seat belt    Safe in relationship    Smoker    Lives with  Lexicographer    Social Determinants of Health   Financial Resource Strain: Not on file  Food Insecurity: Not on file  Transportation Needs: Not on file  Physical Activity: Not on file  Stress: Not on file  Social Connections: Not on file  Intimate Partner Violence: Not on file   Current Meds  Medication Sig  . aspirin 81 MG EC tablet Take 1 tablet (81 mg total) by mouth daily.  Marland Kitchen atorvastatin (LIPITOR) 10 MG tablet Take 1 tablet (10 mg total) by mouth daily at 6 PM.  . Cholecalciferol (VITAMIN D-3) 5000 units TABS Take by mouth.  . escitalopram (LEXAPRO) 5 MG tablet Take 1 tablet (5 mg total) by mouth daily.  Marland Kitchen ezetimibe (ZETIA) 10 MG tablet Take 1 tablet (10 mg total) by mouth daily.  Marland Kitchen levothyroxine (SYNTHROID) 112 MCG tablet Take 1 tablet (112 mcg total) by mouth daily before breakfast. 30  minutes before breakfast M-Sat and Sundays take 1/2 tablet.  . Omega-3 1000 MG CAPS Take 1,000 mg by mouth daily.   . ticagrelor (BRILINTA) 90 MG TABS tablet Take 1 tablet (90 mg total) by mouth 2 (two) times daily.   No Known Allergies Recent Results (from the past 2160 hour(s))  Hepatic function panel     Status: Abnormal   Collection Time: 04/26/20  8:06 AM  Result Value Ref Range   Total Bilirubin 0.5 0.2 - 1.2 mg/dL   Bilirubin, Direct 0.1 0.0 - 0.3 mg/dL   Alkaline Phosphatase 116 39 - 117 U/L   AST 38 (H) 0 - 37 U/L   ALT 53 (H) 0 - 35 U/L   Total Protein 6.4 6.0 - 8.3 g/dL   Albumin 4.2 3.5 - 5.2 g/dL  TSH     Status: Abnormal   Collection Time: 04/26/20  8:06 AM  Result Value Ref Range   TSH 0.06 (L) 0.35 - 4.50 uIU/mL  Vitamin D (25 hydroxy)     Status: None   Collection Time: 04/26/20  8:06 AM  Result Value Ref Range   VITD 61.11 30.00 - 100.00 ng/mL  Urinalysis, Routine w reflex microscopic     Status: None   Collection Time: 04/26/20  8:06 AM  Result Value Ref Range   Color, Urine YELLOW YELLOW   APPearance CLEAR CLEAR   Specific Gravity, Urine 1.010 1.001 - 1.03   pH 6.0 5.0 - 8.0   Glucose, UA NEGATIVE NEGATIVE   Bilirubin Urine NEGATIVE NEGATIVE   Ketones, ur NEGATIVE NEGATIVE   Hgb urine dipstick NEGATIVE NEGATIVE   Protein, ur NEGATIVE NEGATIVE   Nitrite NEGATIVE NEGATIVE   Leukocytes,Ua NEGATIVE NEGATIVE   Objective  Body mass index is 22.7 kg/m. Wt Readings from Last 3 Encounters:  07/08/20 142 lb 12.8 oz (64.8 kg)  04/27/20 141 lb 6 oz (64.1 kg)  12/24/19 161 lb 12.8 oz (73.4 kg)   Temp Readings from Last 3 Encounters:  07/08/20 98.4 F (36.9 C) (Oral)  12/24/19 98.4 F (36.9 C) (Oral)  08/21/19 98.5 F (36.9 C) (Oral)   BP Readings from Last 3 Encounters:  07/08/20 108/74  04/27/20 100/66  12/24/19 118/76   Pulse Readings from Last 3 Encounters:  07/08/20 85  04/27/20 77  12/24/19 82    Physical Exam Vitals and nursing note  reviewed.  Constitutional:      Appearance: Normal appearance. She is well-developed and well-groomed.  HENT:     Head: Normocephalic and atraumatic.  Eyes:  Conjunctiva/sclera: Conjunctivae normal.     Pupils: Pupils are equal, round, and reactive to light.  Cardiovascular:     Rate and Rhythm: Normal rate and regular rhythm.     Heart sounds: Normal heart sounds. No murmur heard.   Pulmonary:     Effort: Pulmonary effort is normal.     Breath sounds: Normal breath sounds.  Skin:    General: Skin is warm and dry.  Neurological:     General: No focal deficit present.     Mental Status: She is alert and oriented to person, place, and time. Mental status is at baseline.     Gait: Gait normal.  Psychiatric:        Attention and Perception: Attention and perception normal.        Mood and Affect: Mood and affect normal.        Speech: Speech normal.        Behavior: Behavior normal. Behavior is cooperative.        Thought Content: Thought content normal.        Cognition and Memory: Cognition and memory normal.        Judgment: Judgment normal.     Assessment  Plan  Annual physical exam Needs flu shot - Plan: Flu Vaccine QUAD 6+ mos PF IM (Fluarix Quad PF) pna 23, shingrix think about future  tdap 03/08/20  Hep C neg 03/10/18  Declines MMR and hep B check covid 19 2/2  had mid 04/2020 [facility]   12/10/20mammogramnegative, ordered  right breast oil cyst 05/31/20 f/u 08/2020 order in   Pap at f/u h/o abnormal x 1 -pap11/1/19 neg neg HPV due in 04/11/21  Colonoscopy had 08/27/11 and EGD due repeat h/o barretts;EGD with Dr. Allen Norris 11/22/19no barretts  rec smoking cessation smoking 10-12 cig/qd no FH lung cancer never smoked more than this  Derm sch 11/18 referred Dr. Nicole Kindred sch 03/27/21    Hypothyroidism, unspecified type On levo 112 and 1/2 pill on Sat  Recheck thyroid upcoming   Anxiety and depression On lexapro 5 mg qd   Hyperlipidemia,  unspecified hyperlipidemia type On lipitor 10 and zetia 10 mg qd     Provider: Dr. Olivia Mackie McLean-Scocuzza-[facility]

## 2020-07-21 ENCOUNTER — Telehealth: Payer: Self-pay | Admitting: Internal Medicine

## 2020-07-21 NOTE — Telephone Encounter (Signed)
Please sch ear irrigation nurse visit asap and make sure im here so we can look

## 2020-07-21 NOTE — Telephone Encounter (Signed)
the drops that she is using for her ears isn't working. She feels like she is getting an ear infection. She needs to know what the next step is

## 2020-07-21 NOTE — Telephone Encounter (Signed)
Please advise 

## 2020-07-22 NOTE — Telephone Encounter (Signed)
Patient scheduled for virtual 2/15 at 10:00 am

## 2020-07-22 NOTE — Telephone Encounter (Signed)
Patient called back today about her ear she stated that she is all stopped up and blowing green stuff out of her nose

## 2020-07-26 ENCOUNTER — Telehealth: Payer: 59 | Admitting: Internal Medicine

## 2020-07-26 ENCOUNTER — Ambulatory Visit: Payer: 59

## 2020-07-29 ENCOUNTER — Other Ambulatory Visit: Payer: Self-pay

## 2020-07-29 ENCOUNTER — Other Ambulatory Visit (INDEPENDENT_AMBULATORY_CARE_PROVIDER_SITE_OTHER): Payer: 59

## 2020-07-29 DIAGNOSIS — F419 Anxiety disorder, unspecified: Secondary | ICD-10-CM

## 2020-07-29 DIAGNOSIS — Z0000 Encounter for general adult medical examination without abnormal findings: Secondary | ICD-10-CM

## 2020-07-29 DIAGNOSIS — F17200 Nicotine dependence, unspecified, uncomplicated: Secondary | ICD-10-CM

## 2020-07-29 DIAGNOSIS — R748 Abnormal levels of other serum enzymes: Secondary | ICD-10-CM

## 2020-07-29 DIAGNOSIS — E039 Hypothyroidism, unspecified: Secondary | ICD-10-CM

## 2020-07-29 DIAGNOSIS — E785 Hyperlipidemia, unspecified: Secondary | ICD-10-CM

## 2020-07-29 DIAGNOSIS — F32A Depression, unspecified: Secondary | ICD-10-CM

## 2020-07-29 LAB — COMPREHENSIVE METABOLIC PANEL
ALT: 25 U/L (ref 0–35)
AST: 21 U/L (ref 0–37)
Albumin: 3.8 g/dL (ref 3.5–5.2)
Alkaline Phosphatase: 97 U/L (ref 39–117)
BUN: 20 mg/dL (ref 6–23)
CO2: 27 mEq/L (ref 19–32)
Calcium: 9.4 mg/dL (ref 8.4–10.5)
Chloride: 107 mEq/L (ref 96–112)
Creatinine, Ser: 0.96 mg/dL (ref 0.40–1.20)
GFR: 63.12 mL/min (ref >60.00)
Glucose, Bld: 92 mg/dL (ref 70–99)
Potassium: 4.5 mEq/L (ref 3.5–5.1)
Sodium: 141 mEq/L (ref 135–145)
Total Bilirubin: 0.5 mg/dL (ref 0.2–1.2)
Total Protein: 6 g/dL (ref 6.0–8.3)

## 2020-07-29 LAB — LIPID PANEL
Cholesterol: 132 mg/dL (ref 0–200)
HDL: 62.1 mg/dL (ref >39.00)
LDL Cholesterol: 57 mg/dL (ref 0–99)
NonHDL: 70.19
Total CHOL/HDL Ratio: 2
Triglycerides: 67 mg/dL (ref 0.0–149.0)
VLDL: 13.4 mg/dL (ref 0.0–40.0)

## 2020-07-29 LAB — CBC WITH DIFFERENTIAL/PLATELET
Basophils Absolute: 0.1 10*3/uL (ref 0.0–0.1)
Basophils Relative: 0.8 % (ref 0.0–3.0)
Eosinophils Absolute: 0.6 10*3/uL (ref 0.0–0.7)
Eosinophils Relative: 6.4 % — ABNORMAL HIGH (ref 0.0–5.0)
HCT: 44.6 % (ref 36.0–46.0)
Hemoglobin: 14.8 g/dL (ref 12.0–15.0)
Lymphocytes Relative: 21.9 % (ref 12.0–46.0)
Lymphs Abs: 2 10*3/uL (ref 0.7–4.0)
MCHC: 33.2 g/dL (ref 30.0–36.0)
MCV: 92.5 fl (ref 78.0–100.0)
Monocytes Absolute: 0.5 10*3/uL (ref 0.1–1.0)
Monocytes Relative: 5.8 % (ref 3.0–12.0)
Neutro Abs: 5.9 10*3/uL (ref 1.4–7.7)
Neutrophils Relative %: 65.1 % (ref 43.0–77.0)
Platelets: 193 10*3/uL (ref 150.0–400.0)
RBC: 4.82 Mil/uL (ref 3.87–5.11)
RDW: 14.8 % (ref 11.5–15.5)
WBC: 9.1 10*3/uL (ref 4.0–10.5)

## 2020-07-29 LAB — TSH: TSH: 0.18 u[IU]/mL — ABNORMAL LOW (ref 0.35–4.50)

## 2020-08-15 ENCOUNTER — Other Ambulatory Visit: Payer: Self-pay | Admitting: Internal Medicine

## 2020-08-15 ENCOUNTER — Telehealth: Payer: Self-pay | Admitting: Cardiovascular Disease

## 2020-08-15 ENCOUNTER — Telehealth: Payer: Self-pay | Admitting: Internal Medicine

## 2020-08-15 DIAGNOSIS — E039 Hypothyroidism, unspecified: Secondary | ICD-10-CM

## 2020-08-15 DIAGNOSIS — I2510 Atherosclerotic heart disease of native coronary artery without angina pectoris: Secondary | ICD-10-CM

## 2020-08-15 MED ORDER — TICAGRELOR 90 MG PO TABS
90.0000 mg | ORAL_TABLET | Freq: Two times a day (BID) | ORAL | 1 refills | Status: DC
Start: 2020-08-15 — End: 2020-09-13

## 2020-08-15 MED ORDER — LEVOTHYROXINE SODIUM 100 MCG PO TABS: 100.0000 ug | ORAL_TABLET | Freq: Every day | ORAL | 3 refills | Status: DC

## 2020-08-15 NOTE — Telephone Encounter (Signed)
Pt called to get lab results °

## 2020-08-15 NOTE — Telephone Encounter (Signed)
Pt c/o medication issue:  1. Name of Medication: brilinta  2. How are you currently taking this medication (dosage and times per day)? 90 mg bid  3. Are you having a reaction (difficulty breathing--STAT)?   4. What is your medication issue? Patient was placed on this last march due to a heart attack. Patient was told she would need to take this for a year and is coming up on running out of medication. Patient is wanting to know if she needs to continue, how should she stop medication.  Patient also is questioning the aspirin dose

## 2020-08-15 NOTE — Telephone Encounter (Signed)
Was able to return Maureen Pena phone call r/t Brilinta and ASA, based on Dr Donivan Scull progress noted from 11/21  "Will continue aspirin Brilinta for now, can discontinue Brilinta or decrease dose after 1 year post event". 1 Year will be tomorrow post-event, advised to schedule an appt for an in-person evaluation to discuss medication change with Brilinta. Pt verbalized understanding, has only 6 days left of script with no refills, sent in 30-day supply to hold her over until viist on 4/5 at 08:40am. Will continue the ASA and Brilinita until then. Otherwise all questions or concerns were address and no additional concerns at this time. Agreeable to plan, will call back for anything further.

## 2020-08-15 NOTE — Telephone Encounter (Signed)
Patient informed and verbalized understanding.  See result note for details

## 2020-08-16 NOTE — Telephone Encounter (Signed)
Patient calling back in to state she spoke with pharmacy and her prescription would be over $400. Patient states her insurance does not cover this medication. Patient would like some advice on what to do to get around paying this large amount of medication. Patient did try using a coupon card but since her cost was over $300 dollars the coupon was invalid for her.  Please advise

## 2020-08-16 NOTE — Telephone Encounter (Signed)
Able to return pt's call. Advised that we can supply samples to cover her until her f/u appt with Dr. Rockey Situ on 4/5 to see if he d/c medication since it has been one-year post-cardiac event. Pt is very grateful of samples. She will pick them up today at the front office.   Brilinta 90 mg (8 bottles total to cover her with taking BID)  Lot [id number] (1 bottle) Exp 07/2022  Lot [id number]  (2 bottles) Exp 09/09/2022  Lot [id number] (5 bottles) Exp 01/09/2023

## 2020-08-30 ENCOUNTER — Ambulatory Visit
Admission: RE | Admit: 2020-08-30 | Discharge: 2020-08-30 | Disposition: A | Discharge status: Home / Self Care | Payer: 59 | Source: Ambulatory Visit | Attending: Internal Medicine | Admitting: Internal Medicine

## 2020-08-30 ENCOUNTER — Other Ambulatory Visit: Payer: Self-pay

## 2020-08-30 DIAGNOSIS — N6310 Unspecified lump in the right breast, unspecified quadrant: Secondary | ICD-10-CM | POA: Diagnosis not present

## 2020-09-12 NOTE — Progress Notes (Signed)
Cardiology Office Note  Date:  09/13/2020   ID:  Maureen Pena, DOB 03-09-1956, MRN [id number]  PCP:  McLean-Scocuzza, Nino Glow, MD   Chief Complaint  Patient presents with  . 12 month follow up     Patient would like to discuss coming off the Brilinta or switching to another medications due to insurance not covering it anymore and the cost is $450.00 every month. Medications reviewed by the patient verbally.     HPI:  65 year old woman with history of  smoking,  hyperlipidemia,  strong family history of coronary disease  Late presenting STEMI, initial event August 16, 2019 Severe single-vessel disease proximal to mid left circumflex after OM1 takeoff Collaterals noted  medical management was recommended Who presents for follow up of her CAD  Brilinta going to be $450, Received some samples, needs to discuss whether she needs to stay on antiplatelet therapy 1 year following her cardiac catheterization March 2021  Happy with her lipid panel On the lower dose lipitor/zetia, LFTS normal LDL 57, at goal  Work hard to get her weight down Weight 176 to 148 over the past year Eating well  Still smoking, trying to quit  No sx from PVC seen on today's EKG  EKG personally reviewed by myself on todays visit NSR rate 76 bpm, PVCs  Other past medical history reviewed Last seen in clinic May 2021 At that time was still smoking, previously tried Chantix Had not tried other modalities Was active  Other past medical history reviewed chest pain August 16, 2019 developed 15 minutes of chest pain radiating through to her back between her scapulas.   did not seek out assistance, Seen by primary care March 10, lab work performed showing elevated D-dimer, elevated troponin  presented to the emergency room, initial EKG concerning for late presenting inferior wall STEMI.     PMH:   has a past medical history of Anxiety, Barrett's esophagus, Depression, Diverticulitis, History of  chicken pox, Thyroid disease, and UTI (urinary tract infection).  PSH:    Past Surgical History:  Procedure Laterality Date  . ESOPHAGOGASTRODUODENOSCOPY (EGD) WITH PROPOFOL N/A 05/02/2018   Procedure: ESOPHAGOGASTRODUODENOSCOPY (EGD) WITH PROPOFOL;  Surgeon: Lucilla Lame, MD;  Location: [facility] [place];  Service: Endoscopy;  Laterality: N/A;  . LEFT HEART CATH AND CORONARY ANGIOGRAPHY N/A 08/20/2019   Procedure: LEFT HEART CATH AND CORONARY ANGIOGRAPHY;  Surgeon: Minna Merritts, MD;  Location: [facility];  Service: Cardiovascular;  Laterality: N/A;  . TUBAL LIGATION      Current Outpatient Medications  Medication Sig Dispense Refill  . aspirin 81 MG EC tablet Take 1 tablet (81 mg total) by mouth daily. 90 tablet 3  . atorvastatin (LIPITOR) 10 MG tablet Take 1 tablet (10 mg total) by mouth daily at 6 PM. 90 tablet 3  . Cholecalciferol (VITAMIN D-3) 5000 units TABS Take by mouth.    . escitalopram (LEXAPRO) 5 MG tablet Take 1 tablet (5 mg total) by mouth daily. 90 tablet 1  . ezetimibe (ZETIA) 10 MG tablet Take 1 tablet (10 mg total) by mouth daily. 90 tablet 3  . levothyroxine (SYNTHROID) 100 MCG tablet Take 1 tablet (100 mcg total) by mouth daily before breakfast. 30 minutes before breakfast 6 days a week and 1/2 pill 1 day a week. Stop 112 dose 90 tablet 3  . nitroGLYCERIN (NITROSTAT) 0.4 MG SL tablet Place 1 tablet (0.4 mg total) under the tongue every 5 (five) minutes x 3 doses as needed for chest  pain. 30 tablet 1  . Omega-3 1000 MG CAPS Take 1,000 mg by mouth daily.     . ticagrelor (BRILINTA) 90 MG TABS tablet Take 1 tablet (90 mg total) by mouth 2 (two) times daily. 60 tablet 1   No current facility-administered medications for this visit.     Allergies:   Patient has no known allergies.   Social History:  The patient  reports that she has been smoking cigarettes. She has been smoking about 0.50 packs per day. She has never used smokeless tobacco. She reports  previous alcohol use of about 1.0 standard drink of alcohol per week. She reports current drug use. Drug: Marijuana.   Family History:   family history includes Anxiety disorder in her mother; CAD in her father; COPD in her brother and father; Depression in her brother and mother; Early death in her father; Hearing loss in her father; Heart disease in her father; Hypertension in her brother; Kidney disease in her brother; Paranoid behavior in her mother; Schizophrenia in her mother; Stroke in her mother.    Review of Systems: Review of Systems  Constitutional: Negative.   HENT: Negative.   Respiratory: Negative.   Cardiovascular: Negative.   Gastrointestinal: Negative.   Musculoskeletal: Negative.   Neurological: Negative.   Psychiatric/Behavioral: Negative.   All other systems reviewed and are negative.   PHYSICAL EXAM: VS:  BP 110/80 (BP Location: Left Arm, Patient Position: Sitting, Cuff Size: Normal)   Pulse 76   Ht 5' 6.5" (1.689 m)   Wt 148 lb 6 oz (67.3 kg)   SpO2 98%   BMI 23.59 kg/m  , BMI Body mass index is 23.59 kg/m. GEN: Well nourished, well developed, in no acute distress HEENT: normal Neck: no JVD, carotid bruits, or masses Cardiac: RRR; no murmurs, rubs, or gallops,no edema  Respiratory:  clear to auscultation bilaterally, normal work of breathing GI: soft, nontender, nondistended, + BS MS: no deformity or atrophy Skin: warm and dry, no rash Neuro:  Strength and sensation are intact Psych: euthymic mood, full affect   Recent Labs: 07/29/2020: ALT 25; BUN 20; Creatinine, Ser 0.96; Hemoglobin 14.8; Platelets 193.0; Potassium 4.5; Sodium 141; TSH 0.18    Lipid Panel Lab Results  Component Value Date   CHOL 132 07/29/2020   HDL 62.10 07/29/2020   LDLCALC 57 07/29/2020   TRIG 67.0 07/29/2020      Wt Readings from Last 3 Encounters:  09/13/20 148 lb 6 oz (67.3 kg)  07/08/20 142 lb 12.8 oz (64.8 kg)  04/27/20 141 lb 6 oz (64.1 kg)      ASSESSMENT  AND PLAN:  Problem List Items Addressed This Visit   None   Visit Diagnoses    Coronary artery disease of native artery of native heart with stable angina pectoris ([place])    -  Primary   Relevant Orders   EKG 12-Lead   Hyperlipidemia LDL goal <70       Nonrheumatic mitral valve regurgitation       Relevant Orders   EKG 12-Lead   Smoker         Coronary disease with stable angina Will stop brilinta as it has been 1 year on dual antiplatelet Stay on asa 81 daily Lipids at goal Smoking cessation strongly recommended, discussed allergies for smoking cessation including generic Chantix, patches, other modalities  Smoker smoking less, trying to quit We have encouraged her to continue to work on weaning her cigarettes and smoking cessation. She will continue to  work on this and does not want any assistance with chantix.   Hyperlipidemia Cholesterol is at goal on the current lipid regimen. No changes to the medications were made.  Hypothyroid followed by PMD  Transaminitis Back to normal    Total encounter time more than 25 minutes  Greater than 50% was spent in counseling and coordination of care with the patient    Signed, Esmond Plants, M.D., Ph.D. [place], [place]

## 2020-09-13 ENCOUNTER — Encounter: Payer: Self-pay | Admitting: Cardiovascular Disease

## 2020-09-13 ENCOUNTER — Ambulatory Visit (INDEPENDENT_AMBULATORY_CARE_PROVIDER_SITE_OTHER): Payer: 59 | Admitting: Cardiovascular Disease

## 2020-09-13 ENCOUNTER — Other Ambulatory Visit: Payer: Self-pay

## 2020-09-13 VITALS — BP 110/80 | HR 76 | Ht 66.5 in | Wt 148.4 lb

## 2020-09-13 DIAGNOSIS — I340 Nonrheumatic mitral (valve) insufficiency: Secondary | ICD-10-CM | POA: Diagnosis not present

## 2020-09-13 DIAGNOSIS — E785 Hyperlipidemia, unspecified: Secondary | ICD-10-CM

## 2020-09-13 DIAGNOSIS — F17200 Nicotine dependence, unspecified, uncomplicated: Secondary | ICD-10-CM

## 2020-09-13 DIAGNOSIS — I25118 Atherosclerotic heart disease of native coronary artery with other forms of angina pectoris: Secondary | ICD-10-CM

## 2020-09-13 NOTE — Patient Instructions (Signed)
Medication Instructions:  No changes  If you need a refill on your cardiac medications before your next appointment, please call your pharmacy.    Lab work: No new labs needed   If you have labs (blood work) drawn today and your tests are completely normal, you will receive your results only by: . MyChart Message (if you have MyChart) OR . A paper copy in the mail If you have any lab test that is abnormal or we need to change your treatment, we will call you to review the results.   Testing/Procedures: No new testing needed   Follow-Up: At [facility], you and your health needs are our priority.  As part of our continuing mission to provide you with exceptional heart care, we have created designated Provider Care Teams.  These Care Teams include your primary Cardiologist (physician) and Advanced Practice Providers (APPs -  Physician Assistants and Nurse Practitioners) who all work together to provide you with the care you need, when you need it.  . You will need a follow up appointment in 12 months  . Providers on your designated Care Team:   . Christopher Berge, NP . Ryan Dunn, PA-C . Jacquelyn Visser, PA-C  Any Other Special Instructions Will Be Listed Below (If Applicable).  COVID-19 Vaccine Information can be found at: [email or web address] For questions related to vaccine distribution or appointments, please email [email or web address] or call [phone].     

## 2020-10-04 ENCOUNTER — Other Ambulatory Visit: Payer: Self-pay

## 2020-10-04 ENCOUNTER — Other Ambulatory Visit (INDEPENDENT_AMBULATORY_CARE_PROVIDER_SITE_OTHER): Payer: 59

## 2020-10-04 DIAGNOSIS — E039 Hypothyroidism, unspecified: Secondary | ICD-10-CM | POA: Diagnosis not present

## 2020-10-04 LAB — TSH: TSH: 0.78 u[IU]/mL (ref 0.35–4.50)

## 2020-11-08 ENCOUNTER — Ambulatory Visit (INDEPENDENT_AMBULATORY_CARE_PROVIDER_SITE_OTHER): Payer: 59 | Admitting: Internal Medicine

## 2020-11-08 ENCOUNTER — Other Ambulatory Visit: Payer: Self-pay

## 2020-11-08 ENCOUNTER — Encounter: Payer: Self-pay | Admitting: Internal Medicine

## 2020-11-08 VITALS — BP 104/70 | HR 71 | Temp 98.5°F | Ht 66.5 in | Wt 151.0 lb

## 2020-11-08 DIAGNOSIS — E039 Hypothyroidism, unspecified: Secondary | ICD-10-CM

## 2020-11-08 DIAGNOSIS — Z1231 Encounter for screening mammogram for malignant neoplasm of breast: Secondary | ICD-10-CM

## 2020-11-08 DIAGNOSIS — I2121 ST elevation (STEMI) myocardial infarction involving left circumflex coronary artery: Secondary | ICD-10-CM | POA: Diagnosis not present

## 2020-11-08 DIAGNOSIS — I2510 Atherosclerotic heart disease of native coronary artery without angina pectoris: Secondary | ICD-10-CM

## 2020-11-08 DIAGNOSIS — E785 Hyperlipidemia, unspecified: Secondary | ICD-10-CM

## 2020-11-08 DIAGNOSIS — D721 Eosinophilia, unspecified: Secondary | ICD-10-CM

## 2020-11-08 MED ORDER — LEVOTHYROXINE SODIUM 100 MCG PO TABS: 100.0000 ug | ORAL_TABLET | Freq: Every day | ORAL | 3 refills | Status: DC

## 2020-11-08 MED ORDER — NITROGLYCERIN 0.4 MG SL SUBL: 0.4000 mg | SUBLINGUAL_TABLET | SUBLINGUAL | 2 refills | Status: DC | PRN

## 2020-11-08 NOTE — Patient Instructions (Addendum)
covid 19 booster prevnar then pneumonia 23 in 1 year  shingrix x 2 doses   Mammogram due 05/31/21 call and schedule   Pneumococcal Conjugate Vaccine (Prevnar 13) Suspension for Injection What is this medicine? PNEUMOCOCCAL VACCINE (NEU mo KOK al vak SEEN) is a vaccine used to prevent pneumococcus bacterial infections. These bacteria can cause serious infections like pneumonia, meningitis, and blood infections. This vaccine will lower your chance of getting pneumonia. If you do get pneumonia, it can make your symptoms milder and your illness shorter. This vaccine will not treat an infection and will not cause infection. This vaccine is recommended for infants and young children, adults with certain medical conditions, and adults 50 years or older. This medicine may be used for other purposes; ask your health care provider or pharmacist if you have questions. COMMON BRAND NAME(S): Prevnar, Prevnar 13 What should I tell my health care provider before I take this medicine? They need to know if you have any of these conditions:  bleeding problems  fever  immune system problems  an unusual or allergic reaction to pneumococcal vaccine, diphtheria toxoid, other vaccines, latex, other medicines, foods, dyes, or preservatives  pregnant or trying to get pregnant  breast-feeding How should I use this medicine? This vaccine is for injection into a muscle. It is given by a health care professional. A copy of Vaccine Information Statements will be given before each vaccination. Read this sheet carefully each time. The sheet may change frequently. Talk to your pediatrician regarding the use of this medicine in children. While this drug may be prescribed for children as young as 63 weeks old for selected conditions, precautions do apply. Overdosage: If you think you have taken too much of this medicine contact a poison control center or emergency room at once. NOTE: This medicine is only for you. Do not  share this medicine with others. What if I miss a dose? It is important not to miss your dose. Call your doctor or health care professional if you are unable to keep an appointment. What may interact with this medicine?  medicines for cancer chemotherapy  medicines that suppress your immune function  steroid medicines like prednisone or cortisone This list may not describe all possible interactions. Give your health care provider a list of all the medicines, herbs, non-prescription drugs, or dietary supplements you use. Also tell them if you smoke, drink alcohol, or use illegal drugs. Some items may interact with your medicine. What should I watch for while using this medicine? Mild fever and pain should go away in 3 days or less. Report any unusual symptoms to your doctor or health care professional. What side effects may I notice from receiving this medicine? Side effects that you should report to your doctor or health care professional as soon as possible:  allergic reactions like skin rash, itching or hives, swelling of the face, lips, or tongue  breathing problems  confused  fast or irregular heartbeat  fever over 102 degrees F  seizures  unusual bleeding or bruising  unusual muscle weakness Side effects that usually do not require medical attention (report to your doctor or health care professional if they continue or are bothersome):  aches and pains  diarrhea  fever of 102 degrees F or less  headache  irritable  loss of appetite  pain, tender at site where injected  trouble sleeping This list may not describe all possible side effects. Call your doctor for medical advice about side effects. You may  report side effects to FDA at 1-800-FDA-1088. Where should I keep my medicine? This does not apply. This vaccine is given in a clinic, pharmacy, doctor's office, or other health care setting and will not be stored at home. NOTE: This sheet is a summary. It may not  cover all possible information. If you have questions about this medicine, talk to your doctor, pharmacist, or health care provider.  2021 Elsevier/Gold Standard (2014-03-04 10:27:27)

## 2020-11-08 NOTE — Progress Notes (Signed)
Chief Complaint  Patient presents with  . Follow-up   F/u 1. hld controlled cad on lipitor 10 mg qhs and zetia 10 needs refill of ntg  Review of Systems  Constitutional: Negative for weight loss.  HENT: Negative for hearing loss.   Eyes: Negative for blurred vision.  Cardiovascular: Negative for chest pain.  Gastrointestinal: Negative for abdominal pain.  Musculoskeletal: Negative for falls and joint pain.  Skin: Negative for rash.  Neurological: Negative for headaches.  Psychiatric/Behavioral: Negative for depression.   Past Medical History:  Diagnosis Date  . Anxiety   . Barrett's esophagus   . Depression   . Diverticulitis   . History of chicken pox   . Thyroid disease    hypothyroidism  . UTI (urinary tract infection)    Past Surgical History:  Procedure Laterality Date  . ESOPHAGOGASTRODUODENOSCOPY (EGD) WITH PROPOFOL N/A 05/02/2018   Procedure: ESOPHAGOGASTRODUODENOSCOPY (EGD) WITH PROPOFOL;  Surgeon: Lucilla Lame, MD;  Location: [facility];  Service: Endoscopy;  Laterality: N/A;  . LEFT HEART CATH AND CORONARY ANGIOGRAPHY N/A 08/20/2019   Procedure: LEFT HEART CATH AND CORONARY ANGIOGRAPHY;  Surgeon: Minna Merritts, MD;  Location: [facility];  Service: Cardiovascular;  Laterality: N/A;  . TUBAL LIGATION     Family History  Problem Relation Age of Onset  . Depression Mother   . Anxiety disorder Mother   . Schizophrenia Mother   . Paranoid behavior Mother   . Stroke Mother   . COPD Father   . Early death Father   . Hearing loss Father   . Heart disease Father   . CAD Father        s/p cabg x 3   . Depression Brother   . COPD Brother   . Hypertension Brother   . Kidney disease Brother   . Pulmonary fibrosis Brother        died 66 07/2020  . Breast cancer Neg Hx    Social History   Socioeconomic History  . Marital status: Divorced    Spouse name: Not on file  . Number of children: 2  . Years of education: Not on file  . Highest  education level: Bachelor's degree (e.g., BA, AB, BS)  Occupational History    Comment: part time  Tobacco Use  . Smoking status: Current Every Day Smoker    Packs/day: 0.50    Types: Cigarettes  . Smokeless tobacco: Never Used  Vaping Use  . Vaping Use: Never used  Substance and Sexual Activity  . Alcohol use: Not Currently    Alcohol/week: 1.0 standard drink    Types: 1 Glasses of wine per week  . Drug use: Yes    Types: Marijuana  . Sexual activity: Not Currently  Other Topics Concern  . Not on file  Social History Narrative   Kids    No guns    Wears seat belt    Safe in relationship    Smoker    Lives with Lexicographer    Social Determinants of Health   Financial Resource Strain: Not on file  Food Insecurity: Not on file  Transportation Needs: Not on file  Physical Activity: Not on file  Stress: Not on file  Social Connections: Not on file  Intimate Partner Violence: Not on file   Current Meds  Medication Sig  . aspirin 81 MG EC tablet Take 1 tablet (81 mg total) by mouth daily.  Marland Kitchen atorvastatin (LIPITOR) 10 MG tablet Take  1 tablet (10 mg total) by mouth daily at 6 PM.  . Cholecalciferol (VITAMIN D-3) 5000 units TABS Take by mouth.  . escitalopram (LEXAPRO) 5 MG tablet Take 1 tablet (5 mg total) by mouth daily.  Marland Kitchen ezetimibe (ZETIA) 10 MG tablet Take 1 tablet (10 mg total) by mouth daily.  . Omega-3 1000 MG CAPS Take 1,000 mg by mouth daily.   . [DISCONTINUED] levothyroxine (SYNTHROID) 100 MCG tablet Take 1 tablet (100 mcg total) by mouth daily before breakfast. 30 minutes before breakfast 6 days a week and 1/2 pill 1 day a week. Stop 112 dose   No Known Allergies Recent Results (from the past 2160 hour(s))  TSH     Status: None   Collection Time: 10/04/20  8:30 AM  Result Value Ref Range   TSH 0.78 0.35 - 4.50 uIU/mL   Objective  Body mass index is 24.01 kg/m. Wt Readings from Last 3 Encounters:  11/08/20 151 lb (68.5 kg)  09/13/20 148 lb 6 oz (67.3  kg)  07/08/20 142 lb 12.8 oz (64.8 kg)   Temp Readings from Last 3 Encounters:  11/08/20 98.5 F (36.9 C) (Oral)  07/08/20 98.4 F (36.9 C) (Oral)  12/24/19 98.4 F (36.9 C) (Oral)   BP Readings from Last 3 Encounters:  11/08/20 104/70  09/13/20 110/80  07/08/20 108/74   Pulse Readings from Last 3 Encounters:  11/08/20 71  09/13/20 76  07/08/20 85    Physical Exam Vitals and nursing note reviewed.  Constitutional:      Appearance: Normal appearance. She is well-developed and well-groomed.  HENT:     Head: Normocephalic and atraumatic.  Eyes:     Conjunctiva/sclera: Conjunctivae normal.     Pupils: Pupils are equal, round, and reactive to light.  Cardiovascular:     Rate and Rhythm: Normal rate and regular rhythm.     Heart sounds: Normal heart sounds. No murmur heard.   Pulmonary:     Effort: Pulmonary effort is normal.     Breath sounds: Normal breath sounds.  Abdominal:     General: Abdomen is flat. Bowel sounds are normal.  Skin:    General: Skin is warm and dry.  Neurological:     General: No focal deficit present.     Mental Status: She is alert and oriented to person, place, and time. Mental status is at baseline.     Gait: Gait normal.  Psychiatric:        Attention and Perception: Attention and perception normal.        Mood and Affect: Mood and affect normal.        Speech: Speech normal.        Behavior: Behavior normal. Behavior is cooperative.        Thought Content: Thought content normal.        Cognition and Memory: Cognition and memory normal.        Judgment: Judgment normal.     Assessment  Plan  Hyperlipidemia, unspecified hyperlipidemia type - Plan: Comprehensive metabolic panel, Lipid panel, CBC with Differential/Platelet  ST elevation myocardial infarction involving left circumflex coronary artery (HCC) - Plan: CBC with Differential/Platelet Prn NTg refilled  F/u cards   Coronary artery disease involving native coronary artery of  native heart without angina pectoris - Plan: CBC with Differential/Platelet  Hypothyroidism, unspecified type - Plan: levothyroxine (SYNTHROID) 100 MCG tablet 6 days per week, CBC with Differential/Platelet, TSH  Eosinophilia, unspecified type - Plan: CBC with Differential/Platelet   HM  utd flu  Pfizer 3/3 Prevnar, pna 23, shingrix think about future given rx shingrix today 11/08/20 tdap 03/08/20  Hep C neg 03/10/18  Declines MMR and hep B check covid 19 3/3had mid 04/2020 [facility]   12/10/20mammogramnegative, ordered right breast oil cyst 05/31/20 f/u 08/2020 order in   Pap at f/u h/o abnormal x 1 -pap11/1/19 neg neg HPV due in 04/11/21  Colonoscopy had 08/27/11 and EGD due repeat h/o barretts;EGD with Dr. Allen Norris 11/22/19no barretts  rec smoking cessation smoking 10-12 cig/qd no FH lung cancer never smoked more than this  Derm sch 11/18referred Dr. Serafina Royals 03/27/21   Provider: Dr. Olivia Mackie McLean-Scocuzza-Internal Medicine

## 2020-11-17 ENCOUNTER — Telehealth (INDEPENDENT_AMBULATORY_CARE_PROVIDER_SITE_OTHER): Payer: 59 | Admitting: Psychiatry

## 2020-11-17 ENCOUNTER — Encounter: Payer: Self-pay | Admitting: Psychiatry

## 2020-11-17 ENCOUNTER — Other Ambulatory Visit: Payer: Self-pay

## 2020-11-17 DIAGNOSIS — G4700 Insomnia, unspecified: Secondary | ICD-10-CM | POA: Insufficient documentation

## 2020-11-17 DIAGNOSIS — F3342 Major depressive disorder, recurrent, in full remission: Secondary | ICD-10-CM | POA: Diagnosis not present

## 2020-11-17 DIAGNOSIS — F17200 Nicotine dependence, unspecified, uncomplicated: Secondary | ICD-10-CM | POA: Diagnosis not present

## 2020-11-17 MED ORDER — ESCITALOPRAM OXALATE 5 MG PO TABS: 5.0000 mg | ORAL_TABLET | Freq: Every day | ORAL | 1 refills | Status: DC

## 2020-11-17 NOTE — Progress Notes (Signed)
Virtual Visit via Video Note  I connected with Maureen Pena on 11/17/20 at 10:00 AM EDT by a video enabled telemedicine application and verified that I am speaking with the correct person using two identifiers.  Location Provider Location : Office Patient Location : Home  Participants: Patient , Provider    I discussed the limitations of evaluation and management by telemedicine and the availability of in person appointments. The patient expressed understanding and agreed to proceed.    I discussed the assessment and treatment plan with the patient. The patient was provided an opportunity to ask questions and all were answered. The patient agreed with the plan and demonstrated an understanding of the instructions.   The patient was advised to call back or seek an in-person evaluation if the symptoms worsen or if the condition fails to improve as anticipated.    [place] MD OP Progress Note  11/17/2020 1:43 PM Maureen Pena  MRN:  [id number]  Chief Complaint:  Chief Complaint   Follow-up; Depression; Anxiety    HPI: Maureen Pena is a 65 year old Caucasian female, employed, divorced, lives in [place], has a history of depression, tobacco use disorder, hypothyroidism was evaluated by telemedicine today.  Patient today reports she is currently busy fostering puppies.  She reports she has two 76-month-old puppies and that does have an impact on her sleep.  She reports she wakes up at around 4:30 AM every day since the puppies wakes her up.  She does take a 1-1/2 to 2 hours nap during the day to catch up on her sleep.  She reports when she does not have a good sleep that is when she starts feeling depressed.  She did have a few episodes of feeling down however overall she thinks she is doing well with regards to her depression.  She does have anxiety about the current financial situation.  She reports that does worry her.  She however has been coping okay.  She  continues to smoke cigarettes.  She denies any suicidality, homicidality or perceptual disturbances.  She is compliant on the Lexapro.  Denies side effects.  Patient denies any other concerns today.   Visit Diagnosis:    ICD-10-CM   1. MDD (major depressive disorder), recurrent, in full remission ([place])  F33.42 escitalopram (LEXAPRO) 5 MG tablet    2. Insomnia, unspecified type  G47.00     3. Tobacco use disorder  F17.200       Past Psychiatric History: I have reviewed past psychiatric history from progress note on 02/06/2018.  Past trials of Lexapro, hydroxyzine  Past Medical History:  Past Medical History:  Diagnosis Date   Anxiety    Barrett's esophagus    Depression    Diverticulitis    History of chicken pox    Thyroid disease    hypothyroidism   UTI (urinary tract infection)     Past Surgical History:  Procedure Laterality Date   ESOPHAGOGASTRODUODENOSCOPY (EGD) WITH PROPOFOL N/A 05/02/2018   Procedure: ESOPHAGOGASTRODUODENOSCOPY (EGD) WITH PROPOFOL;  Surgeon: Lucilla Lame, MD;  Location: [facility];  Service: Endoscopy;  Laterality: N/A;   LEFT HEART CATH AND CORONARY ANGIOGRAPHY N/A 08/20/2019   Procedure: LEFT HEART CATH AND CORONARY ANGIOGRAPHY;  Surgeon: Minna Merritts, MD;  Location: [facility];  Service: Cardiovascular;  Laterality: N/A;   TUBAL LIGATION      Family Psychiatric History: Reviewed family psychiatric history from progress note on 02/06/2018.  Family History:  Family History  Problem Relation Age of Onset  Depression Mother    Anxiety disorder Mother    Schizophrenia Mother    Paranoid behavior Mother    Stroke Mother    COPD Father    Early death Father    Hearing loss Father    Heart disease Father    CAD Father        s/p cabg x 3    Depression Brother    COPD Brother    Hypertension Brother    Kidney disease Brother    Pulmonary fibrosis Brother        died 65 08-Aug-2020   Breast cancer Neg Hx     Social  History: Reviewed social history from progress note on 02/06/2018 Social History   Socioeconomic History   Marital status: Divorced    Spouse name: Not on file   Number of children: 2   Years of education: Not on file   Highest education level: Bachelor's degree (e.g., BA, AB, BS)  Occupational History    Comment: part time  Tobacco Use   Smoking status: Every Day    Packs/day: 0.50    Pack years: 0.00    Types: Cigarettes   Smokeless tobacco: Never  Vaping Use   Vaping Use: Never used  Substance and Sexual Activity   Alcohol use: Not Currently    Alcohol/week: 1.0 standard drink    Types: 1 Glasses of wine per week   Drug use: Yes    Types: Marijuana   Sexual activity: Not Currently  Other Topics Concern   Not on file  Social History Narrative   Kids    No guns    Wears seat belt    Safe in relationship    Smoker    Lives with Lexicographer    Social Determinants of Health   Financial Resource Strain: Not on file  Food Insecurity: Not on file  Transportation Needs: Not on file  Physical Activity: Not on file  Stress: Not on file  Social Connections: Not on file    Allergies: No Known Allergies  Metabolic Disorder Labs: No results found for: HGBA1C, MPG No results found for: PROLACTIN Lab Results  Component Value Date   CHOL 132 07/29/2020   TRIG 67.0 07/29/2020   HDL 62.10 07/29/2020   CHOLHDL 2 07/29/2020   VLDL 13.4 07/29/2020   LDLCALC 57 07/29/2020   LDLCALC 94 03/08/2020   Lab Results  Component Value Date   TSH 0.78 10/04/2020   TSH 0.18 (L) 07/29/2020    Therapeutic Level Labs: No results found for: LITHIUM No results found for: VALPROATE No components found for:  CBMZ  Current Medications: Current Outpatient Medications  Medication Sig Dispense Refill   aspirin 81 MG EC tablet Take 1 tablet (81 mg total) by mouth daily. 90 tablet 3   atorvastatin (LIPITOR) 10 MG tablet Take 1 tablet (10 mg total) by mouth daily at 6 PM. 90 tablet 3    Cholecalciferol (VITAMIN D-3) 5000 units TABS Take by mouth.     ezetimibe (ZETIA) 10 MG tablet Take 1 tablet (10 mg total) by mouth daily. 90 tablet 3   levothyroxine (SYNTHROID) 100 MCG tablet Take 1 tablet (100 mcg total) by mouth daily before breakfast. 30 minutes before breakfast 6 days a week 90 tablet 3   nitroGLYCERIN (NITROSTAT) 0.4 MG SL tablet Place 1 tablet (0.4 mg total) under the tongue every 5 (five) minutes x 3 doses as needed for chest pain. 30 tablet 2  Omega-3 1000 MG CAPS Take 1,000 mg by mouth daily.      escitalopram (LEXAPRO) 5 MG tablet Take 1 tablet (5 mg total) by mouth daily. 90 tablet 1   No current facility-administered medications for this visit.     Musculoskeletal: Strength & Muscle Tone: UTA Gait & Station: UTA Patient leans: N/A  Psychiatric Specialty Exam: Review of Systems  Psychiatric/Behavioral:  Positive for sleep disturbance. The patient is nervous/anxious.   All other systems reviewed and are negative.  There were no vitals taken for this visit.There is no height or weight on file to calculate BMI.  General Appearance: Casual  Eye Contact:  Fair  Speech:  Clear and Coherent  Volume:  Normal  Mood:  Anxious coping well  Affect:  Congruent  Thought Process:  Goal Directed and Descriptions of Associations: Intact  Orientation:  Full (Time, Place, and Person)  Thought Content: Logical   Suicidal Thoughts:  No  Homicidal Thoughts:  No  Memory:  Immediate;   Fair Recent;   Fair Remote;   Fair  Judgement:  Fair  Insight:  Good  Psychomotor Activity:  Normal  Concentration:  Concentration: Fair and Attention Span: Fair  Recall:  [employer] of Knowledge: Fair  Language: Fair  Akathisia:  No  Handed:  Right  AIMS (if indicated): UTA  Assets:  Communication Skills Desire for Improvement Housing Leisure Time Physical Health Social Support Talents/Skills Transportation Vocational/Educational  ADL's:  Intact  Cognition: WNL   Sleep:  Poor   Screenings: GAD-7    Flowsheet Row Video Visit from 11/17/2020 in [facility] Office Visit from 12/24/2019 in [facility] Video Visit from 08/27/2019 in [facility] Office Visit from 08/19/2019 in [facility] Office Visit from 02/17/2018 in [facility]  Total GAD-7 Score 1 0 0 0 9      PHQ2-9    Flowsheet Row Video Visit from 11/17/2020 in [facility] Visit from 11/08/2020 in [facility] Office Visit from 07/08/2020 in [facility] Office Visit from 12/24/2019 in [facility] Video Visit from 12/08/2019 in [facility]  PHQ-2 Total Score 1 0 0 0 0  PHQ-9 Total Score -- 0 0 -- 1        Assessment and Plan: Maureen Pena is a 66 year old Caucasian female, divorced, employed, lives in [place], has a history of depression, GERD, anxiety, hypothyroidism was evaluated by telemedicine today.  Patient does have sleep problems more so because of not having a good sleep hygiene.  Discussed plan as noted below.  Plan MDD in remission Continue Lexapro 5 mg p.o. daily  Insomnia unspecified-unstable Patient is currently fostering puppies which does have an impact on her sleep. Discussed working on sleep hygiene.  Tobacco use disorder-unstable Provided counseling Provided information about smoking cessation class.  Follow-up in clinic in 6 months or sooner if needed.  This note was generated in part or whole with voice recognition software. Voice recognition is usually quite accurate but there are transcription errors that can and very often do occur. I apologize for any typographical errors that were not detected and corrected.      Ursula Alert, MD 11/18/2020, 8:52 AM

## 2021-01-27 ENCOUNTER — Encounter: Payer: Self-pay | Admitting: Internal Medicine

## 2021-02-27 IMAGING — MG MM DIGITAL DIAGNOSTIC UNILAT*R* W/ TOMO W/ CAD
4 series · 4 of 12 positions shown · non-contrast
Comparison: Previous exam(s).

CLINICAL DATA: 64-year-old female presenting as a recall from
screening for possible right breast mass.

EXAM:
DIGITAL DIAGNOSTIC RIGHT MAMMOGRAM WITH TOMO
ULTRASOUND RIGHT BREAST

[R ML synth-2D]
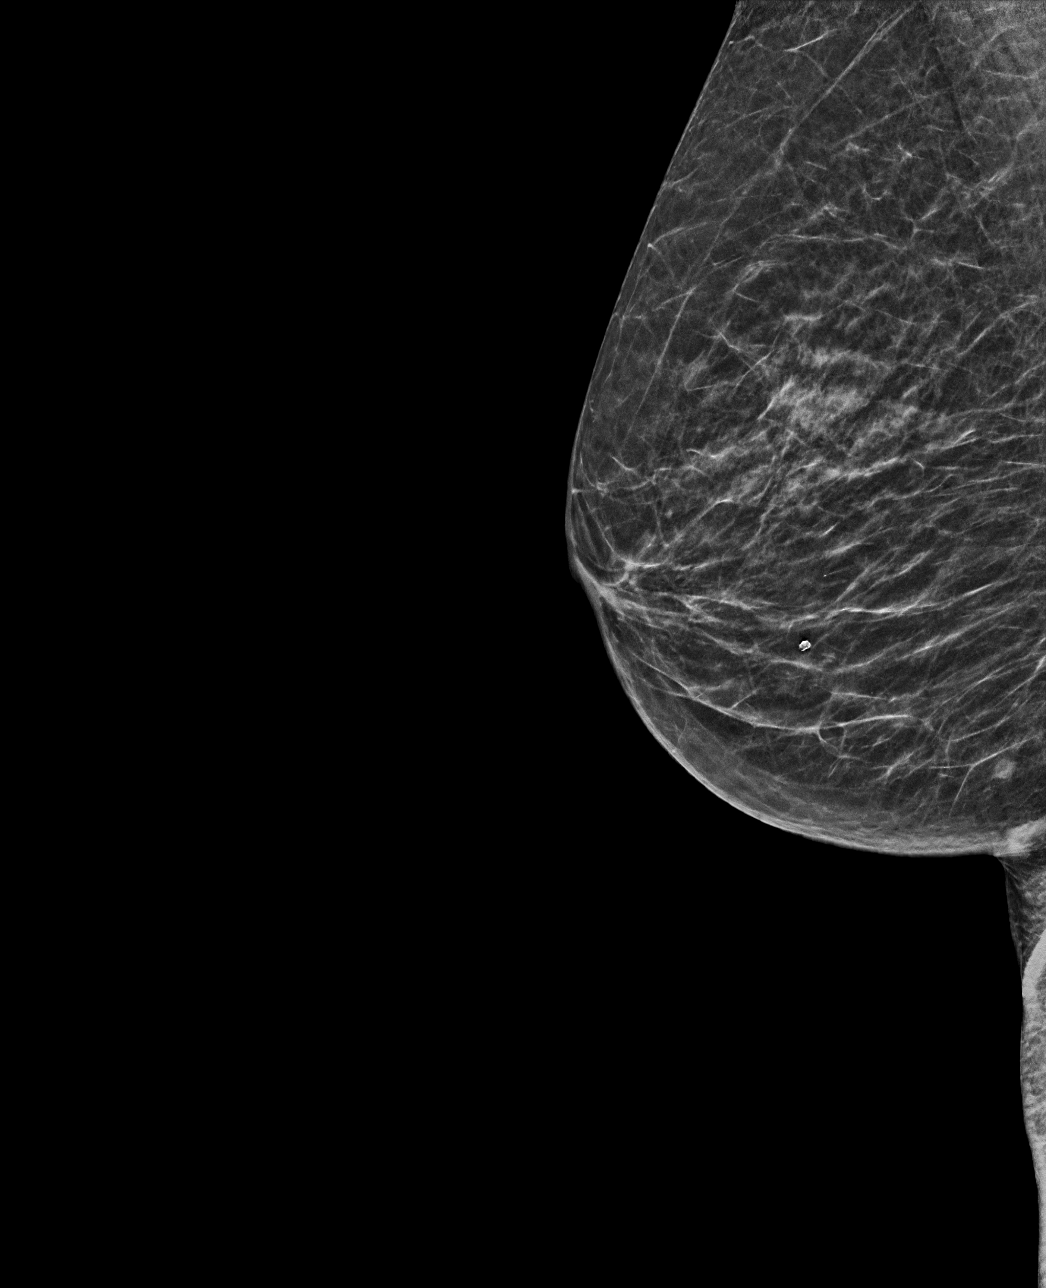

[R CC synth-2D]
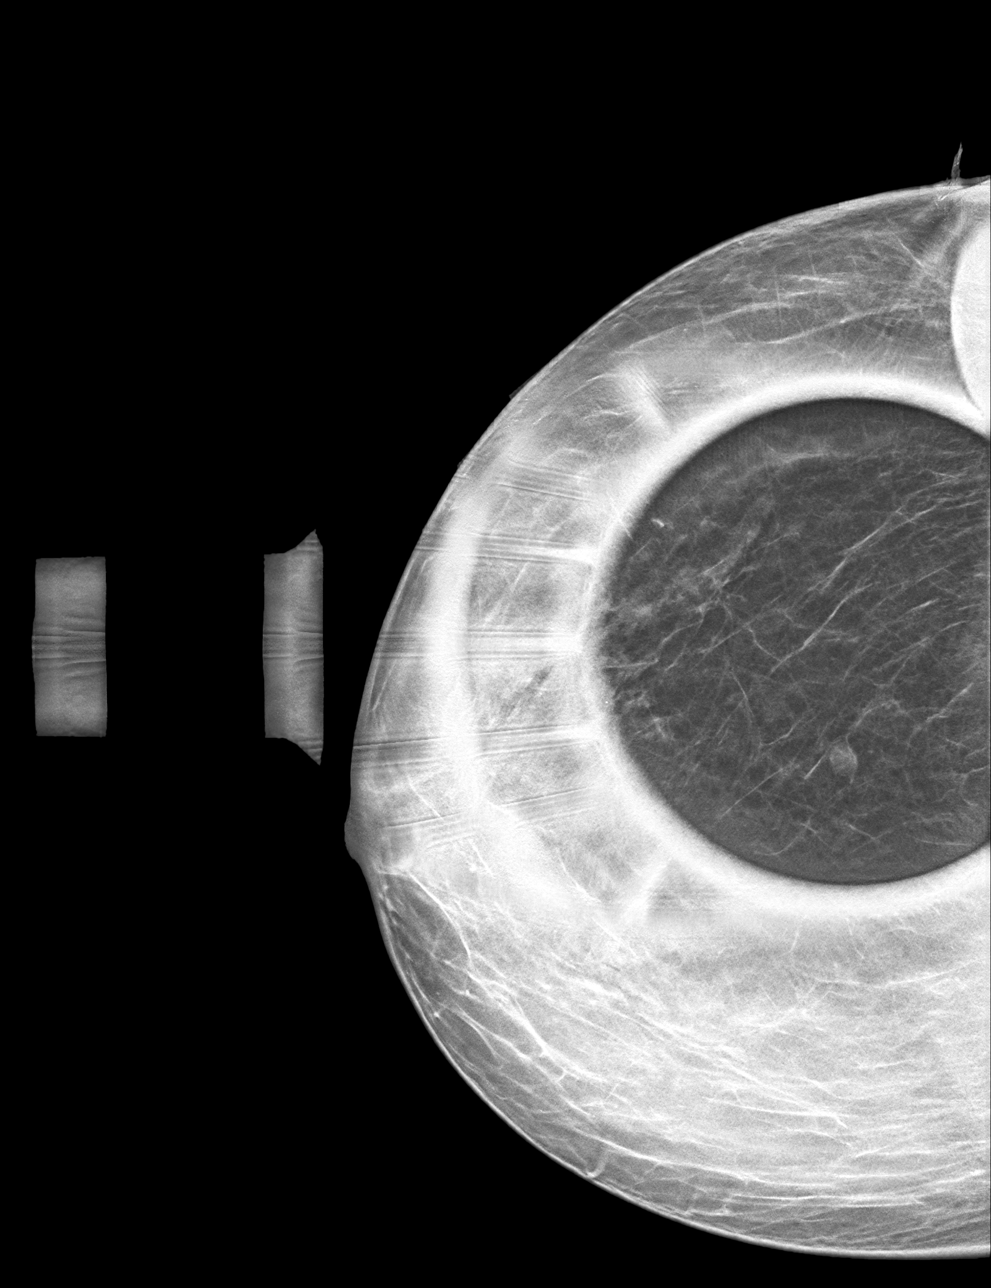

[R ML tomo · tomo slice 23/45.0]
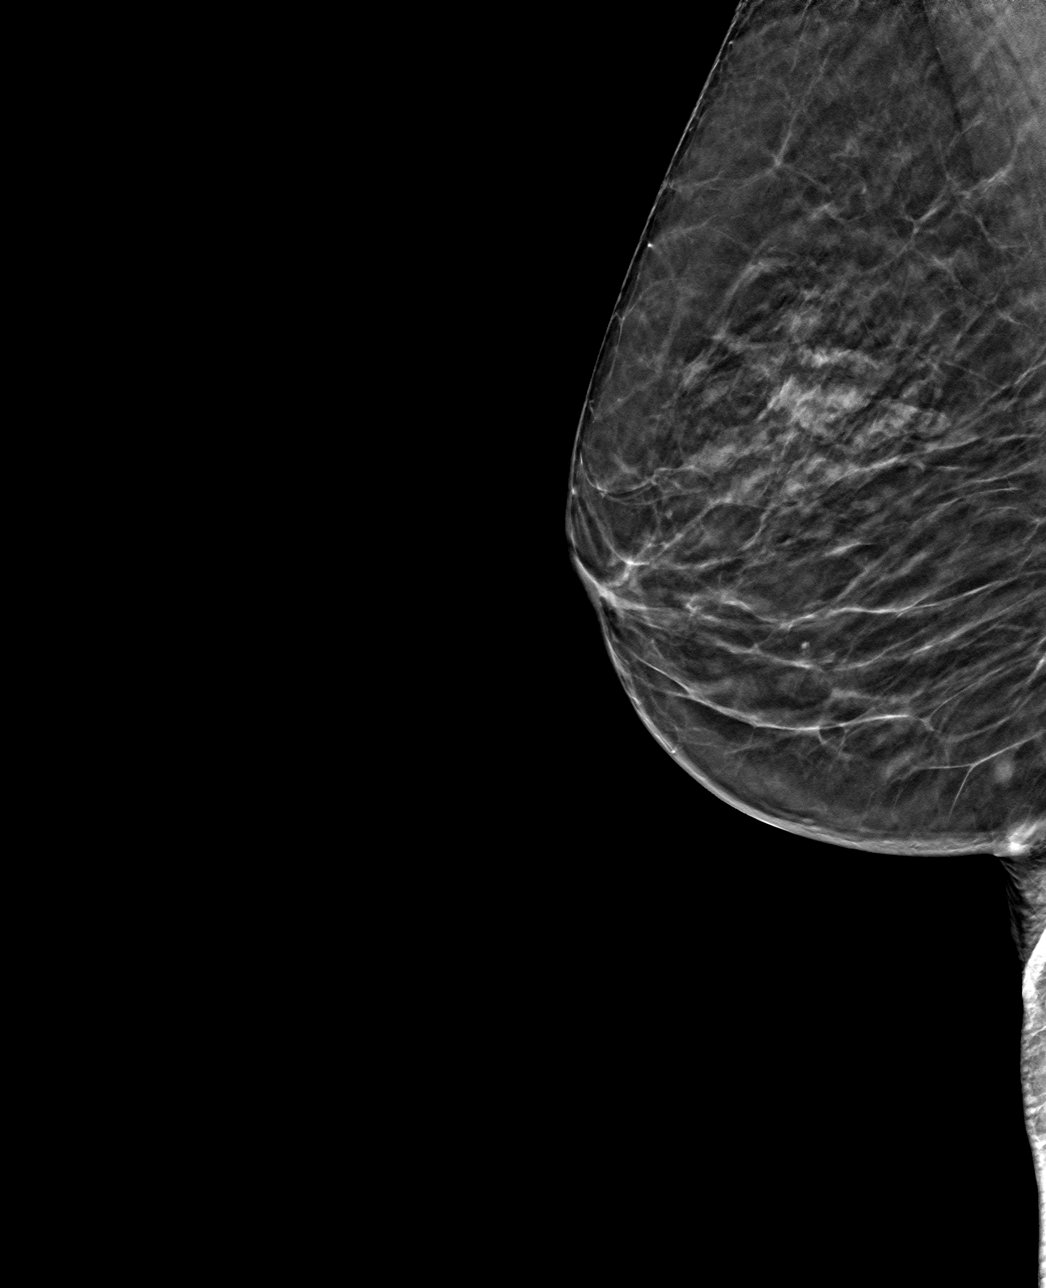

[R CC tomo · tomo slice 19/36.0]
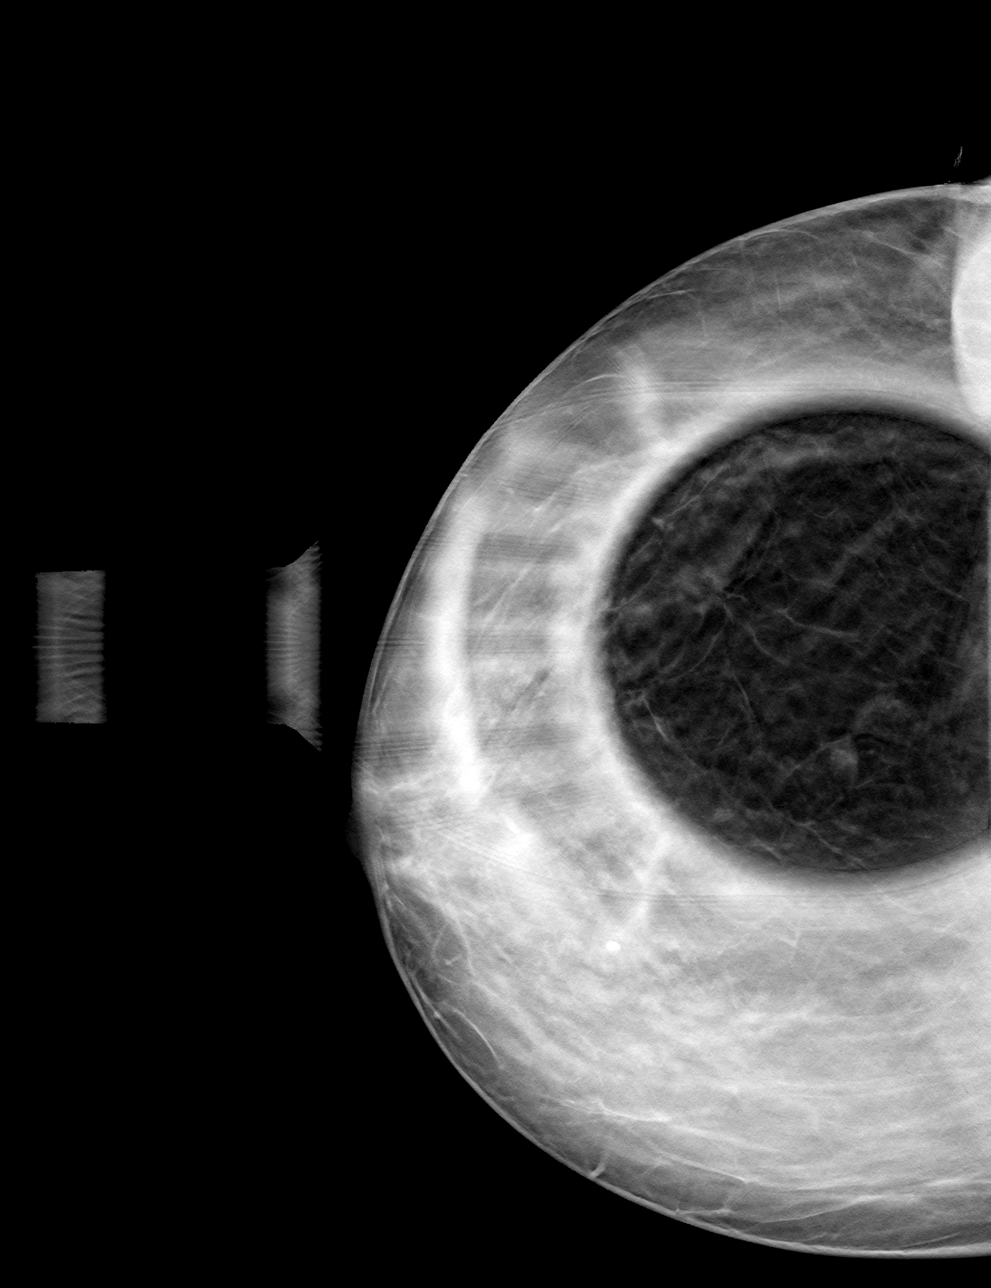

[4 of 12 positions shown; findings below may reference images not displayed]

ACR Breast Density Category b: There are scattered areas of
fibroglandular density.
FINDINGS: Mammogram:

A spot compression tomosynthesis cc view as well as a full field mL
tomosynthesis view of the right breast was performed. There is
persistence of an oval circumscribed mass in the central inferior
aspect of the right breast with possible internal fat measuring
approximately 0.5 cm.

On physical exam there is no obvious bruising or skin changes in the
inferior right breast. Patient does state that she has multiple
animals that jump and crawl on her chest. Additionally she is on a
blood thinner which causes easy bruising.

Ultrasound:

Targeted ultrasound is performed in the right breast at 6 o'clock 8
cm from the nipple demonstrating an oval circumscribed nearly
anechoic mass with surrounding echogenic rim overall measuring 0.5 x
0.5 x 0.5 cm. No internal vascularity. This corresponds to the mass
identified mammographically. Targeted ultrasound of the right axilla
demonstrates normal-appearing lymph nodes.
IMPRESSION: Probably benign right breast mass at 6 o'clock measuring 0.5 cm,
possibly an early oil cyst/fat necrosis.

RECOMMENDATION:
Diagnostic right breast mammogram and ultrasound in 3 months.

I have discussed the findings and recommendations with the patient
who agrees to short-term follow-up. If applicable, a reminder letter
will be sent to the patient regarding the next appointment.

BI-RADS CATEGORY  3: Probably benign.

## 2021-03-27 ENCOUNTER — Ambulatory Visit (INDEPENDENT_AMBULATORY_CARE_PROVIDER_SITE_OTHER): Payer: 59 | Admitting: Dermatology

## 2021-03-27 ENCOUNTER — Other Ambulatory Visit: Payer: Self-pay

## 2021-03-27 DIAGNOSIS — L918 Other hypertrophic disorders of the skin: Secondary | ICD-10-CM

## 2021-03-27 DIAGNOSIS — L820 Inflamed seborrheic keratosis: Secondary | ICD-10-CM

## 2021-03-27 DIAGNOSIS — Z1283 Encounter for screening for malignant neoplasm of skin: Secondary | ICD-10-CM

## 2021-03-27 DIAGNOSIS — L821 Other seborrheic keratosis: Secondary | ICD-10-CM

## 2021-03-27 DIAGNOSIS — L609 Nail disorder, unspecified: Secondary | ICD-10-CM

## 2021-03-27 DIAGNOSIS — L719 Rosacea, unspecified: Secondary | ICD-10-CM | POA: Diagnosis not present

## 2021-03-27 DIAGNOSIS — L578 Other skin changes due to chronic exposure to nonionizing radiation: Secondary | ICD-10-CM | POA: Diagnosis not present

## 2021-03-27 DIAGNOSIS — L814 Other melanin hyperpigmentation: Secondary | ICD-10-CM

## 2021-03-27 DIAGNOSIS — D1800 Hemangioma unspecified site: Secondary | ICD-10-CM

## 2021-03-27 DIAGNOSIS — D229 Melanocytic nevi, unspecified: Secondary | ICD-10-CM

## 2021-03-27 NOTE — Patient Instructions (Signed)

## 2021-03-27 NOTE — Progress Notes (Signed)
Follow-Up Visit   Subjective  Maureen Pena is a 65 y.o. female who presents for the following: Annual Exam (Patient has noticed no new or changing moles, lesions, or spots). The patient presents for Total-Body Skin Exam (TBSE) for skin cancer screening and mole check.  The following portions of the chart were reviewed this encounter and updated as appropriate:   Tobacco  Allergies  Meds  Problems  Med Hx  Surg Hx  Fam Hx     Review of Systems:  No other skin or systemic complaints except as noted in HPI or Assessment and Plan.  Objective  Well appearing patient in no apparent distress; mood and affect are within normal limits.  A full examination was performed including scalp, head, eyes, ears, nose, lips, neck, chest, axillae, abdomen, back, buttocks, bilateral upper extremities, bilateral lower extremities, hands, feet, fingers, toes, fingernails, and toenails. All findings within normal limits unless otherwise noted below.  Face Erythema.  L cheek x 1,  L mid back x 1 (2) Erythematous keratotic or waxy stuck-on papule or plaque.   B/L toenails Thickening of the toenails.   Assessment & Plan  Rosacea Face  Rosacea is a chronic progressive skin condition usually affecting the face of adults, causing redness and/or acne bumps. It is treatable but not curable. It sometimes affects the eyes (ocular rosacea) as well. It may respond to topical and/or systemic medication and can flare with stress, sun exposure, alcohol, exercise and some foods.  Daily application of broad spectrum spf 30+ sunscreen to face is recommended to reduce flares. Discussed tx options and laser.  Pt declines.  Inflamed seborrheic keratosis L cheek x 1,  L mid back x 1  Destruction of lesion - L cheek x 1,  L mid back x 1 Complexity: simple   Destruction method: cryotherapy   Informed consent: discussed and consent obtained   Timeout:  patient name, date of birth, surgical site, and procedure  verified Lesion destroyed using liquid nitrogen: Yes   Region frozen until ice ball extended beyond lesion: Yes   Outcome: patient tolerated procedure well with no complications   Post-procedure details: wound care instructions given    Nail problem B/L toenails  Benign appearing, likely due to trauma.  Skin cancer screening  Lentigines - Scattered tan macules - Due to sun exposure - Benign-appearing, observe - Recommend daily broad spectrum sunscreen SPF 30+ to sun-exposed areas, reapply every 2 hours as needed. - Call for any changes  Seborrheic Keratoses - Stuck-on, waxy, tan-brown papules and/or plaques  - Benign-appearing - Discussed benign etiology and prognosis. - Observe - Call for any changes  Melanocytic Nevi - Tan-brown and/or pink-flesh-colored symmetric macules and papules - Benign appearing on exam today - Observation - Call clinic for new or changing moles - Recommend daily use of broad spectrum spf 30+ sunscreen to sun-exposed areas.   Hemangiomas - Red papules - Discussed benign nature - Observe - Call for any changes  Actinic Damage - Chronic condition, secondary to cumulative UV/sun exposure - diffuse scaly erythematous macules with underlying dyspigmentation - Recommend daily broad spectrum sunscreen SPF 30+ to sun-exposed areas, reapply every 2 hours as needed.  - Staying in the shade or wearing long sleeves, sun glasses (UVA+UVB protection) and wide brim hats (4-inch brim around the entire circumference of the hat) are also recommended for sun protection.  - Call for new or changing lesions.  Acrochordons (Skin Tags) - Fleshy, skin-colored pedunculated papules - Benign appearing.  -  Observe. - If desired, they can be removed with an in office procedure that is not covered by insurance. - Please call the clinic if you notice any new or changing lesions.  Skin cancer screening performed today.  Return in about 1 year (around 03/27/2022) for  TBSE.  Luther Redo, CMA, am acting as scribe for Sarina Ser, MD . Documentation: I have reviewed the above documentation for accuracy and completeness, and I agree with the above.  Sarina Ser, MD

## 2021-03-31 ENCOUNTER — Encounter: Payer: Self-pay | Admitting: Dermatology

## 2021-04-04 ENCOUNTER — Other Ambulatory Visit: Payer: Self-pay

## 2021-04-04 ENCOUNTER — Other Ambulatory Visit (INDEPENDENT_AMBULATORY_CARE_PROVIDER_SITE_OTHER): Payer: 59

## 2021-04-04 DIAGNOSIS — D721 Eosinophilia, unspecified: Secondary | ICD-10-CM

## 2021-04-04 DIAGNOSIS — I2510 Atherosclerotic heart disease of native coronary artery without angina pectoris: Secondary | ICD-10-CM

## 2021-04-04 DIAGNOSIS — E039 Hypothyroidism, unspecified: Secondary | ICD-10-CM | POA: Diagnosis not present

## 2021-04-04 DIAGNOSIS — E785 Hyperlipidemia, unspecified: Secondary | ICD-10-CM

## 2021-04-04 DIAGNOSIS — I2121 ST elevation (STEMI) myocardial infarction involving left circumflex coronary artery: Secondary | ICD-10-CM | POA: Diagnosis not present

## 2021-04-04 LAB — CBC WITH DIFFERENTIAL/PLATELET
Basophils Absolute: 0.1 10*3/uL (ref 0.0–0.1)
Basophils Relative: 1.2 % (ref 0.0–3.0)
Eosinophils Absolute: 0.4 10*3/uL (ref 0.0–0.7)
Eosinophils Relative: 4.2 % (ref 0.0–5.0)
HCT: 45.7 % (ref 36.0–46.0)
Hemoglobin: 15.3 g/dL — ABNORMAL HIGH (ref 12.0–15.0)
Lymphocytes Relative: 26.8 % (ref 12.0–46.0)
Lymphs Abs: 2.6 10*3/uL (ref 0.7–4.0)
MCHC: 33.3 g/dL (ref 30.0–36.0)
MCV: 93.6 fl (ref 78.0–100.0)
Monocytes Absolute: 0.6 10*3/uL (ref 0.1–1.0)
Monocytes Relative: 6 % (ref 3.0–12.0)
Neutro Abs: 6 10*3/uL (ref 1.4–7.7)
Neutrophils Relative %: 61.8 % (ref 43.0–77.0)
Platelets: 201 10*3/uL (ref 150.0–400.0)
RBC: 4.89 Mil/uL (ref 3.87–5.11)
RDW: 14.1 % (ref 11.5–15.5)
WBC: 9.7 10*3/uL (ref 4.0–10.5)

## 2021-04-04 LAB — LIPID PANEL
Cholesterol: 135 mg/dL (ref 0–200)
HDL: 61.4 mg/dL (ref >39.00)
LDL Cholesterol: 62 mg/dL (ref 0–99)
NonHDL: 73.96
Total CHOL/HDL Ratio: 2
Triglycerides: 62 mg/dL (ref 0.0–149.0)
VLDL: 12.4 mg/dL (ref 0.0–40.0)

## 2021-04-04 LAB — COMPREHENSIVE METABOLIC PANEL
ALT: 17 U/L (ref 0–35)
AST: 21 U/L (ref 0–37)
Albumin: 4.2 g/dL (ref 3.5–5.2)
Alkaline Phosphatase: 85 U/L (ref 39–117)
BUN: 18 mg/dL (ref 6–23)
CO2: 26 mEq/L (ref 19–32)
Calcium: 9.6 mg/dL (ref 8.4–10.5)
Chloride: 107 mEq/L (ref 96–112)
Creatinine, Ser: 0.9 mg/dL (ref 0.40–1.20)
GFR: 67.88 mL/min (ref >60.00)
Glucose, Bld: 105 mg/dL — ABNORMAL HIGH (ref 70–99)
Potassium: 5 mEq/L (ref 3.5–5.1)
Sodium: 141 mEq/L (ref 135–145)
Total Bilirubin: 0.5 mg/dL (ref 0.2–1.2)
Total Protein: 6.1 g/dL (ref 6.0–8.3)

## 2021-04-04 LAB — TSH: TSH: 0.78 u[IU]/mL (ref 0.35–5.50)

## 2021-04-14 ENCOUNTER — Other Ambulatory Visit (HOSPITAL_COMMUNITY)
Admission: RE | Admit: 2021-04-14 | Discharge: 2021-04-14 | Disposition: A | Discharge status: Home / Self Care | Payer: 59 | Source: Ambulatory Visit | Attending: Internal Medicine | Admitting: Internal Medicine

## 2021-04-14 ENCOUNTER — Other Ambulatory Visit: Payer: Self-pay

## 2021-04-14 ENCOUNTER — Ambulatory Visit (INDEPENDENT_AMBULATORY_CARE_PROVIDER_SITE_OTHER): Payer: 59 | Admitting: Internal Medicine

## 2021-04-14 ENCOUNTER — Encounter: Payer: Self-pay | Admitting: Internal Medicine

## 2021-04-14 VITALS — BP 104/76 | HR 70 | Temp 98.3°F | Ht 66.5 in | Wt 151.4 lb

## 2021-04-14 DIAGNOSIS — I2510 Atherosclerotic heart disease of native coronary artery without angina pectoris: Secondary | ICD-10-CM

## 2021-04-14 DIAGNOSIS — R739 Hyperglycemia, unspecified: Secondary | ICD-10-CM

## 2021-04-14 DIAGNOSIS — Z1389 Encounter for screening for other disorder: Secondary | ICD-10-CM

## 2021-04-14 DIAGNOSIS — Z124 Encounter for screening for malignant neoplasm of cervix: Secondary | ICD-10-CM

## 2021-04-14 DIAGNOSIS — D751 Secondary polycythemia: Secondary | ICD-10-CM

## 2021-04-14 DIAGNOSIS — E785 Hyperlipidemia, unspecified: Secondary | ICD-10-CM

## 2021-04-14 DIAGNOSIS — E039 Hypothyroidism, unspecified: Secondary | ICD-10-CM

## 2021-04-14 DIAGNOSIS — Z23 Encounter for immunization: Secondary | ICD-10-CM

## 2021-04-14 DIAGNOSIS — F17200 Nicotine dependence, unspecified, uncomplicated: Secondary | ICD-10-CM

## 2021-04-14 MED ORDER — EZETIMIBE 10 MG PO TABS: 10.0000 mg | ORAL_TABLET | Freq: Every day | ORAL | 3 refills | Status: DC

## 2021-04-14 MED ORDER — ATORVASTATIN CALCIUM 10 MG PO TABS: 10.0000 mg | ORAL_TABLET | Freq: Every day | ORAL | 3 refills | Status: DC

## 2021-04-14 MED ORDER — SHINGRIX 50 MCG/0.5ML IM SUSR: 0.5000 mL | Freq: Once | INTRAMUSCULAR | 0 refills | Status: AC

## 2021-04-14 NOTE — Patient Instructions (Addendum)
Mammogram 05/31/2020 screening Think about colonoscopy due 08/26/2021 let me know when ready for referral to Dr. Allen Norris   Zoster Vaccine, Recombinant injection x 2 doses  What is this medication? ZOSTER VACCINE (ZOS ter vak SEEN) is a vaccine used to reduce the risk of getting shingles. This vaccine is not used to treat shingles or nerve pain from shingles. This medicine may be used for other purposes; ask your health care provider or pharmacist if you have questions. COMMON BRAND NAME(S): [facility] What should I tell my care team before I take this medication? They need to know if you have any of these conditions: cancer immune system problems an unusual or allergic reaction to Zoster vaccine, other medications, foods, dyes, or preservatives pregnant or trying to get pregnant breast-feeding How should I use this medication? This vaccine is injected into a muscle. It is given by a health care provider. A copy of Vaccine Information Statements will be given before each vaccination. Be sure to read this information carefully each time. This sheet may change often. Talk to your health care provider about the use of this vaccine in children. This vaccine is not approved for use in children. Overdosage: If you think you have taken too much of this medicine contact a poison control center or emergency room at once. NOTE: This medicine is only for you. Do not share this medicine with others. What if I miss a dose? Keep appointments for follow-up (booster) doses. It is important not to miss your dose. Call your health care provider if you are unable to keep an appointment. What may interact with this medication? medicines that suppress your immune system medicines to treat cancer steroid medicines like prednisone or cortisone This list may not describe all possible interactions. Give your health care provider a list of all the medicines, herbs, non-prescription drugs, or dietary supplements you use.  Also tell them if you smoke, drink alcohol, or use illegal drugs. Some items may interact with your medicine. What should I watch for while using this medication? Visit your health care provider regularly. This vaccine, like all vaccines, may not fully protect everyone. What side effects may I notice from receiving this medication? Side effects that you should report to your doctor or health care professional as soon as possible: allergic reactions (skin rash, itching or hives; swelling of the face, lips, or tongue) trouble breathing Side effects that usually do not require medical attention (report these to your doctor or health care professional if they continue or are bothersome): chills headache fever nausea pain, redness, or irritation at site where injected tiredness vomiting This list may not describe all possible side effects. Call your doctor for medical advice about side effects. You may report side effects to FDA at 1-800-FDA-1088. Where should I keep my medication? This vaccine is only given by a health care provider. It will not be stored at home. NOTE: This sheet is a summary. It may not cover all possible information. If you have questions about this medicine, talk to your doctor, pharmacist, or health care provider.  2022 Elsevier/Gold Standard (2021-02-14 00:00:00)

## 2021-04-14 NOTE — Progress Notes (Signed)
Chief Complaint  Patient presents with   Gynecologic Exam   F/u  1. Pap today  2. Hld/cad on lipitor 10 mg and zetia and lipid controlled 3. Hypothyroidism on levo 100 mcg tsh normal  No complaints but did have occular migraine seeing spots and calodoscope weeks ago 1st time in 10 years will make appt eye md  4. Wants prevnar vaccine today   Review of Systems  Constitutional:  Negative for weight loss.  HENT:  Negative for hearing loss.   Eyes:  Negative for blurred vision.  Respiratory:  Negative for shortness of breath.   Cardiovascular:  Negative for chest pain.  Gastrointestinal:  Negative for abdominal pain.  Musculoskeletal:  Negative for back pain.  Skin:  Negative for rash.  Neurological:  Negative for headaches.  Psychiatric/Behavioral:  Negative for depression.   Past Medical History:  Diagnosis Date   Anxiety    Barrett's esophagus    Depression    Diverticulitis    History of chicken pox    Thyroid disease    hypothyroidism   UTI (urinary tract infection)    Past Surgical History:  Procedure Laterality Date   ESOPHAGOGASTRODUODENOSCOPY (EGD) WITH PROPOFOL N/A 05/02/2018   Procedure: ESOPHAGOGASTRODUODENOSCOPY (EGD) WITH PROPOFOL;  Surgeon: Lucilla Lame, MD;  Location: [place] [facility];  Service: Endoscopy;  Laterality: N/A;   LEFT HEART CATH AND CORONARY ANGIOGRAPHY N/A 08/20/2019   Procedure: LEFT HEART CATH AND CORONARY ANGIOGRAPHY;  Surgeon: Minna Merritts, MD;  Location: [facility];  Service: Cardiovascular;  Laterality: N/A;   TUBAL LIGATION     Family History  Problem Relation Age of Onset   Depression Mother    Anxiety disorder Mother    Schizophrenia Mother    Paranoid behavior Mother    Stroke Mother    COPD Father    Early death Father    Hearing loss Father    Heart disease Father    CAD Father        s/p cabg x 3    Depression Brother    COPD Brother    Hypertension Brother    Kidney disease Brother    Pulmonary fibrosis  Brother        died 14 26-Jul-2020   Breast cancer Neg Hx    Social History   Socioeconomic History   Marital status: Divorced    Spouse name: Not on file   Number of children: 2   Years of education: Not on file   Highest education level: Bachelor's degree (e.g., BA, AB, BS)  Occupational History    Comment: part time  Tobacco Use   Smoking status: Every Day    Packs/day: 0.50    Types: Cigarettes   Smokeless tobacco: Never  Vaping Use   Vaping Use: Never used  Substance and Sexual Activity   Alcohol use: Not Currently    Alcohol/week: 1.0 standard drink    Types: 1 Glasses of wine per week   Drug use: Yes    Types: Marijuana   Sexual activity: Not Currently  Other Topics Concern   Not on file  Social History Narrative   Kids    No guns    Wears seat belt    Safe in relationship    Smoker    Lives with Lexicographer    Social Determinants of Health   Financial Resource Strain: Not on file  Food Insecurity: Not on file  Transportation Needs: Not on file  Physical  Activity: Not on file  Stress: Not on file  Social Connections: Not on file  Intimate Partner Violence: Not on file   Current Meds  Medication Sig   aspirin 81 MG EC tablet Take 1 tablet (81 mg total) by mouth daily.   atorvastatin (LIPITOR) 10 MG tablet Take 1 tablet (10 mg total) by mouth daily at 6 PM.   Cholecalciferol (VITAMIN D-3) 5000 units TABS Take by mouth.   escitalopram (LEXAPRO) 5 MG tablet Take 1 tablet (5 mg total) by mouth daily.   ezetimibe (ZETIA) 10 MG tablet Take 1 tablet (10 mg total) by mouth daily.   levothyroxine (SYNTHROID) 100 MCG tablet Take 1 tablet (100 mcg total) by mouth daily before breakfast. 30 minutes before breakfast 6 days a week   Omega-3 1000 MG CAPS Take 1,000 mg by mouth daily.    Zoster Vaccine Adjuvanted [facility]) injection Inject 0.5 mLs into the muscle once for 1 dose. X 2 doses   No Known Allergies Recent Results (from the past 2160 hour(s))  TSH      Status: None   Collection Time: 04/04/21  7:46 AM  Result Value Ref Range   TSH 0.78 0.35 - 5.50 uIU/mL  CBC with Differential/Platelet     Status: Abnormal   Collection Time: 04/04/21  7:46 AM  Result Value Ref Range   WBC 9.7 4.0 - 10.5 K/uL   RBC 4.89 3.87 - 5.11 Mil/uL   Hemoglobin 15.3 (H) 12.0 - 15.0 g/dL   HCT 45.7 36.0 - 46.0 %   MCV 93.6 78.0 - 100.0 fl   MCHC 33.3 30.0 - 36.0 g/dL   RDW 14.1 11.5 - 15.5 %   Platelets 201.0 150.0 - 400.0 K/uL   Neutrophils Relative % 61.8 43.0 - 77.0 %   Lymphocytes Relative 26.8 12.0 - 46.0 %   Monocytes Relative 6.0 3.0 - 12.0 %   Eosinophils Relative 4.2 0.0 - 5.0 %   Basophils Relative 1.2 0.0 - 3.0 %   Neutro Abs 6.0 1.4 - 7.7 K/uL   Lymphs Abs 2.6 0.7 - 4.0 K/uL   Monocytes Absolute 0.6 0.1 - 1.0 K/uL   Eosinophils Absolute 0.4 0.0 - 0.7 K/uL   Basophils Absolute 0.1 0.0 - 0.1 K/uL  Lipid panel     Status: None   Collection Time: 04/04/21  7:46 AM  Result Value Ref Range   Cholesterol 135 0 - 200 mg/dL    Comment: ATP III Classification       Desirable:  < 200 mg/dL               Borderline High:  200 - 239 mg/dL          High:  > = 240 mg/dL   Triglycerides 62.0 0.0 - 149.0 mg/dL    Comment: Normal:  <150 mg/dLBorderline High:  150 - 199 mg/dL   HDL 61.40 >39.00 mg/dL   VLDL 12.4 0.0 - 40.0 mg/dL   LDL Cholesterol 62 0 - 99 mg/dL   Total CHOL/HDL Ratio 2     Comment:                Men          Women1/2 Average Risk     3.4          3.3Average Risk          5.0          4.42X Average Risk  9.6          7.13X Average Risk          15.0          11.0                       NonHDL 73.96     Comment: NOTE:  Non-HDL goal should be 30 mg/dL higher than patient's LDL goal (i.e. LDL goal of < 70 mg/dL, would have non-HDL goal of < 100 mg/dL)  Comprehensive metabolic panel     Status: Abnormal   Collection Time: 04/04/21  7:46 AM  Result Value Ref Range   Sodium 141 135 - 145 mEq/L   Potassium 5.0 3.5 - 5.1 mEq/L   Chloride  107 96 - 112 mEq/L   CO2 26 19 - 32 mEq/L   Glucose, Bld 105 (H) 70 - 99 mg/dL   BUN 18 6 - 23 mg/dL   Creatinine, Ser 0.90 0.40 - 1.20 mg/dL   Total Bilirubin 0.5 0.2 - 1.2 mg/dL   Alkaline Phosphatase 85 39 - 117 U/L   AST 21 0 - 37 U/L   ALT 17 0 - 35 U/L   Total Protein 6.1 6.0 - 8.3 g/dL   Albumin 4.2 3.5 - 5.2 g/dL   GFR 67.88 >60.00 mL/min    Comment: Calculated using the CKD-EPI Creatinine Equation (2021)   Calcium 9.6 8.4 - 10.5 mg/dL   Objective  Body mass index is 24.07 kg/m. Wt Readings from Last 3 Encounters:  04/14/21 151 lb 6.4 oz (68.7 kg)  11/08/20 151 lb (68.5 kg)  09/13/20 148 lb 6 oz (67.3 kg)   Temp Readings from Last 3 Encounters:  04/14/21 98.3 F (36.8 C) (Oral)  11/08/20 98.5 F (36.9 C) (Oral)  07/08/20 98.4 F (36.9 C) (Oral)   BP Readings from Last 3 Encounters:  04/14/21 104/76  11/08/20 104/70  09/13/20 110/80   Pulse Readings from Last 3 Encounters:  04/14/21 70  11/08/20 71  09/13/20 76    Physical Exam Vitals and nursing note reviewed.  Constitutional:      Appearance: Normal appearance. She is well-developed and well-groomed.  HENT:     Head: Normocephalic and atraumatic.  Eyes:     Conjunctiva/sclera: Conjunctivae normal.     Pupils: Pupils are equal, round, and reactive to light.  Cardiovascular:     Rate and Rhythm: Normal rate and regular rhythm.     Heart sounds: Normal heart sounds. No murmur heard. Abdominal:     Tenderness: There is no abdominal tenderness.  Genitourinary:    Pubic Area: No rash.      Labia:        Right: No rash.        Left: No rash.      Vagina: Normal.     Cervix: Normal.     Uterus: Normal.      Adnexa: Right adnexa normal and left adnexa normal.  Skin:    General: Skin is warm and dry.  Neurological:     General: No focal deficit present.     Mental Status: She is alert and oriented to person, place, and time. Mental status is at baseline.     Gait: Gait normal.  Psychiatric:         Attention and Perception: Attention and perception normal.        Mood and Affect: Mood and affect normal.        Speech:  Speech normal.        Behavior: Behavior normal. Behavior is cooperative.        Thought Content: Thought content normal.        Cognition and Memory: Cognition and memory normal.        Judgment: Judgment normal.    Assessment  Plan  Hypothyroidism, controlled on levo 100 mcg qd   Hyperlipidemia, unspecified hyperlipidemia type  Coronary artery disease involving native coronary artery of native heart without angina pectoris Fu cards lipid controlled cont lipitior 10 and zetia 10   Tobacco use disorder  Cut back rec cessation  HM Needs flu shot - Plan: Flu Vaccine QUAD 6+ mos PF IM (Fluarix Quad PF) Prevnar today L arm pna 23, shingrix has rx  tdap 03/08/20   Hep C neg 03/10/18  Declines MMR and hep B check covid 19  4/4   05/21/19 mammogram negative, ordered   right breast oil cyst 05/31/20 f/u 08/2020 order in due 05/2021   Pap at f/u h/o abnormal x 1  -pap 04/11/18 neg neg HPV due in 04/11/21 Pap done today   Colonoscopy had 08/27/11 and EGD due repeat h/o barretts; EGD with Dr. Allen Norris 05/02/18 no barretts  Pt to let me know when ready colonoscopy referral   rec smoking cessation smoking 10-12 cig/qd no FH lung cancer never smoked more than this    Derm sch 11/18 referred Dr. Nicole Kindred sch 03/27/21    Provider: Dr. Olivia Mackie McLean-Scocuzza-Internal Medicine

## 2021-04-18 LAB — CYTOLOGY - PAP
Comment: NEGATIVE
Diagnosis: NEGATIVE
High risk HPV: NEGATIVE

## 2021-04-26 NOTE — Progress Notes (Signed)
Cardiology Office Note  Date:  04/28/2021   ID:  Maureen Pena, DOB 1956-02-17, MRN [id number]  PCP:  McLean-Scocuzza, Nino Glow, MD   Chief Complaint  Patient presents with   12 month follow up     "Doing well." Medications reviewed by the patient verbally.     HPI:  65 year old woman with history of  smoking,  hyperlipidemia,  strong family history of coronary disease  Late presenting STEMI, initial event August 16, 2019 Severe single-vessel disease proximal to mid left circumflex after OM1 takeoff Collaterals noted  medical management was recommended Who presents for follow up of her CAD  Last office visit with myself April 2022  Fostering german Shephards, 20 dogs in past 15 months Adopted a puppy  Active, walks  Denies angina Still smoking, trying to quit  Labs reviewed  On the lower dose lipitor/zetia,  LDL 57, at goal  Work hard to get her weight down Weight 176 to 148 over the past year Eating well  EKG personally reviewed by myself on todays visit NSR rate 60 bpm, T wave ABN  Other past medical history reviewed Last seen in clinic May 2021 At that time was still smoking, previously tried Chantix Had not tried other modalities Was active  Other past medical history reviewed chest pain August 16, 2019 developed 15 minutes of chest pain radiating through to her back between her scapulas.   did not seek out assistance, Seen by primary care March 10, lab work performed showing elevated D-dimer, elevated troponin  presented to the emergency room, initial EKG concerning for late presenting inferior wall STEMI.     PMH:   has a past medical history of Anxiety, Barrett's esophagus, Depression, Diverticulitis, History of chicken pox, Thyroid disease, and UTI (urinary tract infection).  PSH:    Past Surgical History:  Procedure Laterality Date   ESOPHAGOGASTRODUODENOSCOPY (EGD) WITH PROPOFOL N/A 05/02/2018   Procedure: ESOPHAGOGASTRODUODENOSCOPY (EGD)  WITH PROPOFOL;  Surgeon: Lucilla Lame, MD;  Location: [place];  Service: Endoscopy;  Laterality: N/A;   LEFT HEART CATH AND CORONARY ANGIOGRAPHY N/A 08/20/2019   Procedure: LEFT HEART CATH AND CORONARY ANGIOGRAPHY;  Surgeon: Minna Merritts, MD;  Location: [facility] [place] [facility];  Service: Cardiovascular;  Laterality: N/A;   TUBAL LIGATION      Current Outpatient Medications  Medication Sig Dispense Refill   aspirin 81 MG EC tablet Take 1 tablet (81 mg total) by mouth daily. 90 tablet 3   atorvastatin (LIPITOR) 10 MG tablet Take 1 tablet (10 mg total) by mouth daily at 6 PM. 90 tablet 3   Cholecalciferol (VITAMIN D-3) 5000 units TABS Take by mouth.     escitalopram (LEXAPRO) 5 MG tablet Take 1 tablet (5 mg total) by mouth daily. 90 tablet 1   ezetimibe (ZETIA) 10 MG tablet Take 1 tablet (10 mg total) by mouth daily. 90 tablet 3   levothyroxine (SYNTHROID) 100 MCG tablet Take 1 tablet (100 mcg total) by mouth daily before breakfast. 30 minutes before breakfast 6 days a week 90 tablet 3   nitroGLYCERIN (NITROSTAT) 0.4 MG SL tablet Place 1 tablet (0.4 mg total) under the tongue every 5 (five) minutes x 3 doses as needed for chest pain. 30 tablet 2   Omega-3 1000 MG CAPS Take 1,000 mg by mouth daily.      No current facility-administered medications for this visit.     Allergies:   Patient has no known allergies.   Social History:  The patient  reports that she has been smoking cigarettes. She has been smoking an average of .25 packs per day. She has never used smokeless tobacco. She reports that she does not currently use alcohol after a past usage of about 1.0 standard drink per week. She reports current drug use. Drug: Marijuana.   Family History:   family history includes Anxiety disorder in her mother; CAD in her father; COPD in her brother and father; Depression in her brother and mother; Early death in her father; Hearing loss in her father; Heart disease in her father;  Hypertension in her brother; Kidney disease in her brother; Paranoid behavior in her mother; Pulmonary fibrosis in her brother; Schizophrenia in her mother; Stroke in her mother.    Review of Systems: Review of Systems  Constitutional: Negative.   HENT: Negative.    Respiratory: Negative.    Cardiovascular: Negative.   Gastrointestinal: Negative.   Musculoskeletal: Negative.   Neurological: Negative.   Psychiatric/Behavioral: Negative.    All other systems reviewed and are negative.  PHYSICAL EXAM: VS:  BP 120/80 (BP Location: Left Arm, Patient Position: Sitting, Cuff Size: Normal)   Pulse 60   Ht 5\' 7"  (1.702 m)   Wt 153 lb 6 oz (69.6 kg)   SpO2 98%   BMI 24.02 kg/m  , BMI Body mass index is 24.02 kg/m. Constitutional:  oriented to person, place, and time. No distress.  HENT:  Head: Grossly normal Eyes:  no discharge. No scleral icterus.  Neck: No JVD, no carotid bruits  Cardiovascular: Regular rate and rhythm, no murmurs appreciated Pulmonary/Chest: Clear to auscultation bilaterally, no wheezes or rails Abdominal: Soft.  no distension.  no tenderness.  Musculoskeletal: Normal range of motion Neurological:  normal muscle tone. Coordination normal. No atrophy Skin: Skin warm and dry Psychiatric: normal affect, pleasant  Recent Labs: 04/04/2021: ALT 17; BUN 18; Creatinine, Ser 0.90; Hemoglobin 15.3; Platelets 201.0; Potassium 5.0; Sodium 141; TSH 0.78    Lipid Panel Lab Results  Component Value Date   CHOL 135 04/04/2021   HDL 61.40 04/04/2021   LDLCALC 62 04/04/2021   TRIG 62.0 04/04/2021      Wt Readings from Last 3 Encounters:  04/28/21 153 lb 6 oz (69.6 kg)  04/14/21 151 lb 6.4 oz (68.7 kg)  11/08/20 151 lb (68.5 kg)     ASSESSMENT AND PLAN:  Problem List Items Addressed This Visit   None Visit Diagnoses     Coronary artery disease of native artery of native heart with stable angina pectoris ([place])    -  Primary   Hyperlipidemia LDL goal <70        Nonrheumatic mitral valve regurgitation       Smoker         Coronary disease with stable angina Currently with no symptoms of angina. No further workup at this time. Continue current medication regimen.  Smoker smoking less, trying to quit We have encouraged her to continue to work on weaning her cigarettes and smoking cessation. She will continue to work on this and does not want any assistance with chantix.    Hyperlipidemia Cholesterol is at goal on the current lipid regimen. No changes to the medications were made.  Hypothyroid followed by PMD    Total encounter time more than 25 minutes  Greater than 50% was spent in counseling and coordination of care with the patient    Signed, Esmond Plants, M.D., Ph.D. [place], [place]

## 2021-04-28 ENCOUNTER — Encounter: Payer: Self-pay | Admitting: Cardiovascular Disease

## 2021-04-28 ENCOUNTER — Ambulatory Visit (INDEPENDENT_AMBULATORY_CARE_PROVIDER_SITE_OTHER): Payer: 59 | Admitting: Cardiovascular Disease

## 2021-04-28 ENCOUNTER — Other Ambulatory Visit: Payer: Self-pay

## 2021-04-28 VITALS — BP 120/80 | HR 60 | Ht 67.0 in | Wt 153.4 lb

## 2021-04-28 DIAGNOSIS — I340 Nonrheumatic mitral (valve) insufficiency: Secondary | ICD-10-CM | POA: Diagnosis not present

## 2021-04-28 DIAGNOSIS — I25118 Atherosclerotic heart disease of native coronary artery with other forms of angina pectoris: Secondary | ICD-10-CM

## 2021-04-28 DIAGNOSIS — E785 Hyperlipidemia, unspecified: Secondary | ICD-10-CM | POA: Diagnosis not present

## 2021-04-28 DIAGNOSIS — F17200 Nicotine dependence, unspecified, uncomplicated: Secondary | ICD-10-CM | POA: Diagnosis not present

## 2021-04-28 NOTE — Patient Instructions (Addendum)
Medication Instructions:  No changes  If you need a refill on your cardiac medications before your next appointment, please call your pharmacy.   Lab work: No new labs needed  Testing/Procedures: No new testing needed  Follow-Up: At [facility], you and your health needs are our priority.  As part of our continuing mission to provide you with exceptional heart care, we have created designated Provider Care Teams.  These Care Teams include your primary Cardiologist (physician) and Advanced Practice Providers (APPs -  Physician Assistants and Nurse Practitioners) who all work together to provide you with the care you need, when you need it.  You will need a follow up appointment in 12 months  Providers on your designated Care Team:   Christopher Berge, NP Ryan Dunn, PA-C Cadence Furth, PA-C  COVID-19 Vaccine Information can be found at: [email or web address] For questions related to vaccine distribution or appointments, please email [email or web address] or call [phone].   

## 2021-05-15 ENCOUNTER — Ambulatory Visit: Payer: 59 | Admitting: Cardiovascular Disease

## 2021-05-17 ENCOUNTER — Other Ambulatory Visit: Payer: Self-pay

## 2021-05-17 ENCOUNTER — Encounter: Payer: Self-pay | Admitting: Psychiatry

## 2021-05-17 ENCOUNTER — Telehealth (INDEPENDENT_AMBULATORY_CARE_PROVIDER_SITE_OTHER): Payer: 59 | Admitting: Psychiatry

## 2021-05-17 DIAGNOSIS — F17200 Nicotine dependence, unspecified, uncomplicated: Secondary | ICD-10-CM | POA: Diagnosis not present

## 2021-05-17 DIAGNOSIS — F3342 Major depressive disorder, recurrent, in full remission: Secondary | ICD-10-CM

## 2021-05-17 MED ORDER — ESCITALOPRAM OXALATE 5 MG PO TABS: 5.0000 mg | ORAL_TABLET | Freq: Every day | ORAL | 1 refills | Status: DC

## 2021-05-17 NOTE — Progress Notes (Signed)
Virtual Visit via Video Note  I connected with Maureen Pena on 05/17/21 at 10:00 AM EST by a video enabled telemedicine application and verified that I am speaking with the correct person using two identifiers. Location Provider Location : [facility] Patient Location : Home  Participants: Patient , Provider    I discussed the limitations of evaluation and management by telemedicine and the availability of in person appointments. The patient expressed understanding and agreed to proceed.   I discussed the assessment and treatment plan with the patient. The patient was provided an opportunity to ask questions and all were answered. The patient agreed with the plan and demonstrated an understanding of the instructions.   The patient was advised to call back or seek an in-person evaluation if the symptoms worsen or if the condition fails to improve as anticipated.   [facility] MD OP Progress Note  05/18/2021 8:07 AM Kanita Delage  MRN:  [id number]  Chief Complaint:  Chief Complaint   Follow-up; Anxiety; Depression    HPI: Maureen Pena is a 65 year old Caucasian female, employed, divorced, lives in [place], has a history of depression, tobacco use disorder, hypothyroidism was evaluated by telemedicine today.  Patient today reports she had a good Thanksgiving holiday.  She was able to spend it with her daughter.  Patient reports she continues to enjoy continue time with her pets.  She adopted a new [place] and enjoys him a lot.  Patient however reports she does get bored at times and is interested in finding a volunteer work or something else to occupy herself.  Patient reports she continues to take the Lexapro 5 mg.  Currently denies any sadness, hopelessness.  Denies any lack of motivation or anhedonia.  Sleep is currently good.  She does wake up at around 3 AM however is able to fall back asleep immediately.  Currently is not on a sleep aid.  Patient denies  suicidality, homicidality or perceptual disturbances.  Currently trying to cut back on smoking cigarettes.  Is not interested in using nicotine replacement since she would like to stop using any nicotine and would like to be completely off if possible.  Patient denies any other concerns today.  Visit Diagnosis:    ICD-10-CM   1. MDD (major depressive disorder), recurrent, in full remission ([place])  F33.42 escitalopram (LEXAPRO) 5 MG tablet    2. Tobacco use disorder  F17.200       Past Psychiatric History: Reviewed past psychiatric history from progress note on 02/06/2018.  Past trials of Lexapro, hydroxyzine.  Past Medical History:  Past Medical History:  Diagnosis Date   Anxiety    Barrett's esophagus    Depression    Diverticulitis    History of chicken pox    Thyroid disease    hypothyroidism   UTI (urinary tract infection)     Past Surgical History:  Procedure Laterality Date   ESOPHAGOGASTRODUODENOSCOPY (EGD) WITH PROPOFOL N/A 05/02/2018   Procedure: ESOPHAGOGASTRODUODENOSCOPY (EGD) WITH PROPOFOL;  Surgeon: Lucilla Lame, MD;  Location: [facility];  Service: Endoscopy;  Laterality: N/A;   LEFT HEART CATH AND CORONARY ANGIOGRAPHY N/A 08/20/2019   Procedure: LEFT HEART CATH AND CORONARY ANGIOGRAPHY;  Surgeon: Minna Merritts, MD;  Location: [facility];  Service: Cardiovascular;  Laterality: N/A;   TUBAL LIGATION      Family Psychiatric History: Reviewed family psychiatric history from progress note on 02/06/2018.  Family History:  Family History  Problem Relation Age of Onset   Depression Mother  Anxiety disorder Mother    Schizophrenia Mother    Paranoid behavior Mother    Stroke Mother    COPD Father    Early death Father    Hearing loss Father    Heart disease Father    CAD Father        s/p cabg x 3    Depression Brother    COPD Brother    Hypertension Brother    Kidney disease Brother    Pulmonary fibrosis Brother        died 26  21-Aug-2020   Breast cancer Neg Hx     Social History: Reviewed social history from progress note on 02/06/2018. Social History   Socioeconomic History   Marital status: Divorced    Spouse name: Not on file   Number of children: 2   Years of education: Not on file   Highest education level: Bachelor's degree (e.g., BA, AB, BS)  Occupational History    Comment: part time  Tobacco Use   Smoking status: Every Day    Packs/day: 0.25    Types: Cigarettes   Smokeless tobacco: Never  Vaping Use   Vaping Use: Never used  Substance and Sexual Activity   Alcohol use: Not Currently    Alcohol/week: 1.0 standard drink    Types: 1 Glasses of wine per week   Drug use: Yes    Types: Marijuana   Sexual activity: Not Currently  Other Topics Concern   Not on file  Social History Narrative   Kids    No guns    Wears seat belt    Safe in relationship    Smoker    Lives with Lexicographer    Social Determinants of Health   Financial Resource Strain: Not on file  Food Insecurity: Not on file  Transportation Needs: Not on file  Physical Activity: Not on file  Stress: Not on file  Social Connections: Not on file    Allergies: No Known Allergies  Metabolic Disorder Labs: No results found for: HGBA1C, MPG No results found for: PROLACTIN Lab Results  Component Value Date   CHOL 135 04/04/2021   TRIG 62.0 04/04/2021   HDL 61.40 04/04/2021   CHOLHDL 2 04/04/2021   VLDL 12.4 04/04/2021   LDLCALC 62 04/04/2021   LDLCALC 57 07/29/2020   Lab Results  Component Value Date   TSH 0.78 04/04/2021   TSH 0.78 10/04/2020    Therapeutic Level Labs: No results found for: LITHIUM No results found for: VALPROATE No components found for:  CBMZ  Current Medications: Current Outpatient Medications  Medication Sig Dispense Refill   aspirin 81 MG EC tablet Take 1 tablet (81 mg total) by mouth daily. 90 tablet 3   atorvastatin (LIPITOR) 10 MG tablet Take 1 tablet (10 mg total) by mouth daily  at 6 PM. 90 tablet 3   Cholecalciferol (VITAMIN D-3) 5000 units TABS Take by mouth.     escitalopram (LEXAPRO) 5 MG tablet Take 1 tablet (5 mg total) by mouth daily. 90 tablet 1   ezetimibe (ZETIA) 10 MG tablet Take 1 tablet (10 mg total) by mouth daily. 90 tablet 3   levothyroxine (SYNTHROID) 100 MCG tablet Take 1 tablet (100 mcg total) by mouth daily before breakfast. 30 minutes before breakfast 6 days a week 90 tablet 3   nitroGLYCERIN (NITROSTAT) 0.4 MG SL tablet Place 1 tablet (0.4 mg total) under the tongue every 5 (five) minutes x 3 doses as needed for  chest pain. 30 tablet 2   Omega-3 1000 MG CAPS Take 1,000 mg by mouth daily.      No current facility-administered medications for this visit.     Musculoskeletal: Strength & Muscle Tone:  UTA Gait & Station:  Seated Patient leans: N/A  Psychiatric Specialty Exam: Review of Systems  Psychiatric/Behavioral:  Negative for agitation, behavioral problems, confusion, decreased concentration, dysphoric mood, hallucinations, self-injury, sleep disturbance and suicidal ideas. The patient is not nervous/anxious and is not hyperactive.   All other systems reviewed and are negative.  There were no vitals taken for this visit.There is no height or weight on file to calculate BMI.  General Appearance: Casual  Eye Contact:  Fair  Speech:  Clear and Coherent  Volume:  Normal  Mood:  Euthymic  Affect:  Congruent  Thought Process:  Goal Directed and Descriptions of Associations: Intact  Orientation:  Full (Time, Place, and Person)  Thought Content: Logical   Suicidal Thoughts:  No  Homicidal Thoughts:  No  Memory:  Immediate;   Fair Recent;   Fair Remote;   Fair  Judgement:  Fair  Insight:  Fair  Psychomotor Activity:  Normal  Concentration:  Concentration: Fair and Attention Span: Fair  Recall:  [employer] of Knowledge: Fair  Language: Fair  Akathisia:  No  Handed:  Right  AIMS (if indicated): done, 0  Assets:  Communication  Skills Desire for Improvement Housing Social Support  ADL's:  Intact  Cognition: WNL  Sleep:  Fair   Screenings: GAD-7    Flowsheet Row Video Visit from 11/17/2020 in [facility] Office Visit from 12/24/2019 in [facility] Video Visit from 08/27/2019 in [facility] Office Visit from 08/19/2019 in [facility] Office Visit from 02/17/2018 in [facility]  Total GAD-7 Score 1 0 0 0 9      PHQ2-9    Flowsheet Row Video Visit from 05/17/2021 in [facility] Visit from 04/14/2021 in [facility] Video Visit from 11/17/2020 in [facility] Visit from 11/08/2020 in [facility] Office Visit from 07/08/2020 in [facility]  PHQ-2 Total Score 0 0 1 0 0  PHQ-9 Total Score 1 0 -- 0 0        Assessment and Plan: Latona Krichbaum is a 65 year old Caucasian female, divorced, lives in [place], has a history of depression, GERD, anxiety, hypothyroidism was evaluated by telemedicine today.  Patient currently with stable mood as well as sleep, compliant on Lexapro low dose.  Discussed plan as noted below.  Plan MDD in remission Lexapro 5 mg p.o. daily  Tobacco use disorder-improving Provided counseling for 2 minutes. Patient is currently cutting back  Follow-up in clinic in 6 months or sooner in person.  This note was generated in part or whole with voice recognition software. Voice recognition is usually quite accurate but there are transcription errors that can and very often do occur. I apologize for any typographical errors that were not detected and corrected.       Ursula Alert, MD 05/18/2021, 8:07 AM

## 2021-05-19 ENCOUNTER — Other Ambulatory Visit: Payer: Self-pay | Admitting: Psychiatry

## 2021-05-19 DIAGNOSIS — F3342 Major depressive disorder, recurrent, in full remission: Secondary | ICD-10-CM

## 2021-05-29 IMAGING — US US BREAST*R* LIMITED INC AXILLA
1 series · 4 of 4 positions shown · non-contrast
Comparison: Previous exam(s).

CLINICAL DATA: Follow-up probably benign mass in the 6 o'clock
position of the right breast.

EXAM:
DIGITAL DIAGNOSTIC UNILATERAL RIGHT MAMMOGRAM WITH TOMOSYNTHESIS AND
CAD; ULTRASOUND RIGHT BREAST LIMITED
TECHNIQUE: Right digital diagnostic mammography and breast tomosynthesis was
performed. The images were evaluated with computer-aided detection.;
Targeted ultrasound examination of the right breast was performed

[Series 1: us breast*right* limited inc axilla · 0.04mm/px · 4 of 4 slices shown]
[im 1/4]
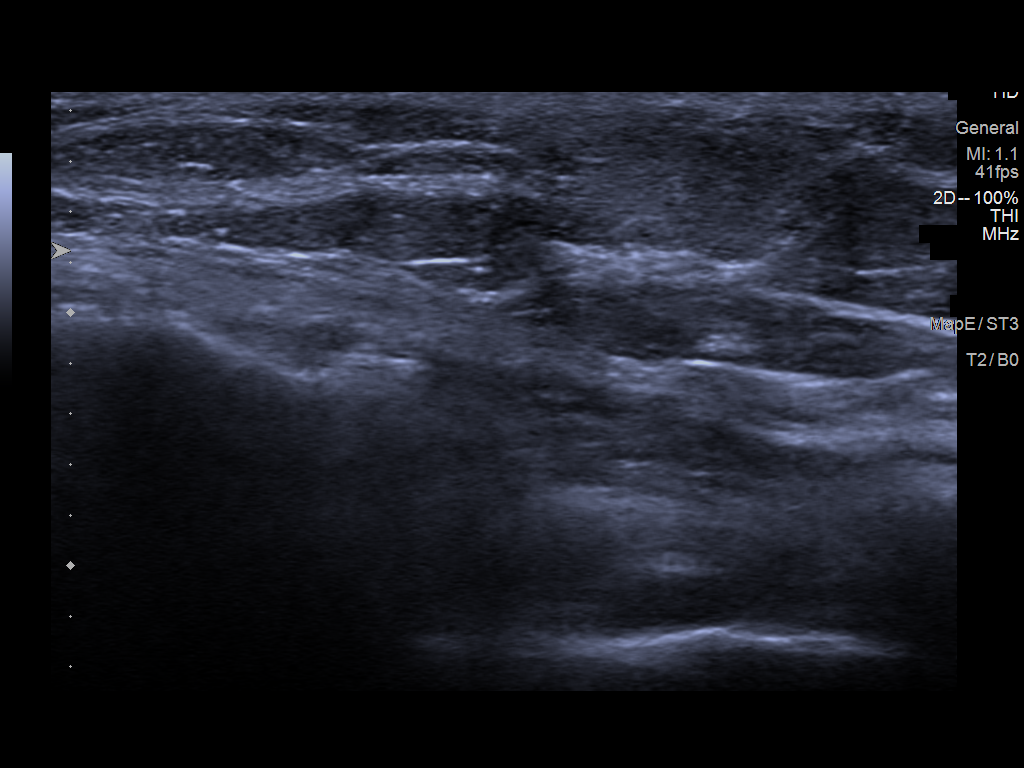
[im 2/4]
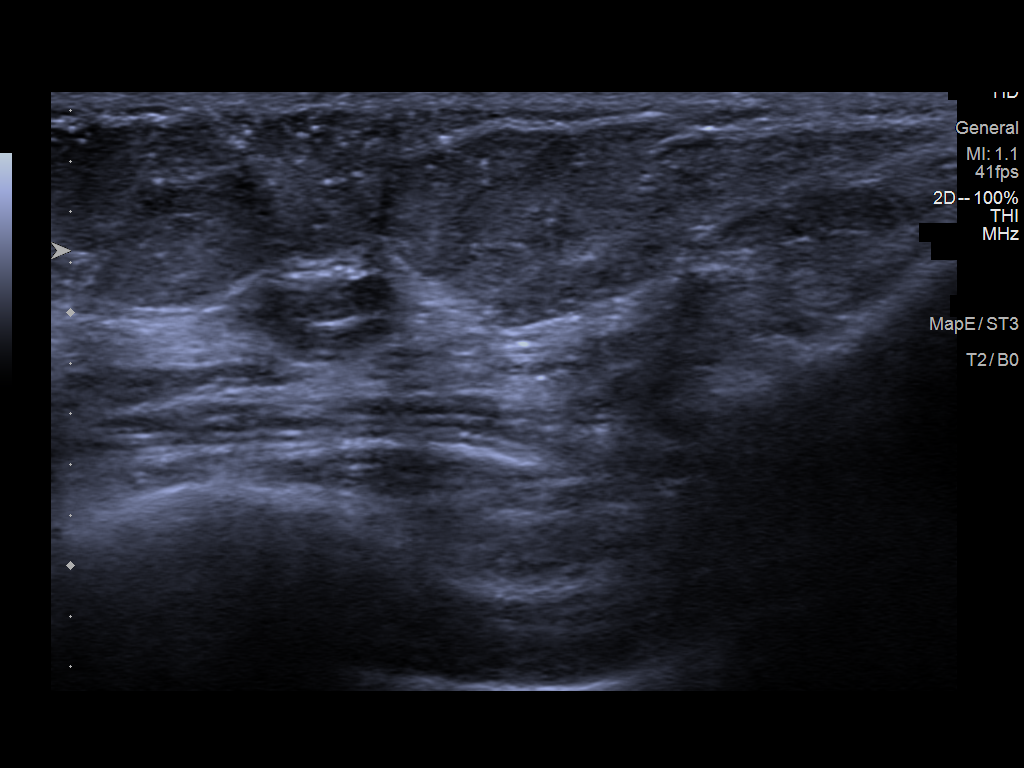
[im 3/4]
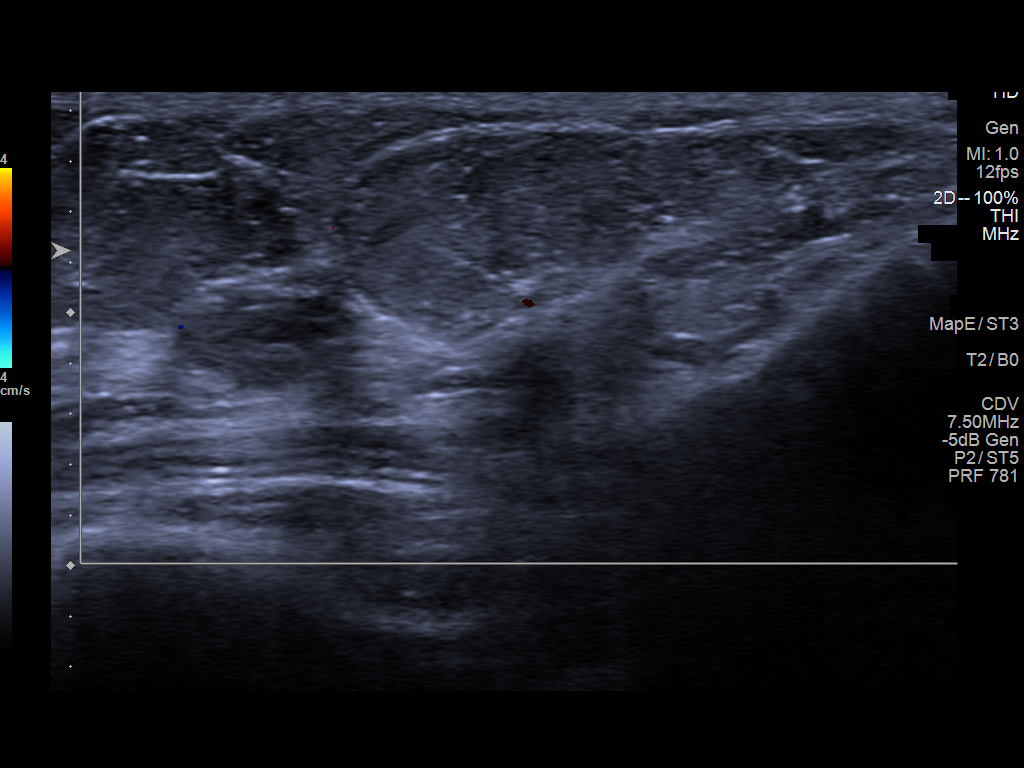
[im 4/4]
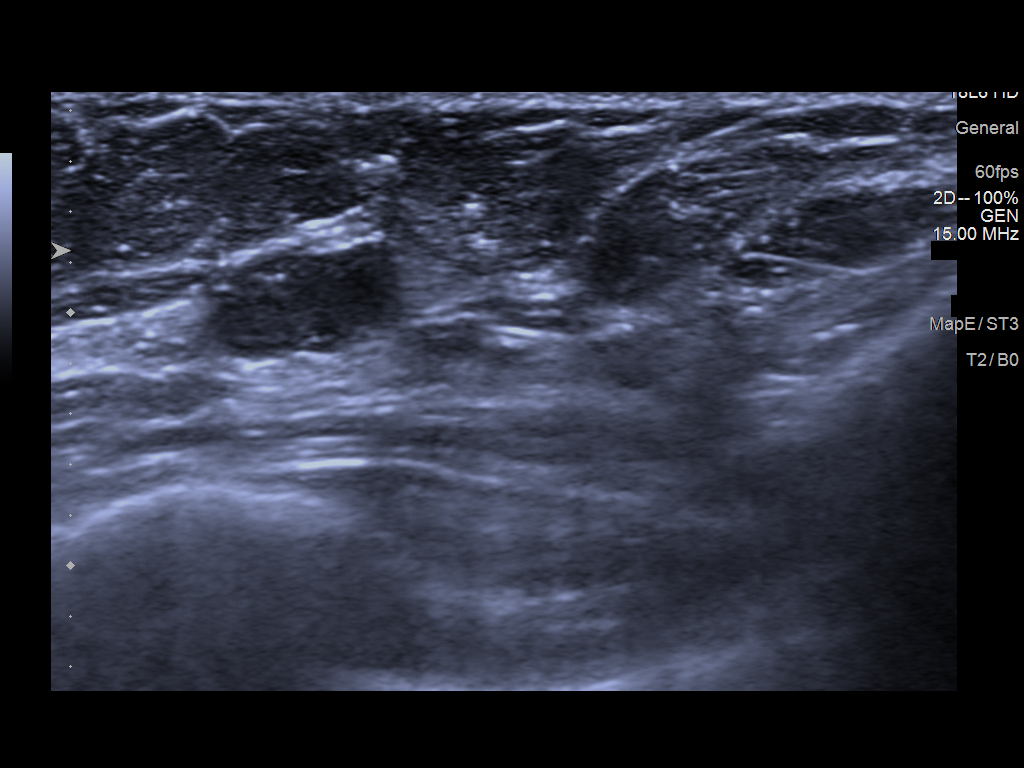

[4 of 4 positions shown; findings below may reference images not displayed]

ACR Breast Density Category b: There are scattered areas of
fibroglandular density.
FINDINGS: The previously demonstrated right breast mass is no longer
visualized.

Targeted ultrasound is performed, showing normal appearing breast
tissue in the 6 o'clock position of the right breast, 8 cm from the
nipple, at the location of the previously demonstrated mass in the
subcutaneous fat.
IMPRESSION: Resolved right breast fat necrosis.  No evidence of malignancy.

RECOMMENDATION:
Bilateral screening mammogram in 9 months when due. That will be 1
year since mammographic evaluation of the left breast.

I have discussed the findings and recommendations with the patient.
If applicable, a reminder letter will be sent to the patient
regarding the next appointment.

BI-RADS CATEGORY  1: Negative.

## 2021-05-30 ENCOUNTER — Ambulatory Visit
Admission: RE | Admit: 2021-05-30 | Discharge: 2021-05-30 | Disposition: A | Discharge status: Home / Self Care | Payer: 59 | Source: Ambulatory Visit | Attending: Internal Medicine | Admitting: Internal Medicine

## 2021-05-30 ENCOUNTER — Other Ambulatory Visit: Payer: Self-pay

## 2021-05-30 DIAGNOSIS — Z1231 Encounter for screening mammogram for malignant neoplasm of breast: Secondary | ICD-10-CM

## 2021-07-27 ENCOUNTER — Telehealth: Payer: Self-pay | Admitting: Cardiovascular Disease

## 2021-07-27 DIAGNOSIS — I2510 Atherosclerotic heart disease of native coronary artery without angina pectoris: Secondary | ICD-10-CM

## 2021-07-27 DIAGNOSIS — E785 Hyperlipidemia, unspecified: Secondary | ICD-10-CM

## 2021-07-27 MED ORDER — ATORVASTATIN CALCIUM 10 MG PO TABS: 10.0000 mg | ORAL_TABLET | Freq: Every day | ORAL | 1 refills | Status: DC

## 2021-07-27 NOTE — Telephone Encounter (Signed)
Requested Prescriptions   Signed Prescriptions Disp Refills   atorvastatin (LIPITOR) 10 MG tablet 90 tablet 1    Sig: Take 1 tablet (10 mg total) by mouth daily at 6 PM.    Authorizing Provider: Minna Merritts    Ordering User: Raelene Bott, Sihaam Chrobak L

## 2021-07-27 NOTE — Telephone Encounter (Signed)
°*  STAT* If patient is at the pharmacy, call can be transferred to refill team.   1. Which medications need to be refilled? (please list name of each medication and dose if known) Atorvastatin 10 mg po qhs   2. Which pharmacy/location (including street and city if local pharmacy) is medication to be sent to?[facility] [id number] - [address], [place]   3. Do they need a 30 day or 90 day supply? [place]

## 2021-08-18 ENCOUNTER — Other Ambulatory Visit: Payer: Self-pay | Admitting: Internal Medicine

## 2021-08-18 DIAGNOSIS — E039 Hypothyroidism, unspecified: Secondary | ICD-10-CM

## 2021-10-12 ENCOUNTER — Other Ambulatory Visit (INDEPENDENT_AMBULATORY_CARE_PROVIDER_SITE_OTHER): Payer: Commercial Managed Care - HMO

## 2021-10-12 DIAGNOSIS — E785 Hyperlipidemia, unspecified: Secondary | ICD-10-CM

## 2021-10-12 DIAGNOSIS — R739 Hyperglycemia, unspecified: Secondary | ICD-10-CM

## 2021-10-12 DIAGNOSIS — Z1389 Encounter for screening for other disorder: Secondary | ICD-10-CM | POA: Diagnosis not present

## 2021-10-12 DIAGNOSIS — D751 Secondary polycythemia: Secondary | ICD-10-CM | POA: Diagnosis not present

## 2021-10-12 DIAGNOSIS — E039 Hypothyroidism, unspecified: Secondary | ICD-10-CM | POA: Diagnosis not present

## 2021-10-12 DIAGNOSIS — I2510 Atherosclerotic heart disease of native coronary artery without angina pectoris: Secondary | ICD-10-CM

## 2021-10-12 LAB — COMPREHENSIVE METABOLIC PANEL
ALT: 20 U/L (ref 0–35)
AST: 20 U/L (ref 0–37)
Albumin: 4.1 g/dL (ref 3.5–5.2)
Alkaline Phosphatase: 92 U/L (ref 39–117)
BUN: 19 mg/dL (ref 6–23)
CO2: 28 mEq/L (ref 19–32)
Calcium: 9.3 mg/dL (ref 8.4–10.5)
Chloride: 106 mEq/L (ref 96–112)
Creatinine, Ser: 0.92 mg/dL (ref 0.40–1.20)
GFR: 65.87 mL/min (ref >60.00)
Glucose, Bld: 102 mg/dL — ABNORMAL HIGH (ref 70–99)
Potassium: 4.8 mEq/L (ref 3.5–5.1)
Sodium: 141 mEq/L (ref 135–145)
Total Bilirubin: 0.5 mg/dL (ref 0.2–1.2)
Total Protein: 6.2 g/dL (ref 6.0–8.3)

## 2021-10-12 LAB — LIPID PANEL
Cholesterol: 153 mg/dL (ref 0–200)
HDL: 66.8 mg/dL (ref >39.00)
LDL Cholesterol: 66 mg/dL (ref 0–99)
NonHDL: 86.58
Total CHOL/HDL Ratio: 2
Triglycerides: 103 mg/dL (ref 0.0–149.0)
VLDL: 20.6 mg/dL (ref 0.0–40.0)

## 2021-10-12 LAB — CBC WITH DIFFERENTIAL/PLATELET
Basophils Absolute: 0.1 10*3/uL (ref 0.0–0.1)
Basophils Relative: 0.8 % (ref 0.0–3.0)
Eosinophils Absolute: 0.3 10*3/uL (ref 0.0–0.7)
Eosinophils Relative: 4.6 % (ref 0.0–5.0)
HCT: 45.6 % (ref 36.0–46.0)
Hemoglobin: 15.1 g/dL — ABNORMAL HIGH (ref 12.0–15.0)
Lymphocytes Relative: 28.3 % (ref 12.0–46.0)
Lymphs Abs: 2 10*3/uL (ref 0.7–4.0)
MCHC: 33.1 g/dL (ref 30.0–36.0)
MCV: 93.4 fl (ref 78.0–100.0)
Monocytes Absolute: 0.5 10*3/uL (ref 0.1–1.0)
Monocytes Relative: 7.2 % (ref 3.0–12.0)
Neutro Abs: 4.2 10*3/uL (ref 1.4–7.7)
Neutrophils Relative %: 59.1 % (ref 43.0–77.0)
Platelets: 203 10*3/uL (ref 150.0–400.0)
RBC: 4.88 Mil/uL (ref 3.87–5.11)
RDW: 14.7 % (ref 11.5–15.5)
WBC: 7.2 10*3/uL (ref 4.0–10.5)

## 2021-10-12 LAB — TSH: TSH: 2.26 u[IU]/mL (ref 0.35–5.50)

## 2021-10-12 LAB — HEMOGLOBIN A1C: Hgb A1c MFr Bld: 5.4 % (ref 4.6–6.5)

## 2021-10-13 LAB — URINALYSIS, ROUTINE W REFLEX MICROSCOPIC
Bilirubin Urine: NEGATIVE
Glucose, UA: NEGATIVE
Hgb urine dipstick: NEGATIVE
Ketones, ur: NEGATIVE
Leukocytes,Ua: NEGATIVE
Nitrite: NEGATIVE
Protein, ur: NEGATIVE
Specific Gravity, Urine: 1.018 (ref 1.001–1.035)
pH: 6 (ref 5.0–8.0)

## 2021-11-15 ENCOUNTER — Ambulatory Visit (INDEPENDENT_AMBULATORY_CARE_PROVIDER_SITE_OTHER): Payer: Commercial Managed Care - HMO | Admitting: Psychiatry

## 2021-11-15 ENCOUNTER — Encounter: Payer: Self-pay | Admitting: Psychiatry

## 2021-11-15 VITALS — BP 128/79 | HR 62 | Temp 98.3°F | Wt 156.8 lb

## 2021-11-15 DIAGNOSIS — F17200 Nicotine dependence, unspecified, uncomplicated: Secondary | ICD-10-CM | POA: Diagnosis not present

## 2021-11-15 DIAGNOSIS — G4700 Insomnia, unspecified: Secondary | ICD-10-CM

## 2021-11-15 DIAGNOSIS — F3342 Major depressive disorder, recurrent, in full remission: Secondary | ICD-10-CM | POA: Diagnosis not present

## 2021-11-15 MED ORDER — ESCITALOPRAM OXALATE 5 MG PO TABS
5.0000 mg | ORAL_TABLET | Freq: Every day | ORAL | 1 refills | Status: DC
Start: 2021-11-15 — End: 2021-11-20

## 2021-11-15 NOTE — Progress Notes (Signed)
[name] OP Progress Note  11/15/2021 4:53 PM Maureen Pena  MRN:  [id number]  Chief Complaint:  Chief Complaint  Patient presents with   Follow-up: 66 year old Caucasian female with history of depression, presented for medication management.   HPI: Maureen Pena is a 66 year old Caucasian female, divorced, retired, lives in [place], has a history of depression, tobacco use disorder, hypothyroidism was evaluated in office today.  Patient today reports she continues to foster puppies and currently has a 42-monthold puppy along with her other pets which includes a cat, an 134year old dog, a 183year-old puppy.  She reports she enjoys spending time with them and fostering them although it does have an impact on her sleep.  She tries to go to bed at around 10:30 PM and wakes up at around 5 or 5:30 AM since her 348-monthld puppy wakes her up.  She reports she is on the go all throughout the morning doing different things like working in the yard, taking care of her vegetable garden as well as taking care of her pets.  By the afternoon she does take a nap.  That helps her.  Currently denies any significant anxiety or depressive symptoms.  Patient reports she is compliant on the Lexapro.  Denies side effects.  Patient denies any suicidality, homicidality or perceptual disturbances.  Continues to smoke cigarettes, receptive to counseling.  Denies any other concerns today.  Visit Diagnosis:    ICD-10-CM   1. MDD (major depressive disorder), recurrent, [place] full [place] F33.42 escitalopram (LEXAPRO) 5 MG tablet    2. Tobacco use disorder  F17.200     3. Insomnia, unspecified type  G47.00       Past Psychiatric History: Reviewed past psychiatric history from progress note on 02/06/2018.  Past trials of Lexapro, hydroxyzine.  Past Medical History:  Past Medical History:  Diagnosis Date   Anxiety    Barrett's esophagus    Depression    Diverticulitis    History of  chicken pox    Thyroid disease    hypothyroidism   UTI (urinary tract infection)     Past Surgical History:  Procedure Laterality Date   ESOPHAGOGASTRODUODENOSCOPY (EGD) WITH PROPOFOL N/A 05/02/2018   Procedure: ESOPHAGOGASTRODUODENOSCOPY (EGD) WITH PROPOFOL;  Surgeon: WoLucilla LameMD;  [facility] Service: Endoscopy;  Laterality: N/A;   LEFT HEART CATH AND CORONARY ANGIOGRAPHY N/A 08/20/2019   Procedure: LEFT HEART CATH AND CORONARY ANGIOGRAPHY;  Surgeon: GoMinna MerrittsMD;  [facility];  Service: Cardiovascular;  Laterality: N/A;   TUBAL LIGATION      Family Psychiatric History: Reviewed family psychiatric history from progress note on 02/06/2018.  Family History:  Family History  Problem Relation Age of Onset   Depression Mother    Anxiety disorder Mother    Schizophrenia Mother    Paranoid behavior Mother    Stroke Mother    COPD Father    Early death Father    Hearing loss Father    Heart disease Father    CAD Father        s/p cabg x 3    Depression Brother    COPD Brother    Hypertension Brother    Kidney disease Brother    Pulmonary fibrosis Brother        died 6665/07-28-2020 Breast cancer Neg Hx     Social History: Reviewed social history from progress note on 02/06/2018. Social History   Socioeconomic History   Marital  status: Divorced    Spouse name: Not on file   Number of children: 2   Years of education: Not on file   Highest education level: Bachelor's degree (e.g., BA, AB, BS)  Occupational History    Comment: part time  Tobacco Use   Smoking status: Every Day    Packs/day: 0.25    Types: Cigarettes   Smokeless tobacco: Never  Vaping Use   Vaping Use: Never used  Substance and Sexual Activity   Alcohol use: Not Currently    Alcohol/week: 1.0 standard drink    Types: 1 Glasses of wine per week   Drug use: Yes    Types: Marijuana   Sexual activity: Not Currently  Other Topics Concern   Not on file   Social History Narrative   Kids    No guns    Wears seat belt    Safe in relationship    Smoker    Lives with Lexicographer    Social Determinants of Health   Financial Resource Strain: Not on file  Food Insecurity: Not on file  Transportation Needs: Not on file  Physical Activity: Not on file  Stress: Not on file  Social Connections: Not on file    Allergies: No Known Allergies  Metabolic Disorder Labs: Lab Results  Component Value Date   HGBA1C 5.4 10/12/2021   No results found for: PROLACTIN Lab Results  Component Value Date   CHOL 153 10/12/2021   TRIG 103.0 10/12/2021   HDL 66.80 10/12/2021   CHOLHDL 2 10/12/2021   VLDL 20.6 10/12/2021   LDLCALC 66 10/12/2021   LDLCALC 62 04/04/2021   Lab Results  Component Value Date   TSH 2.26 10/12/2021   TSH 0.78 04/04/2021    Therapeutic Level Labs: No results found for: LITHIUM No results found for: VALPROATE No components found for:  CBMZ  Current Medications: Current Outpatient Medications  Medication Sig Dispense Refill   aspirin 81 MG EC tablet Take 1 tablet (81 mg total) by mouth daily. 90 tablet 3   atorvastatin (LIPITOR) 10 MG tablet Take 1 tablet (10 mg total) by mouth daily at 6 PM. 90 tablet 1   Cholecalciferol (VITAMIN D-3) 5000 units TABS Take by mouth.     ezetimibe (ZETIA) 10 MG tablet Take 1 tablet (10 mg total) by mouth daily. 90 tablet 3   levothyroxine (SYNTHROID) 100 MCG tablet TAKE ONE TABLET BY MOUTH DAILY BEFORE BREAKFAST 6 DAYS PER WEEK. TAKE ONE-HALF TABLET BY MOUTH DAILY BEFORE BREAKFAST ON 1 DAY PER WEEK. STOP THE 112MCG DOSE 90 tablet 3   nitroGLYCERIN (NITROSTAT) 0.4 MG SL tablet Place 1 tablet (0.4 mg total) under the tongue every 5 (five) minutes x 3 doses as needed for chest pain. 30 tablet 2   Omega-3 1000 MG CAPS Take 1,000 mg by mouth daily.      escitalopram (LEXAPRO) 5 MG tablet Take 1 tablet (5 mg total) by mouth daily. 90 tablet 1   No current facility-administered  medications for this visit.     Musculoskeletal: Strength & Muscle Tone: within normal limits Gait & Station: normal Patient leans: N/A  Psychiatric Specialty Exam: Review of Systems  Psychiatric/Behavioral:  Positive for sleep disturbance.   All other systems reviewed and are negative.  Blood pressure 128/79, pulse 62, temperature 98.3 F (36.8 C), temperature source Temporal, weight 156 lb 12.8 oz (71.1 kg).Body mass index is 24.56 kg/m.  General Appearance: Casual  Eye Contact:  Fair  Speech:  Clear and Coherent  Volume:  Normal  Mood:  Euthymic  Affect:  Congruent  Thought Process:  Goal Directed and Descriptions of Associations: Intact  Orientation:  Full (Time, Place, and Person)  Thought Content: Logical   Suicidal Thoughts:  No  Homicidal Thoughts:  No  Memory:  Immediate;   Fair Recent;   Fair Remote;   Fair  Judgement:  Fair  Insight:  Fair  Psychomotor Activity:  Normal  Concentration:  Concentration: Fair and Attention Span: Fair  Recall:  [employer] of Knowledge: Fair  Language: Fair  Akathisia:  No  Handed:  Right  AIMS (if indicated): done  Assets:  Communication Skills Desire for Improvement Housing Social Support Transportation  ADL's:  Intact  Cognition: WNL  Sleep:   restless due to fostering puppies   Screenings: [facility] Office Visit from 11/15/2021 in [facility] Total Score 0      GAD-7    Flowsheet Row Video Visit from 11/17/2020 in [facility] Visit from 12/24/2019 in [facility] Video Visit from 08/27/2019 in [facility] Office Visit from 08/19/2019 in [facility] Office Visit from 02/17/2018 in [facility]  Total GAD-7 Score 1 0 0 0 9      PHQ2-9    [place] Visit from 11/15/2021 in [facility] Video Visit from 05/17/2021 in [facility] Visit from 04/14/2021 in [facility] Video Visit from 11/17/2020 in [facility] Visit from 11/08/2020 in [facility]  PHQ-2 Total Score 0 0 0 1 0  PHQ-9 Total Score 1 1 0 -- 0      [place] Office Visit from 11/15/2021 in [facility] No Risk        Assessment and Plan: Hafsah Hendler is a 66 year old Caucasian female, divorced, lives in [place], has a history of depression, GERD, anxiety, hypothyroidism was evaluated in office today.  Patient with sleep restlessness likely due to fostering puppies.  Otherwise doing fairly well.  Plan MDD in remission Lexapro 5 mg p.o. daily  Insomnia-unstable Patient will need to work on sleep hygiene techniques.  Tobacco use disorder-improving Provided counseling for 1 minute.  Currently cutting back.  Follow-up in clinic in 6 months or sooner if needed.   This note was generated in part or whole with voice recognition software. Voice recognition is usually quite accurate but there are transcription errors that can and very often do occur. I apologize for any typographical errors that were not detected and corrected.     Ursula Alert, MD 11/15/2021, 4:53 PM

## 2021-11-18 ENCOUNTER — Other Ambulatory Visit: Payer: Self-pay | Admitting: Psychiatry

## 2021-11-18 DIAGNOSIS — F3342 Major depressive disorder, recurrent, in full remission: Secondary | ICD-10-CM

## 2022-02-09 ENCOUNTER — Encounter: Payer: Commercial Managed Care - HMO | Admitting: Internal Medicine

## 2022-02-14 ENCOUNTER — Encounter: Payer: Self-pay | Admitting: Internal Medicine

## 2022-02-14 ENCOUNTER — Ambulatory Visit (INDEPENDENT_AMBULATORY_CARE_PROVIDER_SITE_OTHER): Payer: Commercial Managed Care - HMO | Admitting: Internal Medicine

## 2022-02-14 VITALS — BP 116/70 | HR 75 | Temp 98.6°F | Ht 65.75 in | Wt 155.0 lb

## 2022-02-14 DIAGNOSIS — E039 Hypothyroidism, unspecified: Secondary | ICD-10-CM | POA: Diagnosis not present

## 2022-02-14 DIAGNOSIS — Z1231 Encounter for screening mammogram for malignant neoplasm of breast: Secondary | ICD-10-CM

## 2022-02-14 DIAGNOSIS — Z0000 Encounter for general adult medical examination without abnormal findings: Secondary | ICD-10-CM | POA: Diagnosis not present

## 2022-02-14 DIAGNOSIS — I2510 Atherosclerotic heart disease of native coronary artery without angina pectoris: Secondary | ICD-10-CM

## 2022-02-14 DIAGNOSIS — Z23 Encounter for immunization: Secondary | ICD-10-CM | POA: Diagnosis not present

## 2022-02-14 DIAGNOSIS — E785 Hyperlipidemia, unspecified: Secondary | ICD-10-CM | POA: Diagnosis not present

## 2022-02-14 DIAGNOSIS — F3342 Major depressive disorder, recurrent, in full remission: Secondary | ICD-10-CM | POA: Diagnosis not present

## 2022-02-14 MED ORDER — LEVOTHYROXINE SODIUM 100 MCG PO TABS: ORAL_TABLET | ORAL | 3 refills | Status: DC

## 2022-02-14 MED ORDER — NITROGLYCERIN 0.4 MG SL SUBL: 0.4000 mg | SUBLINGUAL_TABLET | SUBLINGUAL | 2 refills | Status: DC | PRN

## 2022-02-14 MED ORDER — ESCITALOPRAM OXALATE 5 MG PO TABS: 5.0000 mg | ORAL_TABLET | Freq: Every day | ORAL | 3 refills | Status: DC

## 2022-02-14 MED ORDER — ATORVASTATIN CALCIUM 10 MG PO TABS: 10.0000 mg | ORAL_TABLET | Freq: Every day | ORAL | 3 refills | Status: DC

## 2022-02-14 MED ORDER — EZETIMIBE 10 MG PO TABS: 10.0000 mg | ORAL_TABLET | Freq: Every day | ORAL | 3 refills | Status: DC

## 2022-02-14 NOTE — Progress Notes (Signed)
Chief Complaint  Patient presents with   Annual Exam   Annual  1. Cad no chest pain on lipitor 10, zetia 10 prn ntg f/u cardiology Dr. Rockey Situ  2. Hypothyroidism on levo 100 mcg qd    Review of Systems  Constitutional:  Negative for weight loss.  HENT:  Negative for hearing loss.   Eyes:  Negative for blurred vision.  Respiratory:  Negative for shortness of breath.   Cardiovascular:  Negative for chest pain.  Gastrointestinal:  Negative for abdominal pain and blood in stool.  Genitourinary:  Negative for dysuria.  Musculoskeletal:  Negative for falls and joint pain.  Skin:  Negative for rash.  Neurological:  Negative for headaches.  Psychiatric/Behavioral:  Negative for depression.    Past Medical History:  Diagnosis Date   Anxiety    Barrett's esophagus    Depression    Diverticulitis    History of chicken pox    Thyroid disease    hypothyroidism   UTI (urinary tract infection)    Past Surgical History:  Procedure Laterality Date   ESOPHAGOGASTRODUODENOSCOPY (EGD) WITH PROPOFOL N/A 05/02/2018   Procedure: ESOPHAGOGASTRODUODENOSCOPY (EGD) WITH PROPOFOL;  Surgeon: Lucilla Lame, MD;  Location: [facility];  Service: Endoscopy;  Laterality: N/A;   LEFT HEART CATH AND CORONARY ANGIOGRAPHY N/A 08/20/2019   Procedure: LEFT HEART CATH AND CORONARY ANGIOGRAPHY;  Surgeon: Minna Merritts, MD;  Location: [facility];  Service: Cardiovascular;  Laterality: N/A;   TUBAL LIGATION     Family History  Problem Relation Age of Onset   Depression Mother    Anxiety disorder Mother    Schizophrenia Mother    Paranoid behavior Mother    Stroke Mother    COPD Father    Early death Father    Hearing loss Father    Heart disease Father    CAD Father        s/p cabg x 3    Depression Brother    COPD Brother    Hypertension Brother    Kidney disease Brother    Pulmonary fibrosis Brother        died 32 08-11-20   Breast cancer Neg Hx    Social History    Socioeconomic History   Marital status: Divorced    Spouse name: Not on file   Number of children: 2   Years of education: Not on file   Highest education level: Bachelor's degree (e.g., BA, AB, BS)  Occupational History    Comment: part time  Tobacco Use   Smoking status: Every Day    Packs/day: 0.25    Types: Cigarettes   Smokeless tobacco: Never  Vaping Use   Vaping Use: Never used  Substance and Sexual Activity   Alcohol use: Not Currently    Alcohol/week: 1.0 standard drink of alcohol    Types: 1 Glasses of wine per week   Drug use: Yes    Types: Marijuana   Sexual activity: Not Currently  Other Topics Concern   Not on file  Social History Narrative   Kids    No guns    Wears seat belt    Safe in relationship    Smoker    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 dogs and 1 cat   Lawyer    Divorced    Social Determinants of Health   Financial Resource Strain: Low Risk  (02/06/2018)   Overall Financial Resource Strain (CARDIA)    Difficulty of Paying Living Expenses: Not hard at  all  Food Insecurity: No Food Insecurity (02/06/2018)   Hunger Vital Sign    Worried About Running Out of Food in the Last Year: Never true    Ran Out of Food in the Last Year: Never true  Transportation Needs: No Transportation Needs (02/06/2018)   PRAPARE - Hydrologist (Medical): No    Lack of Transportation (Non-Medical): No  Physical Activity: Insufficiently Active (02/06/2018)   Exercise Vital Sign    Days of Exercise per Week: 7 days    Minutes of Exercise per Session: 20 min  Stress: Stress Concern Present (02/06/2018)   [place]    Feeling of Stress : To some extent  Social Connections: Moderately Isolated (02/06/2018)   Social Connection and Isolation Panel [NHANES]    Frequency of Communication with Friends and Family: More than three times a week    Frequency of Social Gatherings with Friends and  Family: Three times a week    Attends Religious Services: Never    Active Member of Clubs or Organizations: No    Attends Archivist Meetings: Never    Marital Status: Divorced  Human resources officer Violence: Not At Risk (02/06/2018)   Humiliation, Afraid, Rape, and Kick questionnaire    Fear of Current or Ex-Partner: No    Emotionally Abused: No    Physically Abused: No    Sexually Abused: No   Current Meds  Medication Sig   aspirin 81 MG EC tablet Take 1 tablet (81 mg total) by mouth daily.   Cholecalciferol (VITAMIN D-3) 5000 units TABS Take by mouth.   Omega-3 1000 MG CAPS Take 1,000 mg by mouth daily.    [DISCONTINUED] atorvastatin (LIPITOR) 10 MG tablet Take 1 tablet (10 mg total) by mouth daily at 6 PM.   [DISCONTINUED] escitalopram (LEXAPRO) 5 MG tablet TAKE ONE TABLET BY MOUTH DAILY   [DISCONTINUED] ezetimibe (ZETIA) 10 MG tablet Take 1 tablet (10 mg total) by mouth daily.       [DISCONTINUED] nitroGLYCERIN (NITROSTAT) 0.4 MG SL tablet Place 1 tablet (0.4 mg total) under the tongue every 5 (five) minutes x 3 doses as needed for chest pain.   No Known Allergies No results found for this or any previous visit (from the past 2160 hour(s)). Objective  Body mass index is 25.21 kg/m. Wt Readings from Last 3 Encounters:  02/14/22 155 lb (70.3 kg)  04/28/21 153 lb 6 oz (69.6 kg)  04/14/21 151 lb 6.4 oz (68.7 kg)   Temp Readings from Last 3 Encounters:  02/14/22 98.6 F (37 C) (Oral)  04/14/21 98.3 F (36.8 C) (Oral)  11/08/20 98.5 F (36.9 C) (Oral)   BP Readings from Last 3 Encounters:  02/14/22 116/70  04/28/21 120/80  04/14/21 104/76   Pulse Readings from Last 3 Encounters:  02/14/22 75  04/28/21 60  04/14/21 70    Physical Exam Vitals and nursing note reviewed.  Constitutional:      Appearance: Normal appearance. She is well-developed and well-groomed.  HENT:     Head: Normocephalic and atraumatic.  Eyes:     Conjunctiva/sclera: Conjunctivae  normal.     Pupils: Pupils are equal, round, and reactive to light.  Cardiovascular:     Rate and Rhythm: Normal rate and regular rhythm.     Heart sounds: Normal heart sounds. No murmur heard. Pulmonary:     Effort: Pulmonary effort is normal.     Breath sounds: Normal breath  sounds.  Abdominal:     General: Abdomen is flat. Bowel sounds are normal.     Tenderness: There is no abdominal tenderness.  Musculoskeletal:        General: No tenderness.  Skin:    General: Skin is warm and dry.  Neurological:     General: No focal deficit present.     Mental Status: She is alert and oriented to person, place, and time. Mental status is at baseline.     Cranial Nerves: Cranial nerves 2-12 are intact.     Motor: Motor function is intact.     Coordination: Coordination is intact.     Gait: Gait is intact.  Psychiatric:        Attention and Perception: Attention and perception normal.        Mood and Affect: Mood and affect normal.        Speech: Speech normal.        Behavior: Behavior normal. Behavior is cooperative.        Thought Content: Thought content normal.        Cognition and Memory: Cognition and memory normal.        Judgment: Judgment normal.     Assessment  Plan  Annual physical exam See below   MDD (major depressive disorder), recurrent, in full remission ([place]) - Plan: escitalopram (LEXAPRO) 5 MG tablet F/u psych she wants to wean her off of lexapro due to in remission pt considering   Hypothyroidism, unspecified type - Plan: levothyroxine (SYNTHROID) 100 MCG tablet qd  Hyperlipidemia, unspecified hyperlipidemia type - Plan: ezetimibe (ZETIA) 10 MG tablet, atorvastatin (LIPITOR) 10 MG tablet  Coronary artery disease involving native coronary artery of native heart without angina pectoris - Plan: nitroGLYCERIN (NITROSTAT) 0.4 MG SL tablet, ezetimibe (ZETIA) 10 MG tablet, atorvastatin (LIPITOR) 10 MG tablet F/u Dr. Rockey Situ   HM Flu shot given today Prevnar 04/14/21   Consider pna 20 shingrix per pt 2/2 had call and get dates tdap 03/08/20   Hep C neg 03/10/18  Declines MMR and hep B check covid 19  4/4 will get 02/2022    05/21/19 mammogram negative, ordered   right breast oil cyst 05/31/20 f/u 08/2020 order in due 05/2021 negative ordered 05/2022   Pap at f/u h/o abnormal x 1  -pap 04/11/18 neg neg HPV due in 04/11/21 Pap 04/14/21 negative neg hpv   Colonoscopy had 08/27/11 and EGD due repeat h/o barretts; EGD with Dr. Allen Norris 05/02/18 no barretts  Pt to let me know when ready colonoscopy referral As of 02/14/22 pt will wait until next year in 2024 Dr. Allen Norris    rec smoking cessation smoking 10-12 cig/qd no  FH lung cancer never smoked more than this    Derm sch 11/18 referred Dr. Nicole Kindred due 04/2022 or 05/2022     Provider: Dr. Olivia Mackie McLean-Scocuz[facility]

## 2022-02-14 NOTE — Patient Instructions (Addendum)
Call 05/30/22 mammogram [facility] [address]  Call for appt   Due 04/14/22  Pneumococcal Conjugate Vaccine (Prevnar 20) Suspension for Injection What is this medication? PNEUMOCOCCAL VACCINE (NEU mo KOK al vak SEEN) is a vaccine. It prevents pneumococcus bacterial infections. These bacteria can cause serious infections like pneumonia, meningitis, and blood infections. This vaccine will not treat an infection and will not cause infection. This vaccine is recommended for adults 18 years and older. This medicine may be used for other purposes; ask your health care provider or pharmacist if you have questions. COMMON BRAND NAME(S): Prevnar 20 What should I tell my care team before I take this medication? They need to know if you have any of these conditions: bleeding disorder fever immune system problems an unusual or allergic reaction to pneumococcal vaccine, diphtheria toxoid, other vaccines, other medicines, foods, dyes, or preservatives pregnant or trying to get pregnant breast-feeding How should I use this medication? This vaccine is injected into a muscle. It is given by a health care provider. A copy of Vaccine Information Statements will be given before each vaccination. Be sure to read this information carefully each time. This sheet may change often. Talk to your health care provider about the use of this medicine in children. Special care may be needed. Overdosage: If you think you have taken too much of this medicine contact a poison control center or emergency room at once. NOTE: This medicine is only for you. Do not share this medicine with others. What if I miss a dose? This does not apply. This medicine is not for regular use. What may interact with this medication? medicines for cancer chemotherapy medicines that suppress your immune function steroid medicines like prednisone or cortisone This list may not describe all possible interactions. Give your health care  provider a list of all the medicines, herbs, non-prescription drugs, or dietary supplements you use. Also tell them if you smoke, drink alcohol, or use illegal drugs. Some items may interact with your medicine. What should I watch for while using this medication? Mild fever and pain should go away in 3 days or less. Report any unusual symptoms to your health care provider. What side effects may I notice from receiving this medication? Side effects that you should report to your doctor or health care professional as soon as possible: allergic reactions (skin rash, itching or hives; swelling of the face, lips, or tongue) confusion fast, irregular heartbeat fever over 102 degrees F muscle weakness seizures trouble breathing unusual bruising or bleeding Side effects that usually do not require medical attention (report to your doctor or health care professional if they continue or are bothersome): fever of 102 degrees F or less headache joint pain muscle cramps, pain pain, tender at site where injected This list may not describe all possible side effects. Call your doctor for medical advice about side effects. You may report side effects to FDA at 1-800-FDA-1088. Where should I keep my medication? This vaccine is only given by a health care provider. It will not be stored at home. NOTE: This sheet is a summary. It may not cover all possible information. If you have questions about this medicine, talk to your doctor, pharmacist, or health care provider.  2023 Elsevier/Gold Standard (2020-01-29 00:00:00)

## 2022-04-02 ENCOUNTER — Ambulatory Visit: Payer: Commercial Managed Care - HMO | Admitting: Dermatology

## 2022-04-02 ENCOUNTER — Encounter: Payer: Self-pay | Admitting: Dermatology

## 2022-04-02 DIAGNOSIS — Z1283 Encounter for screening for malignant neoplasm of skin: Secondary | ICD-10-CM | POA: Diagnosis not present

## 2022-04-02 DIAGNOSIS — L578 Other skin changes due to chronic exposure to nonionizing radiation: Secondary | ICD-10-CM

## 2022-04-02 DIAGNOSIS — L719 Rosacea, unspecified: Secondary | ICD-10-CM

## 2022-04-02 DIAGNOSIS — L821 Other seborrheic keratosis: Secondary | ICD-10-CM

## 2022-04-02 DIAGNOSIS — I781 Nevus, non-neoplastic: Secondary | ICD-10-CM

## 2022-04-02 DIAGNOSIS — D225 Melanocytic nevi of trunk: Secondary | ICD-10-CM

## 2022-04-02 DIAGNOSIS — L814 Other melanin hyperpigmentation: Secondary | ICD-10-CM

## 2022-04-02 DIAGNOSIS — D229 Melanocytic nevi, unspecified: Secondary | ICD-10-CM

## 2022-04-02 NOTE — Progress Notes (Signed)
Follow-Up Visit   Subjective  Clemencia Helzer is a 66 y.o. female who presents for the following: Annual Exam (No personal Hx of skin cancer or dysplastic nevi. No family hx). The patient presents for Total-Body Skin Exam (TBSE) for skin cancer screening and mole check.  The patient has spots, moles and lesions to be evaluated, some may be new or changing and the patient has concerns that these could be cancer.  The following portions of the chart were reviewed this encounter and updated as appropriate:  Tobacco  Allergies  Meds  Problems  Med Hx  Surg Hx  Fam Hx     Review of Systems: No other skin or systemic complaints except as noted in HPI or Assessment and Plan.  Objective  Well appearing patient in no apparent distress; mood and affect are within normal limits.  A full examination was performed including scalp, head, eyes, ears, nose, lips, neck, chest, axillae, abdomen, back, buttocks, bilateral upper extremities, bilateral lower extremities, hands, feet, fingers, toes, fingernails, and toenails. All findings within normal limits unless otherwise noted below.  Right Frontal Scalp Stuck-on, waxy, tan-brown papule or plaque --Discussed benign etiology and prognosis.   face Mid face erythema with telangiectasias   Left chest Flesh colored regular papule   Assessment & Plan   Lentigines - Scattered tan macules - Due to sun exposure - Benign-appearing, observe - Recommend daily broad spectrum sunscreen SPF 30+ to sun-exposed areas, reapply every 2 hours as needed. - Call for any changes  Seborrheic Keratoses - Stuck-on, waxy, tan-brown papules and/or plaques  - Benign-appearing - Discussed benign etiology and prognosis. - Observe - Call for any changes  Melanocytic Nevi - Tan-brown and/or pink-flesh-colored symmetric macules and papules - Benign appearing on exam today - Observation - Call clinic for new or changing moles - Recommend daily use of broad  spectrum spf 30+ sunscreen to sun-exposed areas.   Hemangiomas - Red papules - Discussed benign nature - Observe - Call for any changes  Actinic Damage - Chronic condition, secondary to cumulative UV/sun exposure - diffuse scaly erythematous macules with underlying dyspigmentation - Recommend daily broad spectrum sunscreen SPF 30+ to sun-exposed areas, reapply every 2 hours as needed.  - Staying in the shade or wearing long sleeves, sun glasses (UVA+UVB protection) and wide brim hats (4-inch brim around the entire circumference of the hat) are also recommended for sun protection.  - Call for new or changing lesions.  Skin cancer screening performed today.  Telangiectasia - Dilated blood vessel - Benign appearing on exam - Call for changes  Seborrheic keratosis Right Frontal Scalp Reassured benign age-related growth.  Recommend observation.  Discussed cryotherapy if spot(s) become irritated or inflamed. Patient deferred treatment at this time.  Rosacea face Rosacea is a chronic progressive skin condition usually affecting the face of adults, causing redness and/or acne bumps. It is treatable but not curable. It sometimes affects the eyes (ocular rosacea) as well. It may respond to topical and/or systemic medication and can flare with stress, sun exposure, alcohol, exercise, topical steroids (including hydrocortisone/cortisone 10) and some foods.  Daily application of broad spectrum spf 30+ sunscreen to face is recommended to reduce flares.  Discussed the treatment option of BBL/laser.  Typically we recommend 1-3 treatment sessions about 5-8 weeks apart for best results.  The patient's condition may require "maintenance treatments" in the future.  The fee for BBL / laser treatments is $350 per treatment session for the whole face.  A fee can  be quoted for other parts of the body. Insurance typically does not pay for BBL/laser treatments and therefore the fee is an out-of-pocket  cost.  Nevus Left chest Benign-appearing.  Observation.  Call clinic for new or changing lesions.   Return in about 1 year (around 04/03/2023) for TBSE.  I, Emelia Salisbury, CMA, am acting as scribe for Sarina Ser, MD. Documentation: I have reviewed the above documentation for accuracy and completeness, and I agree with the above.  Sarina Ser, MD

## 2022-04-02 NOTE — Patient Instructions (Signed)
Recommend daily broad spectrum sunscreen SPF 30+ to sun-exposed areas, reapply every 2 hours as needed. Call for new or changing lesions.  Staying in the shade or wearing long sleeves, sun glasses (UVA+UVB protection) and wide brim hats (4-inch brim around the entire circumference of the hat) are also recommended for sun protection.    Melanoma ABCDEs  Melanoma is the most dangerous type of skin cancer, and is the leading cause of death from skin disease.  You are more likely to develop melanoma if you: Have light-colored skin, light-colored eyes, or red or blond hair Spend a lot of time in the sun Tan regularly, either outdoors or in a tanning bed Have had blistering sunburns, especially during childhood Have a close family member who has had a melanoma Have atypical moles or large birthmarks  Early detection of melanoma is key since treatment is typically straightforward and cure rates are extremely high if we catch it early.   The first sign of melanoma is often a change in a mole or a new dark spot.  The ABCDE system is a way of remembering the signs of melanoma.  A for asymmetry:  The two halves do not match. B for border:  The edges of the growth are irregular. C for color:  A mixture of colors are present instead of an even brown color. D for diameter:  Melanomas are usually (but not always) greater than 6mm - the size of a pencil eraser. E for evolution:  The spot keeps changing in size, shape, and color.  Please check your skin once per month between visits. You can use a small mirror in front and a large mirror behind you to keep an eye on the back side or your body.   If you see any new or changing lesions before your next follow-up, please call to schedule a visit.  Please continue daily skin protection including broad spectrum sunscreen SPF 30+ to sun-exposed areas, reapplying every 2 hours as needed when you're outdoors.   Staying in the shade or wearing long sleeves, sun  glasses (UVA+UVB protection) and wide brim hats (4-inch brim around the entire circumference of the hat) are also recommended for sun protection.     Due to recent changes in healthcare laws, you may see results of your pathology and/or laboratory studies on MyChart before the doctors have had a chance to review them. We understand that in some cases there may be results that are confusing or concerning to you. Please understand that not all results are received at the same time and often the doctors may need to interpret multiple results in order to provide you with the best plan of care or course of treatment. Therefore, we ask that you please give us 2 business days to thoroughly review all your results before contacting the office for clarification. Should we see a critical lab result, you will be contacted sooner.   If You Need Anything After Your Visit  If you have any questions or concerns for your doctor, please call our main line at [phone] and press option 4 to reach your doctor's medical assistant. If no one answers, please leave a voicemail as directed and we will return your call as soon as possible. Messages left after 4 pm will be answered the following business day.   You may also send us a message via MyChart. We typically respond to MyChart messages within 1-2 business days.  For prescription refills, please ask your pharmacy to contact   our office. Our fax number is [phone].  If you have an urgent issue when the clinic is closed that cannot wait until the next business day, you can page your doctor at the number below.    Please note that while we do our best to be available for urgent issues outside of office hours, we are not available 24/7.   If you have an urgent issue and are unable to reach us, you may choose to seek medical care at your doctor's office, retail clinic, urgent care center, or emergency room.  If you have a medical emergency, please immediately  call 911 or go to the emergency department.  Pager Numbers  - Dr. Kowalski: [phone]  - Dr. Moye: [phone]  - Dr. Stewart: [phone]  In the event of inclement weather, please call our main line at [phone] for an update on the status of any delays or closures.  Dermatology Medication Tips: Please keep the boxes that topical medications come in in order to help keep track of the instructions about where and how to use these. Pharmacies typically print the medication instructions only on the boxes and not directly on the medication tubes.   If your medication is too expensive, please contact our office at [phone] option 4 or send us a message through MyChart.   We are unable to tell what your co-pay for medications will be in advance as this is different depending on your insurance coverage. However, we may be able to find a substitute medication at lower cost or fill out paperwork to get insurance to cover a needed medication.   If a prior authorization is required to get your medication covered by your insurance company, please allow us 1-2 business days to complete this process.  Drug prices often vary depending on where the prescription is filled and some pharmacies may offer cheaper prices.  The website www.goodrx.com contains coupons for medications through different pharmacies. The prices here do not account for what the cost may be with help from insurance (it may be cheaper with your insurance), but the website can give you the price if you did not use any insurance.  - You can print the associated coupon and take it with your prescription to the pharmacy.  - You may also stop by our office during regular business hours and pick up a GoodRx coupon card.  - If you need your prescription sent electronically to a different pharmacy, notify our office through [facility] MyChart or by phone at [phone] option 4.     Si Usted Necesita Algo Despus de Su  Visita  Tambin puede enviarnos un mensaje a travs de MyChart. Por lo general respondemos a los mensajes de MyChart en el transcurso de 1 a 2 das hbiles.  Para renovar recetas, por favor pida a su farmacia que se ponga en contacto con nuestra oficina. Nuestro nmero de fax es el [phone].  Si tiene un asunto urgente cuando la clnica est cerrada y que no puede esperar hasta el siguiente da hbil, puede llamar/localizar a su doctor(a) al nmero que aparece a continuacin.   Por favor, tenga en cuenta que aunque hacemos todo lo posible para estar disponibles para asuntos urgentes fuera del horario de oficina, no estamos disponibles las 24 horas del da, los 7 das de la semana.   Si tiene un problema urgente y no puede comunicarse con nosotros, puede optar por buscar atencin mdica  en el consultorio de su doctor(a), en una clnica privada,   en un centro de atencin urgente o en una sala de emergencias.  Si tiene una emergencia mdica, por favor llame inmediatamente al 911 o vaya a la sala de emergencias.  Nmeros de bper  - Dr. Kowalski: [phone]  - Dra. Moye: [phone]  - Dra. Stewart: [phone]  En caso de inclemencias del tiempo, por favor llame a nuestra lnea principal al [phone] para una actualizacin sobre el estado de cualquier retraso o cierre.  Consejos para la medicacin en dermatologa: Por favor, guarde las cajas en las que vienen los medicamentos de uso tpico para ayudarle a seguir las instrucciones sobre dnde y cmo usarlos. Las farmacias generalmente imprimen las instrucciones del medicamento slo en las cajas y no directamente en los tubos del medicamento.   Si su medicamento es muy caro, por favor, pngase en contacto con nuestra oficina llamando al [phone] y presione la opcin 4 o envenos un mensaje a travs de MyChart.   No podemos decirle cul ser su copago por los medicamentos por adelantado ya que esto es diferente dependiendo de la  cobertura de su seguro. Sin embargo, es posible que podamos encontrar un medicamento sustituto a menor costo o llenar un formulario para que el seguro cubra el medicamento que se considera necesario.   Si se requiere una autorizacin previa para que su compaa de seguros cubra su medicamento, por favor permtanos de 1 a 2 das hbiles para completar este proceso.  Los precios de los medicamentos varan con frecuencia dependiendo del lugar de dnde se surte la receta y alguna farmacias pueden ofrecer precios ms baratos.  El sitio web www.goodrx.com tiene cupones para medicamentos de diferentes farmacias. Los precios aqu no tienen en cuenta lo que podra costar con la ayuda del seguro (puede ser ms barato con su seguro), pero el sitio web puede darle el precio si no utiliz ningn seguro.  - Puede imprimir el cupn correspondiente y llevarlo con su receta a la farmacia.  - Tambin puede pasar por nuestra oficina durante el horario de atencin regular y recoger una tarjeta de cupones de GoodRx.  - Si necesita que su receta se enve electrnicamente a una farmacia diferente, informe a nuestra oficina a travs de MyChart de [place] o por telfono llamando al [phone] y presione la opcin 4.  

## 2022-04-08 ENCOUNTER — Encounter: Payer: Self-pay | Admitting: Dermatology

## 2022-04-26 ENCOUNTER — Ambulatory Visit: Payer: Commercial Managed Care - HMO | Admitting: Podiatry

## 2022-04-26 DIAGNOSIS — M7752 Other enthesopathy of left foot: Secondary | ICD-10-CM | POA: Diagnosis not present

## 2022-04-26 DIAGNOSIS — G5762 Lesion of plantar nerve, left lower limb: Secondary | ICD-10-CM | POA: Diagnosis not present

## 2022-05-02 ENCOUNTER — Encounter: Payer: Self-pay | Admitting: Podiatry

## 2022-05-02 NOTE — Progress Notes (Signed)
Subjective:  Patient ID: Maureen Pena, female    DOB: 1956-04-04,  MRN: [id number]  Chief Complaint  Patient presents with   Neuroma    66 y.o. female presents with the above complaint.  Patient presents with complaint left second interspace pain/neuroma/capsulitis.  Patient states is painful to touch.  There are some discomfort that starts in the toes she states she gets some numbness tingling.  She went to get it evaluated has not seen MRIs prior to seeing me denies any other acute complaints not a diabetic.  Pain scale is 5 out of 10 achy in nature.   Review of Systems: Negative except as noted in the HPI. Denies N/V/F/Ch.  Past Medical History:  Diagnosis Date   Anxiety    Barrett's esophagus    Depression    Diverticulitis    History of chicken pox    Thyroid disease    hypothyroidism   UTI (urinary tract infection)     Current Outpatient Medications:    aspirin 81 MG EC tablet, Take 1 tablet (81 mg total) by mouth daily., Disp: 90 tablet, Rfl: 3   atorvastatin (LIPITOR) 10 MG tablet, Take 1 tablet (10 mg total) by mouth daily at 6 PM., Disp: 90 tablet, Rfl: 3   Cholecalciferol (VITAMIN D-3) 5000 units TABS, Take by mouth., Disp: , Rfl:    escitalopram (LEXAPRO) 5 MG tablet, Take 1 tablet (5 mg total) by mouth daily., Disp: 90 tablet, Rfl: 3   ezetimibe (ZETIA) 10 MG tablet, Take 1 tablet (10 mg total) by mouth daily., Disp: 90 tablet, Rfl: 3   levothyroxine (SYNTHROID) 100 MCG tablet, Take 1 30 minutes before meal in the am qd, Disp: 90 tablet, Rfl: 3   nitroGLYCERIN (NITROSTAT) 0.4 MG SL tablet, Place 1 tablet (0.4 mg total) under the tongue every 5 (five) minutes x 3 doses as needed for chest pain., Disp: 30 tablet, Rfl: 2   Omega-3 1000 MG CAPS, Take 1,000 mg by mouth daily. , Disp: , Rfl:   Social History   Tobacco Use  Smoking Status Every Day   Packs/day: 0.25   Types: Cigarettes  Smokeless Tobacco Never    No Known Allergies Objective:  There were  no vitals filed for this visit. There is no height or weight on file to calculate BMI. Constitutional Well developed. Well nourished.  Vascular Dorsalis pedis pulses palpable bilaterally. Posterior tibial pulses palpable bilaterally. Capillary refill normal to all digits.  No cyanosis or clubbing noted. Pedal hair growth normal.  Neurologic Normal speech. Oriented to person, place, and time. Epicritic sensation to light touch grossly present bilaterally.  Dermatologic Nails well groomed and normal in appearance. No open wounds. No skin lesions.  Orthopedic: Pain on palpation to the left second interspace positive Mulder's click noted.  Positive [place] sign noted.  Some pain with range of motion of the metatarsophalangeal joint of the second therefore a capsulitis cannot be excluded   Radiographs: None Assessment:   1. Capsulitis of metatarsophalangeal (MTP) joint of left foot   2. Neuroma of second interspace of left foot    Plan:  Patient was evaluated and treated and all questions answered.  Left second interspace neuroma versus capsulitis -All questions and concerns were discussed with the patient in extensive detail. -Given the amount of pain that she is experiencing she will benefit from a steroid injection to help decrease acute inflammation around the nerve/the capsule.  I discussed with patient she states that she still like to proceed with  steroid injection. -A steroid injection was performed at left second MTP using 1% plain Lidocaine and 10 mg of Kenalog. This was well tolerated. -If there is no improvement we will discuss alcohol injection versus surgical excision   No follow-ups on file.

## 2022-05-05 NOTE — Progress Notes (Signed)
Cardiology Office Note  Date:  05/08/2022   ID:  Maureen Pena, DOB [birth year], MRN [id number]  PCP:  McLean-Scocuzza, Nino Glow, MD   Chief Complaint  Patient presents with   12 month follow up     "Doing well." Medications reviewed by the patient verbally.     HPI:  66 year old woman with history of  smoking,  hyperlipidemia,  strong family history of coronary disease  Late presenting STEMI, initial event August 16, 2019 Severe single-vessel disease proximal to mid left circumflex after OM1 takeoff Collaterals noted  medical management was recommended Who presents for follow up of her CAD  Last office visit with myself November 2022  Previously Fostering german Shephards,  Has Two dogs of her own  Seen by podiatry, foot pain Got injection Less active, not using her treadmill secondary to foot pain  Denies angina on exertion Still smoking, trying to quit <1/2 ppd,  5 to 8 a day  Labs reviewed  On  lipitor 10 mg daily/zetia,  Total chol 153, LDL 66, trending upwards  EKG personally reviewed by myself on todays visit NSR rate 71 bpm, T wave ABN anterolateral elads, no change  Other past medical history reviewed Last seen in clinic May 2021 At that time was still smoking, previously tried Chantix Had not tried other modalities Was active  Other past medical history reviewed chest pain August 16, 2019 developed 15 minutes of chest pain radiating through to her back between her scapulas.   did not seek out assistance, Seen by primary care March 10, lab work performed showing elevated D-dimer, elevated troponin  presented to the emergency room, initial EKG concerning for late presenting inferior wall STEMI.     PMH:   has a past medical history of Anxiety, Barrett's esophagus, Depression, Diverticulitis, History of chicken pox, Thyroid disease, and UTI (urinary tract infection).  PSH:    Past Surgical History:  Procedure Laterality Date    ESOPHAGOGASTRODUODENOSCOPY (EGD) WITH PROPOFOL N/A 05/02/2018   Procedure: ESOPHAGOGASTRODUODENOSCOPY (EGD) WITH PROPOFOL;  Surgeon: Lucilla Lame, MD;  Location: [facility];  Service: Endoscopy;  Laterality: N/A;   LEFT HEART CATH AND CORONARY ANGIOGRAPHY N/A 08/20/2019   Procedure: LEFT HEART CATH AND CORONARY ANGIOGRAPHY;  Surgeon: Minna Merritts, MD;  Location: [facility];  Service: Cardiovascular;  Laterality: N/A;   TUBAL LIGATION      Current Outpatient Medications  Medication Sig Dispense Refill   aspirin 81 MG EC tablet Take 1 tablet (81 mg total) by mouth daily. 90 tablet 3   atorvastatin (LIPITOR) 10 MG tablet Take 1 tablet (10 mg total) by mouth daily at 6 PM. 90 tablet 3   Cholecalciferol (VITAMIN D-3) 5000 units TABS Take by mouth.     escitalopram (LEXAPRO) 5 MG tablet Take 1 tablet (5 mg total) by mouth daily. 90 tablet 3   ezetimibe (ZETIA) 10 MG tablet Take 1 tablet (10 mg total) by mouth daily. 90 tablet 3   levothyroxine (SYNTHROID) 100 MCG tablet Take 1 30 minutes before meal in the am qd 90 tablet 3   nitroGLYCERIN (NITROSTAT) 0.4 MG SL tablet Place 1 tablet (0.4 mg total) under the tongue every 5 (five) minutes x 3 doses as needed for chest pain. 30 tablet 2   No current facility-administered medications for this visit.    Allergies:   Patient has no known allergies.   Social History:  The patient  reports that she has been smoking cigarettes. She has been smoking  an average of .25 packs per day. She has never used smokeless tobacco. She reports that she does not currently use alcohol after a past usage of about 1.0 standard drink of alcohol per week. She reports current drug use. Drug: Marijuana.   Family History:   family history includes Anxiety disorder in her mother; CAD in her father; COPD in her brother and father; Depression in her brother and mother; Early death in her father; Hearing loss in her father; Heart disease in her father;  Hypertension in her brother; Kidney disease in her brother; Paranoid behavior in her mother; Pulmonary fibrosis in her brother; Schizophrenia in her mother; Stroke in her mother.    Review of Systems: Review of Systems  Constitutional: Negative.   HENT: Negative.    Respiratory: Negative.    Cardiovascular: Negative.   Gastrointestinal: Negative.   Musculoskeletal: Negative.        Foot pain  Neurological: Negative.   Psychiatric/Behavioral: Negative.    All other systems reviewed and are negative.   PHYSICAL EXAM: VS:  BP 110/70 (BP Location: Left Arm, Patient Position: Sitting, Cuff Size: Normal)   Pulse 71   Ht '5\' 7"'$  (1.702 m)   Wt 156 lb 8 oz (71 kg)   SpO2 97%   BMI 24.51 kg/m  , BMI Body mass index is 24.51 kg/m. Constitutional:  oriented to person, place, and time. No distress.  HENT:  Head: Grossly normal Eyes:  no discharge. No scleral icterus.  Neck: No JVD, no carotid bruits  Cardiovascular: Regular rate and rhythm, no murmurs appreciated Pulmonary/Chest: Clear to auscultation bilaterally, no wheezes or rails Abdominal: Soft.  no distension.  no tenderness.  Musculoskeletal: Normal range of motion Neurological:  normal muscle tone. Coordination normal. No atrophy Skin: Skin warm and dry Psychiatric: normal affect, pleasant   Recent Labs: 10/12/2021: ALT 20; BUN 19; Creatinine, Ser 0.92; Hemoglobin 15.1; Platelets 203.0; Potassium 4.8; Sodium 141; TSH 2.26    Lipid Panel Lab Results  Component Value Date   CHOL 153 10/12/2021   HDL 66.80 10/12/2021   LDLCALC 66 10/12/2021   TRIG 103.0 10/12/2021      Wt Readings from Last 3 Encounters:  05/08/22 156 lb 8 oz (71 kg)  02/14/22 155 lb (70.3 kg)  04/28/21 153 lb 6 oz (69.6 kg)     ASSESSMENT AND PLAN:  Problem List Items Addressed This Visit       Cardiology Problems   Coronary artery disease involving native coronary artery of native heart without angina pectoris - Primary   Relevant Orders    EKG 12-Lead     Other   Gastroesophageal reflux disease without esophagitis   Other Visit Diagnoses     Hyperlipidemia LDL goal <70       Relevant Orders   EKG 12-Lead   Nonrheumatic mitral valve regurgitation       Relevant Orders   EKG 12-Lead   Smoker         Coronary disease with stable angina Currently with no symptoms of angina. No further workup at this time. Continue current medication regimen.  Smoker Smoking less than 1/2 pack/day We have encouraged her to continue to work on weaning her cigarettes and smoking cessation.  Hyperlipidemia Cholesterol slightly above goal, repeat lipid panel today May need to increase Lipitor up to 20 mg daily with Zetia if above goal  Hypothyroid followed by PMD    Total encounter time more than 30 minutes  Greater than 50% was spent  in counseling and coordination of care with the patient    Signed, Esmond Plants, M.D., Ph.D. [place], [place]

## 2022-05-08 ENCOUNTER — Ambulatory Visit: Payer: Commercial Managed Care - HMO | Attending: Cardiovascular Disease | Admitting: Cardiovascular Disease

## 2022-05-08 ENCOUNTER — Encounter: Payer: Self-pay | Admitting: Cardiovascular Disease

## 2022-05-08 ENCOUNTER — Other Ambulatory Visit
Admission: RE | Admit: 2022-05-08 | Discharge: 2022-05-08 | Disposition: A | Discharge status: Home / Self Care | Payer: Commercial Managed Care - HMO | Attending: Cardiovascular Disease | Admitting: Cardiovascular Disease

## 2022-05-08 VITALS — BP 110/70 | HR 71 | Ht 67.0 in | Wt 156.5 lb

## 2022-05-08 DIAGNOSIS — F17200 Nicotine dependence, unspecified, uncomplicated: Secondary | ICD-10-CM | POA: Diagnosis not present

## 2022-05-08 DIAGNOSIS — E785 Hyperlipidemia, unspecified: Secondary | ICD-10-CM

## 2022-05-08 DIAGNOSIS — K219 Gastro-esophageal reflux disease without esophagitis: Secondary | ICD-10-CM

## 2022-05-08 DIAGNOSIS — I340 Nonrheumatic mitral (valve) insufficiency: Secondary | ICD-10-CM | POA: Diagnosis not present

## 2022-05-08 DIAGNOSIS — I2510 Atherosclerotic heart disease of native coronary artery without angina pectoris: Secondary | ICD-10-CM

## 2022-05-08 LAB — LIPID PANEL
Cholesterol: 154 mg/dL (ref 0–200)
HDL: 70 mg/dL (ref >40)
LDL Cholesterol: 70 mg/dL (ref 0–99)
Total CHOL/HDL Ratio: 2.2 RATIO
Triglycerides: 69 mg/dL (ref <150)
VLDL: 14 mg/dL (ref 0–40)

## 2022-05-08 NOTE — Patient Instructions (Addendum)
Medication Instructions:  No changes  If you need a refill on your cardiac medications before your next appointment, please call your pharmacy.   Lab work: Lipid panel in the hospital - Please go to the [facility]. You will check in at the front desk to the right as you walk into the atrium. Valet Parking is offered if needed. - No appointment needed. You may go any day between 7 am and 6 pm.   Testing/Procedures: No new testing needed  Follow-Up: At [facility], you and your health needs are our priority.  As part of our continuing mission to provide you with exceptional heart care, we have created designated Provider Care Teams.  These Care Teams include your primary Cardiologist (physician) and Advanced Practice Providers (APPs -  Physician Assistants and Nurse Practitioners) who all work together to provide you with the care you need, when you need it.  You will need a follow up appointment in 12 months  Providers on your designated Care Team:   Murray Hodgkins, NP Christell Faith, PA-C Cadence Kathlen Mody, [place]  COVID-19 Vaccine Information can be found at: ShippingScam.co.uk For questions related to vaccine distribution or appointments, please email [email or web address] or call [phone].

## 2022-05-09 ENCOUNTER — Telehealth: Payer: Self-pay

## 2022-05-09 DIAGNOSIS — I2510 Atherosclerotic heart disease of native coronary artery without angina pectoris: Secondary | ICD-10-CM

## 2022-05-09 DIAGNOSIS — E785 Hyperlipidemia, unspecified: Secondary | ICD-10-CM

## 2022-05-09 MED ORDER — ATORVASTATIN CALCIUM 20 MG PO TABS: 20.0000 mg | ORAL_TABLET | Freq: Every day | ORAL | 5 refills | Status: DC

## 2022-05-09 NOTE — Telephone Encounter (Signed)
The patient has been notified of the result via VM per DPR on file.  Encouraged patient to call back with any questions or concerns.  

## 2022-05-09 NOTE — Telephone Encounter (Signed)
-----   Message from Minna Merritts, MD sent at 05/09/2022  9:02 AM EST ----- Lipid panel Numbers are stable, slightly higher from 2022 Would recommend we try to push numbers a little bit lower Would recommend increasing Lipitor from 10 up to 20 mg daily, stay on Zetia

## 2022-05-17 ENCOUNTER — Encounter: Payer: Self-pay | Admitting: Psychiatry

## 2022-05-17 ENCOUNTER — Ambulatory Visit: Payer: Medicare Other | Admitting: Psychiatry

## 2022-05-17 VITALS — BP 108/71 | HR 66 | Temp 97.1°F | Ht 67.0 in | Wt 154.4 lb

## 2022-05-17 DIAGNOSIS — F17200 Nicotine dependence, unspecified, uncomplicated: Secondary | ICD-10-CM

## 2022-05-17 DIAGNOSIS — F3342 Major depressive disorder, recurrent, in full remission: Secondary | ICD-10-CM | POA: Diagnosis not present

## 2022-05-17 MED ORDER — VARENICLINE TARTRATE 0.5 MG PO TABS: 0.5000 mg | ORAL_TABLET | Freq: Two times a day (BID) | ORAL | 2 refills | Status: DC

## 2022-05-17 NOTE — Progress Notes (Signed)
[facility] MD OP Progress Note  05/17/2022 6:01 PM Maureen Pena  MRN:  [id number]  Chief Complaint:  Chief Complaint  Patient presents with   Follow-up   Medication Refill   Depression   HPI: Maureen Pena is a 66 year old Caucasian female, divorced, retired, lives in [place], has a history of MDD, tobacco use disorder, hypothyroidism was evaluated in office today.  Patient today reports she is currently grieving the loss of her 65 year old Green who passed away in 2023/04/16.  Patient reports it was hard and became tearful when she discussed it.  She does have 2 puppies and a cat and she spends time with them and that has been helpful.  Patient reports overall mood symptoms as stable otherwise.  Reports sleep has been disrupted the past few nights since there is renovation going on at her home and she had to sleep on her couch.  She reports that has been completed and tonight she will be able to sleep on her bed finally.  Patient does report pain in her left foot likely from neuroma or capsulitis, that does limit her ability to be on her foot too long and also has been limiting her ability to exercise.  She does have hyperlipidemia and this currently under the care of cardiologist and would like to work out more.  She has upcoming appointment with her podiatrist and plans to discuss this further.  Patient reports she looks forward to the holiday season since she has family coming over.  She reports she is also thinking about relocating to [place] at some point.  Currently compliant on the Lexapro.  Denies side effects.  Continues to smoke cigarettes however is currently interested in cutting back or quitting.  Has tried Chantix in the past, agreeable to retrial.  Denies any suicidality, homicidality or perceptual disturbances.  3 word memory immediately 3 out of 3 after 5 minutes 2 out of 3.  Patient with good attention and focus during the session.  Patient appeared to  be alert, oriented to person place time situation.  Patient denies any other concerns today.  Visit Diagnosis:    ICD-10-CM   1. MDD (major depressive disorder), recurrent, in full remission ([place])  F33.42     2. Tobacco use disorder  F17.200 varenicline (CHANTIX) 0.5 MG tablet      Past Psychiatric History: Reviewed past psychiatric history from progress note on 02/06/2018.  Past trials of Lexapro, hydroxyzine.  Past Medical History:  Past Medical History:  Diagnosis Date   Anxiety    Barrett's esophagus    Depression    Diverticulitis    History of chicken pox    Thyroid disease    hypothyroidism   UTI (urinary tract infection)     Past Surgical History:  Procedure Laterality Date   ESOPHAGOGASTRODUODENOSCOPY (EGD) WITH PROPOFOL N/A 05/02/2018   Procedure: ESOPHAGOGASTRODUODENOSCOPY (EGD) WITH PROPOFOL;  Surgeon: Lucilla Lame, MD;  Location: [facility];  Service: Endoscopy;  Laterality: N/A;   LEFT HEART CATH AND CORONARY ANGIOGRAPHY N/A 08/20/2019   Procedure: LEFT HEART CATH AND CORONARY ANGIOGRAPHY;  Surgeon: Minna Merritts, MD;  Location: [facility];  Service: Cardiovascular;  Laterality: N/A;   TUBAL LIGATION      Family Psychiatric History: Reviewed family psychiatric history from progress note on 02/06/2018.  Family History:  Family History  Problem Relation Age of Onset   Depression Mother    Anxiety disorder Mother    Schizophrenia Mother    Paranoid behavior Mother  Stroke Mother    COPD Father    Early death Father    Hearing loss Father    Heart disease Father    CAD Father        s/p cabg x 3    Depression Brother    COPD Brother    Hypertension Brother    Kidney disease Brother    Pulmonary fibrosis Brother        died 17 Aug 08, 2020   Breast cancer Neg Hx     Social History: Reviewed social history from progress note on 02/06/2018. Social History   Socioeconomic History   Marital status: Divorced    Spouse name: Not on  file   Number of children: 2   Years of education: Not on file   Highest education level: Bachelor's degree (e.g., BA, AB, BS)  Occupational History    Comment: part time  Tobacco Use   Smoking status: Every Day    Packs/day: 0.25    Types: Cigarettes   Smokeless tobacco: Never  Vaping Use   Vaping Use: Never used  Substance and Sexual Activity   Alcohol use: Not Currently    Alcohol/week: 1.0 standard drink of alcohol    Types: 1 Glasses of wine per week   Drug use: Yes    Types: Marijuana   Sexual activity: Not Currently  Other Topics Concern   Not on file  Social History Narrative   Kids    No guns    Wears seat belt    Safe in relationship    Smoker    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 dogs and 1 cat   Lawyer    Divorced    Social Determinants of Health   Financial Resource Strain: Low Risk  (02/06/2018)   Overall Financial Resource Strain (CARDIA)    Difficulty of Paying Living Expenses: Not hard at all  Food Insecurity: No Food Insecurity (02/06/2018)   Hunger Vital Sign    Worried About Running Out of Food in the Last Year: Never true    Ran Out of Food in the Last Year: Never true  Transportation Needs: No Transportation Needs (02/06/2018)   PRAPARE - Hydrologist (Medical): No    Lack of Transportation (Non-Medical): No  Physical Activity: Insufficiently Active (02/06/2018)   Exercise Vital Sign    Days of Exercise per Week: 7 days    Minutes of Exercise per Session: 20 min  Stress: Stress Concern Present (02/06/2018)   [place]    Feeling of Stress : To some extent  Social Connections: Moderately Isolated (02/06/2018)   Social Connection and Isolation Panel [NHANES]    Frequency of Communication with Friends and Family: More than three times a week    Frequency of Social Gatherings with Friends and Family: Three times a week    Attends Religious Services: Never    Active Member  of Clubs or Organizations: No    Attends Archivist Meetings: Never    Marital Status: Divorced    Allergies: No Known Allergies  Metabolic Disorder Labs: Lab Results  Component Value Date   HGBA1C 5.4 10/12/2021   No results found for: "PROLACTIN" Lab Results  Component Value Date   CHOL 154 05/08/2022   TRIG 69 05/08/2022   HDL 70 05/08/2022   CHOLHDL 2.2 05/08/2022   VLDL 14 05/08/2022   LDLCALC 70 05/08/2022   [place] 66 10/12/2021   Lab Results  Component Value Date   TSH 2.26 10/12/2021   TSH 0.78 04/04/2021    Therapeutic Level Labs: No results found for: "LITHIUM" No results found for: "VALPROATE" No results found for: "CBMZ"  Current Medications: Current Outpatient Medications  Medication Sig Dispense Refill   aspirin 81 MG EC tablet Take 1 tablet (81 mg total) by mouth daily. 90 tablet 3   atorvastatin (LIPITOR) 20 MG tablet Take 1 tablet (20 mg total) by mouth daily at 6 PM. 30 tablet 5   Cholecalciferol (VITAMIN D-3) 5000 units TABS Take by mouth.     escitalopram (LEXAPRO) 5 MG tablet Take 1 tablet (5 mg total) by mouth daily. 90 tablet 3   ezetimibe (ZETIA) 10 MG tablet Take 1 tablet (10 mg total) by mouth daily. 90 tablet 3   levothyroxine (SYNTHROID) 100 MCG tablet Take 1 30 minutes before meal in the am qd 90 tablet 3   nitroGLYCERIN (NITROSTAT) 0.4 MG SL tablet Place 1 tablet (0.4 mg total) under the tongue every 5 (five) minutes x 3 doses as needed for chest pain. 30 tablet 2   varenicline (CHANTIX) 0.5 MG tablet Take 1 tablet (0.5 mg total) by mouth 2 (two) times daily. 60 tablet 2   meloxicam (MOBIC) 15 MG tablet Take 1 tablet every day by oral route. (Patient not taking: Reported on 05/17/2022)     No current facility-administered medications for this visit.     Musculoskeletal: Strength & Muscle Tone: within normal limits Gait & Station: normal Patient leans: N/A  Psychiatric Specialty Exam: Review of Systems  Musculoskeletal:         Left foot pain   Psychiatric/Behavioral:  Positive for sleep disturbance (Due to sleeping on the couch).        Grieving  All other systems reviewed and are negative.   Blood pressure 108/71, pulse 66, temperature (!) 97.1 F (36.2 C), temperature source Oral, height '5\' 7"'$  (1.702 m), weight 154 lb 6.4 oz (70 kg).Body mass index is 24.18 kg/m.  General Appearance: Casual  Eye Contact:  Good  Speech:  Clear and Coherent  Volume:  Normal  Mood:   Grieving  Affect:  Tearful  Thought Process:  Goal Directed and Descriptions of Associations: Intact  Orientation:  Full (Time, Place, and Person)  Thought Content: Logical   Suicidal Thoughts:  No  Homicidal Thoughts:  No  Memory:  Immediate;   Fair Recent;   Fair Remote;   Fair  Judgement:  Fair  Insight:  Fair  Psychomotor Activity:  Normal  Concentration:  Concentration: Fair and Attention Span: Fair  Recall:  [employer] of Knowledge: Fair  Language: Fair  Akathisia:  No  Handed:  Right  AIMS (if indicated): not done  Assets:  Communication Skills Desire for [place] Talents/Skills  ADL's:  Intact  Cognition: WNL  Sleep:   restless   Screenings: [place] Office Visit from 11/15/2021 in [facility] Total Score 0      [place] Office Visit from 05/17/2022 in [facility] Video Visit from 11/17/2020 in [facility] Visit from 12/24/2019 in [facility] Video Visit from 08/27/2019 in [facility] Office Visit from 08/19/2019 in [facility]  Total GAD-7 Score 0 1 0 0 0      PHQ2-9    [place] Office Visit from 05/17/2022 in [facility] Office Visit from 02/14/2022 in [facility] Office Visit from 11/15/2021 in [facility] Video Visit from  05/17/2021 in [facility] Office Visit from 04/14/2021 in [facility]  PHQ-2 Total Score 1 0 0 0 0  PHQ-9 Total Score 3 -- 1 1 [place] Visit from 05/17/2022 in [facility] Office Visit from 11/15/2021 in [facility] No Risk No Risk        Assessment and Plan: Maureen Pena is a 66 year old Caucasian female, divorced, lives in [place], has a history of depression, GERD, hypothyroidism was evaluated in office today.  Patient is currently grieving the loss of her dog, otherwise compliant on medications, plan as noted below.  Plan MDD in remission Lexapro 5 mg p.o. daily  Tobacco use disorder-unstable Start Chantix 0.5 mg p.o. twice daily Provided counseling for 1 minute.  Provided brief grief counseling.  Patient is coping okay.  Patient advised to follow-up with podiatrist for her foot pain.   Follow-up in clinic in 5 to 6 months or sooner if needed.   This note was generated in part or whole with voice recognition software. Voice recognition is usually quite accurate but there are transcription errors that can and very often do occur. I apologize for any typographical errors that were not detected and corrected.    Ursula Alert, MD 05/17/2022, 6:01 PM

## 2022-05-17 NOTE — Patient Instructions (Addendum)
Gladstone Lighter, MD Internist in [place], [place] Address: [address] [place], [place], [place] [address] Phone: [phone]

## 2022-05-18 ENCOUNTER — Encounter: Payer: Self-pay | Admitting: Cardiovascular Disease

## 2022-05-23 DIAGNOSIS — H43811 Vitreous degeneration, right eye: Secondary | ICD-10-CM | POA: Diagnosis not present

## 2022-05-29 ENCOUNTER — Ambulatory Visit: Payer: Medicare Other | Admitting: Podiatry

## 2022-05-29 ENCOUNTER — Encounter: Payer: Self-pay | Admitting: Podiatry

## 2022-05-29 VITALS — BP 121/71 | HR 60

## 2022-05-29 DIAGNOSIS — G5762 Lesion of plantar nerve, left lower limb: Secondary | ICD-10-CM

## 2022-05-31 ENCOUNTER — Ambulatory Visit
Admission: RE | Admit: 2022-05-31 | Discharge: 2022-05-31 | Disposition: A | Discharge status: Home / Self Care | Payer: Medicare Other | Source: Ambulatory Visit | Attending: Internal Medicine | Admitting: Internal Medicine

## 2022-05-31 DIAGNOSIS — Z1231 Encounter for screening mammogram for malignant neoplasm of breast: Secondary | ICD-10-CM | POA: Insufficient documentation

## 2022-05-31 LAB — HM MAMMOGRAPHY

## 2022-06-05 NOTE — Progress Notes (Signed)
Subjective:  Patient ID: Maureen Pena, female    DOB: 10-16-1955,  MRN: [id number]  Chief Complaint  Patient presents with   Follow-up    Patient is here for left foot follow-up.she states that she still has some sensation in the left foot.    66 y.o. female presents with the above complaint.  Patient presents with follow-up to left interspace second neuroma/capsulitis.  She states she is doing little bit better.  She still has some sensation of the left foot the injection did help some for a very little time.  However she is still feeling some.  She wanted to discuss treatment options   Review of Systems: Negative except as noted in the HPI. Denies N/V/F/Ch.  Past Medical History:  Diagnosis Date   Anxiety    Barrett's esophagus    Depression    Diverticulitis    History of chicken pox    Thyroid disease    hypothyroidism   UTI (urinary tract infection)     Current Outpatient Medications:    aspirin 81 MG EC tablet, Take 1 tablet (81 mg total) by mouth daily., Disp: 90 tablet, Rfl: 3   atorvastatin (LIPITOR) 20 MG tablet, Take 1 tablet (20 mg total) by mouth daily at 6 PM., Disp: 30 tablet, Rfl: 5   Cholecalciferol (VITAMIN D-3) 5000 units TABS, Take by mouth., Disp: , Rfl:    escitalopram (LEXAPRO) 5 MG tablet, Take 1 tablet (5 mg total) by mouth daily., Disp: 90 tablet, Rfl: 3   ezetimibe (ZETIA) 10 MG tablet, Take 1 tablet (10 mg total) by mouth daily., Disp: 90 tablet, Rfl: 3   levothyroxine (SYNTHROID) 100 MCG tablet, Take 1 30 minutes before meal in the am qd, Disp: 90 tablet, Rfl: 3   meloxicam (MOBIC) 15 MG tablet, , Disp: , Rfl:    nitroGLYCERIN (NITROSTAT) 0.4 MG SL tablet, Place 1 tablet (0.4 mg total) under the tongue every 5 (five) minutes x 3 doses as needed for chest pain., Disp: 30 tablet, Rfl: 2   varenicline (CHANTIX) 0.5 MG tablet, Take 1 tablet (0.5 mg total) by mouth 2 (two) times daily., Disp: 60 tablet, Rfl: 2  Social History   Tobacco Use   Smoking Status Every Day   Packs/day: 0.25   Types: Cigarettes  Smokeless Tobacco Never    No Known Allergies Objective:   Vitals:   05/29/22 0914  BP: 121/71  Pulse: 60   There is no height or weight on file to calculate BMI. Constitutional Well developed. Well nourished.  Vascular Dorsalis pedis pulses palpable bilaterally. Posterior tibial pulses palpable bilaterally. Capillary refill normal to all digits.  No cyanosis or clubbing noted. Pedal hair growth normal.  Neurologic Normal speech. Oriented to person, place, and time. Epicritic sensation to light touch grossly present bilaterally.  Dermatologic Nails well groomed and normal in appearance. No open wounds. No skin lesions.  Orthopedic: Pain on palpation to the left second interspace positive Mulder's click noted.  Positive [place] sign noted.  Some pain with range of motion of the metatarsophalangeal joint of the second therefore a capsulitis cannot be excluded   Radiographs: None Assessment:   No diagnosis found.  Plan:  Patient was evaluated and treated and all questions answered.  Left second interspace neuroma  -All questions and concerns were discussed with the patient in extensive detail. -Injection did give her some relief likely pointing towards neuroma. -At the moment she will continue to manage her shoe gear modifications.  I discussed shoe  gear modification extensive detail.  She would benefit from power steps as well.  Information was given in regards to power steps. -If there is no improvement we will discuss alcohol injection versus surgical excision   No follow-ups on file.

## 2022-10-21 ENCOUNTER — Other Ambulatory Visit: Payer: Self-pay | Admitting: Psychiatry

## 2022-10-21 DIAGNOSIS — F17200 Nicotine dependence, unspecified, uncomplicated: Secondary | ICD-10-CM

## 2022-11-16 ENCOUNTER — Ambulatory Visit: Payer: Medicare Other | Admitting: Psychiatry

## 2022-11-16 ENCOUNTER — Encounter: Payer: Self-pay | Admitting: Psychiatry

## 2022-11-16 VITALS — BP 134/84 | HR 64 | Temp 98.0°F | Ht 67.0 in | Wt 162.4 lb

## 2022-11-16 DIAGNOSIS — F3342 Major depressive disorder, recurrent, in full remission: Secondary | ICD-10-CM

## 2022-11-16 DIAGNOSIS — R5383 Other fatigue: Secondary | ICD-10-CM | POA: Diagnosis not present

## 2022-11-16 DIAGNOSIS — F17200 Nicotine dependence, unspecified, uncomplicated: Secondary | ICD-10-CM

## 2022-11-16 NOTE — Progress Notes (Signed)
BH MD OP Progress Note  11/16/2022 10:43 AM Maureen Pena  MRN:  [id number]  Chief Complaint:  Chief Complaint  Patient presents with   Follow-up   Depression   Medication Refill   Sleeping Problem   HPI: Maureen Pena is a-67year-old Caucasian female, divorced, retired, lives in [place], has a history of MDD, tobacco use disorder, hypothyroidism was evaluated in office today.  Patient today reports overall she is currently doing fairly well with regard to her mood symptoms.  She does not feel depressed or anxious.  She does feel 'bored ' at times.  She is planning to do something for herself this summer.  She continues to enjoy her dogs and currently has a 57-month-old foster.  Patient does struggle with fatigue.  She does not know what could be contributing to it.  She has not had her thyroid labs done in a long time.  She is also on a higher dosage of statin unknown if that is contributing to it.  Fatigue has been going on since the past several months.  She does report sleep is restless and even when she sleeps through the night she wakes up feeling tired.  Has not had a sleep study done in the past.  Patient denies any suicidality, homicidality or perceptual disturbances.  Patient was able to quit smoking.  She had a lot of side effects to Chantix including constipation, vivid dreams.  She however was able to take it for around 4 months which helped with smoking cessation.Reports she may have substituted cigarettes with sweets.  She is currently planning on cutting back on her sweets and following a good diet.  Patient denies any other concerns today.  Visit Diagnosis:    ICD-10-CM   1. MDD (major depressive disorder), recurrent, in full remission (HCC)  F33.42     2. Tobacco use disorder  F17.200     3. Fatigue, unspecified type  R53.83       Past Psychiatric History: I have reviewed past psychiatric history from progress note on 02/06/2018.  Past trials of  Lexapro, hydroxyzine.  Past Medical History:  Past Medical History:  Diagnosis Date   Anxiety    Barrett's esophagus    Depression    Diverticulitis    History of chicken pox    Thyroid disease    hypothyroidism   UTI (urinary tract infection)     Past Surgical History:  Procedure Laterality Date   ESOPHAGOGASTRODUODENOSCOPY (EGD) WITH PROPOFOL N/A 05/02/2018   Procedure: ESOPHAGOGASTRODUODENOSCOPY (EGD) WITH PROPOFOL;  Surgeon: Midge Minium, MD;  Location: [facility];  Service: Endoscopy;  Laterality: N/A;   LEFT HEART CATH AND CORONARY ANGIOGRAPHY N/A 08/20/2019   Procedure: LEFT HEART CATH AND CORONARY ANGIOGRAPHY;  Surgeon: Antonieta Iba, MD;  Location: [facility];  Service: Cardiovascular;  Laterality: N/A;   TUBAL LIGATION      Family Psychiatric History: I have reviewed family psychiatric history from progress note on 02/06/2018.  Family History:  Family History  Problem Relation Age of Onset   Depression Mother    Anxiety disorder Mother    Schizophrenia Mother    Paranoid behavior Mother    Stroke Mother    COPD Father    Early death Father    Hearing loss Father    Heart disease Father    CAD Father        s/p cabg x 3    Depression Brother    COPD Brother    Hypertension  Brother    Kidney disease Brother    Pulmonary fibrosis Brother        died 65 16-Aug-2020   Breast cancer Neg Hx     Social History: I have reviewed social history from progress note on 02/06/2018. Social History   Socioeconomic History   Marital status: Divorced    Spouse name: Not on file   Number of children: 2   Years of education: Not on file   Highest education level: Bachelor's degree (e.g., BA, AB, BS)  Occupational History    Comment: part time  Tobacco Use   Smoking status: Former    Packs/day: .25    Types: Cigarettes    Quit date: 07/14/2022    Years since quitting: 0.3   Smokeless tobacco: Never  Vaping Use   Vaping Use: Never used  Substance  and Sexual Activity   Alcohol use: Not Currently    Alcohol/week: 1.0 standard drink of alcohol    Types: 1 Glasses of wine per week   Drug use: Yes    Types: Marijuana   Sexual activity: Not Currently  Other Topics Concern   Not on file  Social History Narrative   Kids    No guns    Wears seat belt    Safe in relationship    Smoker    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 dogs and 1 cat   Lawyer    Divorced    Social Determinants of Health   Financial Resource Strain: Low Risk  (02/06/2018)   Overall Financial Resource Strain (CARDIA)    Difficulty of Paying Living Expenses: Not hard at all  Food Insecurity: No Food Insecurity (02/06/2018)   Hunger Vital Sign    Worried About Running Out of Food in the Last Year: Never true    Ran Out of Food in the Last Year: Never true  Transportation Needs: No Transportation Needs (02/06/2018)   PRAPARE - Administrator, Civil Service (Medical): No    Lack of Transportation (Non-Medical): No  Physical Activity: Insufficiently Active (02/06/2018)   Exercise Vital Sign    Days of Exercise per Week: 7 days    Minutes of Exercise per Session: 20 min  Stress: Stress Concern Present (02/06/2018)   [employer] of Occupational Health - Occupational Stress Questionnaire    Feeling of Stress : To some extent  Social Connections: Moderately Isolated (02/06/2018)   Social Connection and Isolation Panel [NHANES]    Frequency of Communication with Friends and Family: More than three times a week    Frequency of Social Gatherings with Friends and Family: Three times a week    Attends Religious Services: Never    Active Member of Clubs or Organizations: No    Attends Banker Meetings: Never    Marital Status: Divorced    Allergies: No Known Allergies  Metabolic Disorder Labs: Lab Results  Component Value Date   HGBA1C 5.4 10/12/2021   No results found for: "PROLACTIN" Lab Results  Component Value Date   CHOL 154 05/08/2022   TRIG  69 05/08/2022   HDL 70 05/08/2022   CHOLHDL 2.2 05/08/2022   VLDL 14 05/08/2022   LDLCALC 70 05/08/2022   LDLCALC 66 10/12/2021   Lab Results  Component Value Date   TSH 2.26 10/12/2021   TSH 0.78 04/04/2021    Therapeutic Level Labs: No results found for: "LITHIUM" No results found for: "VALPROATE" No results found for: "CBMZ"  Current Medications: Current Outpatient Medications  Medication Sig Dispense Refill   aspirin 81 MG EC tablet Take 1 tablet (81 mg total) by mouth daily. 90 tablet 3   atorvastatin (LIPITOR) 20 MG tablet Take 1 tablet (20 mg total) by mouth daily at 6 PM. 30 tablet 5   Cholecalciferol (VITAMIN D-3) 5000 units TABS Take 4,000 Units by mouth.     escitalopram (LEXAPRO) 5 MG tablet Take 1 tablet (5 mg total) by mouth daily. 90 tablet 3   ezetimibe (ZETIA) 10 MG tablet Take 1 tablet (10 mg total) by mouth daily. 90 tablet 3   levothyroxine (SYNTHROID) 100 MCG tablet Take 1 30 minutes before meal in the am qd 90 tablet 3   nitroGLYCERIN (NITROSTAT) 0.4 MG SL tablet Place 1 tablet (0.4 mg total) under the tongue every 5 (five) minutes x 3 doses as needed for chest pain. 30 tablet 2   No current facility-administered medications for this visit.     Musculoskeletal: Strength & Muscle Tone: within normal limits Gait & Station: normal Patient leans: N/A  Psychiatric Specialty Exam: Review of Systems  Constitutional:  Positive for fatigue.  Psychiatric/Behavioral:  Positive for sleep disturbance.     Blood pressure 134/84, pulse 64, temperature 98 F (36.7 C), temperature source Skin, height 5\' 7"  (1.702 m), weight 162 lb 6.4 oz (73.7 kg).Body mass index is 25.44 kg/m.  General Appearance: Fairly Groomed  Eye Contact:  Fair  Speech:  Clear and Coherent  Volume:  Normal  Mood:  Euthymic  Affect:  Appropriate  Thought Process:  Goal Directed and Descriptions of Associations: Intact  Orientation:  Full (Time, Place, and Person)  Thought Content:  Logical   Suicidal Thoughts:  No  Homicidal Thoughts:  No  Memory:  Immediate;   Fair Recent;   Fair Remote;   Fair  Judgement:  Fair  Insight:  Fair  Psychomotor Activity:  Normal  Concentration:  Concentration: Fair and Attention Span: Fair  Recall:  [employer] of Knowledge: Fair  Language: Fair  Akathisia:  No  Handed:  Right  AIMS (if indicated): not done  Assets:  Communication Skills Desire for Improvement Housing Social Support  ADL's:  Intact  Cognition: WNL  Sleep:   restless   Screenings: Geneticist, molecular Office Visit from 11/15/2021 in [facility]  AIMS Total Score 0      GAD-7    Flowsheet Row Office Visit from 11/16/2022 in [facility] Office Visit from 05/17/2022 in [facility] Video Visit from 11/17/2020 in [facility] Office Visit from 12/24/2019 in [facility] Video Visit from 08/27/2019 in [facility]  Total GAD-7 Score 1 0 1 0 0      PHQ2-9    Flowsheet Row Office Visit from 11/16/2022 in [facility] Office Visit from 05/17/2022 in [facility] Office Visit from 02/14/2022 in [facility] Visit from 11/15/2021 in [facility] Video Visit from 05/17/2021 in [facility]  PHQ-2 Total Score 1 1 0 0 0  PHQ-9 Total Score 2 3 -- 1 1      Flowsheet Row Office Visit from 11/16/2022 in [facility] Office Visit from 05/17/2022 in [facility] Office Visit from  11/15/2021 in [facility]  C-SSRS RISK CATEGORY No  Risk No Risk No Risk        Assessment and Plan: Skylea Meine is a 67 year old Caucasian female, divorced, lives in [place], has a history of depression, GERD, hypothyroidism was evaluated in office today.  Patient with recent worsening of fatigue, otherwise mood symptoms stable.  Plan as noted below.  Plan MDD in remission Lexapro 5 mg p.o. daily  Tobacco use disorder-in remission Patient completed Chantix 0.5 mg. Patient has quit smoking.  Fatigue-patient will need repeat TSH labs, vitamin B12, vitamin D, also advised to follow-up with cardiology regarding her statin.  Will consider a sleep study in the future.  Follow-up in clinic in 5 to 6 months or sooner if needed.  Collaboration of Care: Collaboration of Care: Other patient encouraged to follow-up with cardiology and primary care provider.  She does have upcoming appointment with Dr. Clent Ridges.  She will reach out to cardiology.  Patient/Guardian was advised Release of Information must be obtained prior to any record release in order to collaborate their care with an outside provider. Patient/Guardian was advised if they have not already done so to contact the registration department to sign all necessary forms in order for [place] to release information regarding their care.   Consent: Patient/Guardian gives verbal consent for treatment and assignment of benefits for services provided during this visit. Patient/Guardian expressed understanding and agreed to proceed.   This note was generated in part or whole with voice recognition software. Voice recognition is usually quite accurate but there are transcription errors that can and very often do occur. I apologize for any typographical errors that were not detected and corrected.    Jomarie Longs, MD 11/16/2022, 10:43 AM

## 2022-11-16 NOTE — Patient Instructions (Signed)
Calorie Counting for Weight Loss Calories are units of energy. Your body needs a certain number of calories from food to keep going throughout the day. When you eat or drink more calories than your body needs, your body stores the extra calories mostly as fat. When you eat or drink fewer calories than your body needs, your body burns fat to get the energy it needs. Calorie counting means keeping track of how many calories you eat and drink each day. Calorie counting can be helpful if you need to lose weight. If you eat fewer calories than your body needs, you should lose weight. Ask your health care provider what a healthy weight is for you. For calorie counting to work, you will need to eat the right number of calories each day to lose a healthy amount of weight per week. A dietitian can help you figure out how many calories you need in a day and will suggest ways to reach your calorie goal. A healthy amount of weight to lose each week is usually 1-2 lb (0.5-0.9 kg). This usually means that your daily calorie intake should be reduced by 500-750 calories. Eating 1,200-1,500 calories a day can help most women lose weight. Eating 1,500-1,800 calories a day can help most men lose weight. What do I need to know about calorie counting? Work with your health care provider or dietitian to determine how many calories you should get each day. To meet your daily calorie goal, you will need to: Find out how many calories are in each food that you would like to eat. Try to do this before you eat. Decide how much of the food you plan to eat. Keep a food log. Do this by writing down what you ate and how many calories it had. To successfully lose weight, it is important to balance calorie counting with a healthy lifestyle that includes regular activity. Where do I find calorie information?  The number of calories in a food can be found on a Nutrition Facts label. If a food does not have a Nutrition Facts label, try  to look up the calories online or ask your dietitian for help. Remember that calories are listed per serving. If you choose to have more than one serving of a food, you will have to multiply the calories per serving by the number of servings you plan to eat. For example, the label on a package of bread might say that a serving size is 1 slice and that there are 90 calories in a serving. If you eat 1 slice, you will have eaten 90 calories. If you eat 2 slices, you will have eaten 180 calories. How do I keep a food log? After each time that you eat, record the following in your food log as soon as possible: What you ate. Be sure to include toppings, sauces, and other extras on the food. How much you ate. This can be measured in cups, ounces, or number of items. How many calories were in each food and drink. The total number of calories in the food you ate. Keep your food log near you, such as in a pocket-sized notebook or on an app or website on your mobile phone. Some programs will calculate calories for you and show you how many calories you have left to meet your daily goal. What are some portion-control tips? Know how many calories are in a serving. This will help you know how many servings you can have of a certain   food. Use a measuring cup to measure serving sizes. You could also try weighing out portions on a kitchen scale. With time, you will be able to estimate serving sizes for some foods. Take time to put servings of different foods on your favorite plates or in your favorite bowls and cups so you know what a serving looks like. Try not to eat straight from a food's packaging, such as from a bag or box. Eating straight from the package makes it hard to see how much you are eating and can lead to overeating. Put the amount you would like to eat in a cup or on a plate to make sure you are eating the right portion. Use smaller plates, glasses, and bowls for smaller portions and to prevent  overeating. Try not to multitask. For example, avoid watching TV or using your computer while eating. If it is time to eat, sit down at a table and enjoy your food. This will help you recognize when you are full. It will also help you be more mindful of what and how much you are eating. What are tips for following this plan? Reading food labels Check the calorie count compared with the serving size. The serving size may be smaller than what you are used to eating. Check the source of the calories. Try to choose foods that are high in protein, fiber, and vitamins, and low in saturated fat, trans fat, and sodium. Shopping Read nutrition labels while you shop. This will help you make healthy decisions about which foods to buy. Pay attention to nutrition labels for low-fat or fat-free foods. These foods sometimes have the same number of calories or more calories than the full-fat versions. They also often have added sugar, starch, or salt to make up for flavor that was removed with the fat. Make a grocery list of lower-calorie foods and stick to it. Cooking Try to cook your favorite foods in a healthier way. For example, try baking instead of frying. Use low-fat dairy products. Meal planning Use more fruits and vegetables. One-half of your plate should be fruits and vegetables. Include lean proteins, such as chicken, turkey, and fish. Lifestyle Each week, aim to do one of the following: 150 minutes of moderate exercise, such as walking. 75 minutes of vigorous exercise, such as running. General information Know how many calories are in the foods you eat most often. This will help you calculate calorie counts faster. Find a way of tracking calories that works for you. Get creative. Try different apps or programs if writing down calories does not work for you. What foods should I eat?  Eat nutritious foods. It is better to have a nutritious, high-calorie food, such as an avocado, than a food with  few nutrients, such as a bag of potato chips. Use your calories on foods and drinks that will fill you up and will not leave you hungry soon after eating. Examples of foods that fill you up are nuts and nut butters, vegetables, lean proteins, and high-fiber foods such as whole grains. High-fiber foods are foods with more than 5 g of fiber per serving. Pay attention to calories in drinks. Low-calorie drinks include water and unsweetened drinks. The items listed above may not be a complete list of foods and beverages you can eat. Contact a dietitian for more information. What foods should I limit? Limit foods or drinks that are not good sources of vitamins, minerals, or protein or that are high in unhealthy fats. These   include: Candy. Other sweets. Sodas, specialty coffee drinks, alcohol, and juice. The items listed above may not be a complete list of foods and beverages you should avoid. Contact a dietitian for more information. How do I count calories when eating out? Pay attention to portions. Often, portions are much larger when eating out. Try these tips to keep portions smaller: Consider sharing a meal instead of getting your own. If you get your own meal, eat only half of it. Before you start eating, ask for a container and put half of your meal into it. When available, consider ordering smaller portions from the menu instead of full portions. Pay attention to your food and drink choices. Knowing the way food is cooked and what is included with the meal can help you eat fewer calories. If calories are listed on the menu, choose the lower-calorie options. Choose dishes that include vegetables, fruits, whole grains, low-fat dairy products, and lean proteins. Choose items that are boiled, broiled, grilled, or steamed. Avoid items that are buttered, battered, fried, or served with cream sauce. Items labeled as crispy are usually fried, unless stated otherwise. Choose water, low-fat milk,  unsweetened iced tea, or other drinks without added sugar. If you want an alcoholic beverage, choose a lower-calorie option, such as a glass of wine or light beer. Ask for dressings, sauces, and syrups on the side. These are usually high in calories, so you should limit the amount you eat. If you want a salad, choose a garden salad and ask for grilled meats. Avoid extra toppings such as bacon, cheese, or fried items. Ask for the dressing on the side, or ask for olive oil and vinegar or lemon to use as dressing. Estimate how many servings of a food you are given. Knowing serving sizes will help you be aware of how much food you are eating at restaurants. Where to find more information Centers for Disease Control and Prevention: www.cdc.gov U.[place]. Department of Agriculture: myplate.gov Summary Calorie counting means keeping track of how many calories you eat and drink each day. If you eat fewer calories than your body needs, you should lose weight. A healthy amount of weight to lose per week is usually 1-2 lb (0.5-0.9 kg). This usually means reducing your daily calorie intake by 500-750 calories. The number of calories in a food can be found on a Nutrition Facts label. If a food does not have a Nutrition Facts label, try to look up the calories online or ask your dietitian for help. Use smaller plates, glasses, and bowls for smaller portions and to prevent overeating. Use your calories on foods and drinks that will fill you up and not leave you hungry shortly after a meal. This information is not intended to replace advice given to you by your health care provider. Make sure you discuss any questions you have with your health care provider. Document Revised: 07/09/2019 Document Reviewed: 07/09/2019 Elsevier Patient Education  2023 Elsevier Inc.  

## 2022-12-06 ENCOUNTER — Telehealth: Payer: Self-pay | Admitting: Cardiovascular Disease

## 2022-12-06 NOTE — Telephone Encounter (Signed)
   Pre-operative Risk Assessment    Patient Name: [name]  DOB: 1955-12-05 MRN: [id number]     Request for Surgical Clearance    Procedure:  Cleaning  Date of Surgery:  Clearance TBD                                 Surgeon:  Dr. [place] Thomas Surgeon's Group or Practice Name:  [facility]  Phone number:  [phone] Fax number:  [phone]   Type of Clearance Requested:   - Medical    Type of Anesthesia:  Local    Additional requests/questions:    Signed, Narda Amber   12/06/2022, 11:55 AM

## 2022-12-06 NOTE — Telephone Encounter (Signed)
   Patient Name: Maureen Pena  DOB: 1955-07-24 MRN: [id number]  Primary Cardiologist: Julien Nordmann, MD  Chart reviewed as part of pre-operative protocol coverage.   Simple dental extractions (i.e. 1-2 teeth) are considered low risk procedures per guidelines and generally do not require any specific cardiac clearance. It is also generally accepted that for simple extractions and dental cleanings, there is no need to interrupt blood thinner therapy.   SBE prophylaxis  is not required for the patient from a cardiac standpoint.  I will route this recommendation to the requesting party via Epic fax function and remove from pre-op pool.  Please call with questions.  Napoleon Form, Leodis Rains, NP 12/06/2022, 12:01 PM

## 2022-12-16 ENCOUNTER — Other Ambulatory Visit: Payer: Self-pay | Admitting: Cardiovascular Disease

## 2022-12-16 DIAGNOSIS — E785 Hyperlipidemia, unspecified: Secondary | ICD-10-CM

## 2022-12-16 DIAGNOSIS — I2510 Atherosclerotic heart disease of native coronary artery without angina pectoris: Secondary | ICD-10-CM

## 2022-12-26 ENCOUNTER — Other Ambulatory Visit (HOSPITAL_COMMUNITY): Payer: Self-pay | Admitting: Psychiatry

## 2022-12-26 DIAGNOSIS — F17200 Nicotine dependence, unspecified, uncomplicated: Secondary | ICD-10-CM

## 2022-12-26 MED ORDER — VARENICLINE TARTRATE 0.5 MG PO TABS: 0.5000 mg | ORAL_TABLET | Freq: Two times a day (BID) | ORAL | 2 refills | Status: AC

## 2023-01-02 ENCOUNTER — Other Ambulatory Visit: Payer: Self-pay | Admitting: Family Medicine

## 2023-01-02 ENCOUNTER — Telehealth: Payer: Self-pay | Admitting: Psychiatry

## 2023-01-02 ENCOUNTER — Encounter: Payer: Self-pay | Admitting: Family Medicine

## 2023-01-02 DIAGNOSIS — E039 Hypothyroidism, unspecified: Secondary | ICD-10-CM

## 2023-01-02 DIAGNOSIS — E785 Hyperlipidemia, unspecified: Secondary | ICD-10-CM

## 2023-01-02 NOTE — Telephone Encounter (Signed)
A prescription for chantix was already sent out by Dr. Tenny Craw on 12/26/2022. Will have staff contact patient to discuss.  I also tried to contact this patient had to leave a voicemail.

## 2023-01-02 NOTE — Telephone Encounter (Signed)
Patient called stating she sent a my chart message last week. Patient wants to now if she needs to have more treatment to help quit smoking. She states wants to know if she should continue Chantix medication.Her next appointment is 05/15/2023.-Please advise

## 2023-01-03 NOTE — Telephone Encounter (Signed)
Called spoke to patient inform her of the message she voiced understanding

## 2023-01-03 NOTE — Telephone Encounter (Signed)
Thank you :)

## 2023-01-09 ENCOUNTER — Telehealth: Payer: Self-pay | Admitting: Family Medicine

## 2023-01-09 ENCOUNTER — Encounter (INDEPENDENT_AMBULATORY_CARE_PROVIDER_SITE_OTHER): Payer: Self-pay

## 2023-01-09 NOTE — Telephone Encounter (Signed)
Patient need lab orders.

## 2023-01-15 ENCOUNTER — Other Ambulatory Visit: Payer: Self-pay | Admitting: Family Medicine

## 2023-01-15 DIAGNOSIS — E039 Hypothyroidism, unspecified: Secondary | ICD-10-CM

## 2023-01-15 DIAGNOSIS — E785 Hyperlipidemia, unspecified: Secondary | ICD-10-CM

## 2023-01-17 ENCOUNTER — Other Ambulatory Visit (INDEPENDENT_AMBULATORY_CARE_PROVIDER_SITE_OTHER): Payer: Medicare Other

## 2023-01-17 DIAGNOSIS — E039 Hypothyroidism, unspecified: Secondary | ICD-10-CM

## 2023-01-17 DIAGNOSIS — E785 Hyperlipidemia, unspecified: Secondary | ICD-10-CM | POA: Diagnosis not present

## 2023-01-17 LAB — COMPREHENSIVE METABOLIC PANEL
ALT: 36 U/L — ABNORMAL HIGH (ref 0–35)
AST: 32 U/L (ref 0–37)
Albumin: 4.2 g/dL (ref 3.5–5.2)
Alkaline Phosphatase: 88 U/L (ref 39–117)
BUN: 17 mg/dL (ref 6–23)
CO2: 29 mEq/L (ref 19–32)
Calcium: 9.8 mg/dL (ref 8.4–10.5)
Chloride: 106 mEq/L (ref 96–112)
Creatinine, Ser: 0.96 mg/dL (ref 0.40–1.20)
GFR: 62.04 mL/min (ref >60.00)
Glucose, Bld: 92 mg/dL (ref 70–99)
Potassium: 4.9 mEq/L (ref 3.5–5.1)
Sodium: 142 mEq/L (ref 135–145)
Total Bilirubin: 0.6 mg/dL (ref 0.2–1.2)
Total Protein: 6.3 g/dL (ref 6.0–8.3)

## 2023-01-17 LAB — LIPID PANEL
Cholesterol: 142 mg/dL (ref 0–200)
HDL: 60.3 mg/dL (ref >39.00)
LDL Cholesterol: 64 mg/dL (ref 0–99)
NonHDL: 81.53
Total CHOL/HDL Ratio: 2
Triglycerides: 87 mg/dL (ref 0.0–149.0)
VLDL: 17.4 mg/dL (ref 0.0–40.0)

## 2023-01-17 LAB — TSH: TSH: 1 u[IU]/mL (ref 0.35–5.50)

## 2023-01-22 ENCOUNTER — Encounter: Payer: Self-pay | Admitting: Family Medicine

## 2023-01-22 ENCOUNTER — Ambulatory Visit: Payer: Medicare Other | Admitting: Family Medicine

## 2023-01-22 VITALS — BP 118/70 | HR 67 | Temp 98.0°F | Resp 16 | Ht 66.0 in | Wt 159.5 lb

## 2023-01-22 DIAGNOSIS — Z1231 Encounter for screening mammogram for malignant neoplasm of breast: Secondary | ICD-10-CM

## 2023-01-22 DIAGNOSIS — Z1211 Encounter for screening for malignant neoplasm of colon: Secondary | ICD-10-CM

## 2023-01-22 DIAGNOSIS — E785 Hyperlipidemia, unspecified: Secondary | ICD-10-CM

## 2023-01-22 DIAGNOSIS — F17200 Nicotine dependence, unspecified, uncomplicated: Secondary | ICD-10-CM | POA: Diagnosis not present

## 2023-01-22 DIAGNOSIS — E039 Hypothyroidism, unspecified: Secondary | ICD-10-CM | POA: Diagnosis not present

## 2023-01-22 DIAGNOSIS — Z122 Encounter for screening for malignant neoplasm of respiratory organs: Secondary | ICD-10-CM

## 2023-01-22 DIAGNOSIS — Z780 Asymptomatic menopausal state: Secondary | ICD-10-CM

## 2023-01-22 DIAGNOSIS — F39 Unspecified mood [affective] disorder: Secondary | ICD-10-CM

## 2023-01-22 MED ORDER — LEVOTHYROXINE SODIUM 100 MCG PO TABS: ORAL_TABLET | ORAL | 3 refills | Status: DC

## 2023-01-22 NOTE — Progress Notes (Signed)
SUBJECTIVE:   Chief Complaint  Patient presents with   Establish Care    Transferring from [facility]   HPI Presents to clinic to transfer care  No acute concerns  Hypothyroid Asymptomatic.  Takes Levothyroxine 100 mcg daily.  Tolerating medication well. Recent TSH wnl. Requesting refill  Mood Disorder/Tobacco use Doing well on current medications.  Takes Lexapro  5 mg daily and Chantix 0.5 mg BID.  Follows with Dr Elna Breslow, Denies SI/HI  Hyperlipidemia Tolerating Lipitor 20 mg daily and Zetia 10 mg daily.  Recent LDL 64 Follows with Cardiology   PERTINENT PMH / PSH: History of STEMI History of Barrett's Esophagus Hypothyroid HTN HLD   OBJECTIVE:  BP 118/70   Pulse 67   Temp 98 F (36.7 C)   Resp 16   Ht 5\' 6"  (1.676 m)   Wt 159 lb 8 oz (72.3 kg)   SpO2 98%   BMI 25.74 kg/m    Physical Exam Vitals reviewed.  Constitutional:      General: She is not in acute distress.    Appearance: She is not ill-appearing.  HENT:     Head: Normocephalic.     Right Ear: Tympanic membrane, ear canal and external ear normal.     Left Ear: Tympanic membrane, ear canal and external ear normal.     Nose: Nose normal.     Mouth/Throat:     Mouth: Mucous membranes are moist.  Eyes:     Extraocular Movements: Extraocular movements intact.     Conjunctiva/sclera: Conjunctivae normal.     Pupils: Pupils are equal, round, and reactive to light.  Neck:     Thyroid: No thyromegaly or thyroid tenderness.     Vascular: No carotid bruit.  Cardiovascular:     Rate and Rhythm: Normal rate and regular rhythm.     Pulses: Normal pulses.     Heart sounds: Normal heart sounds.  Pulmonary:     Effort: Pulmonary effort is normal.     Breath sounds: Normal breath sounds.  Abdominal:     General: Bowel sounds are normal. There is no distension.     Palpations: Abdomen is soft.     Tenderness: There is no abdominal tenderness. There is no right CVA tenderness, left CVA tenderness,  guarding or rebound.  Musculoskeletal:        General: Normal range of motion.     Cervical back: Normal range of motion.     Right lower leg: No edema.     Left lower leg: No edema.  Lymphadenopathy:     Cervical: No cervical adenopathy.  Skin:    Capillary Refill: Capillary refill takes less than 2 seconds.  Neurological:     General: No focal deficit present.     Mental Status: She is alert and oriented to person, place, and time. Mental status is at baseline.     Motor: No weakness.  Psychiatric:        Mood and Affect: Mood normal.        Behavior: Behavior normal.        Thought Content: Thought content normal.        Judgment: Judgment normal.        01/22/2023    3:10 PM 11/16/2022   10:41 AM 05/17/2022   11:20 AM 02/14/2022    8:26 AM 11/15/2021   10:25 AM  Depression screen PHQ 2/9  Decreased Interest 0   0   Down, Depressed, Hopeless 0   0  PHQ - 2 Score 0   0   Altered sleeping 0      Tired, decreased energy 1      Change in appetite 0      Feeling bad or failure about yourself  0      Trouble concentrating 0      Moving slowly or fidgety/restless 0      Suicidal thoughts 0      PHQ-9 Score 1      Difficult doing work/chores Not difficult at all         Information is confidential and restricted. Go to Review Flowsheets to unlock data.      01/22/2023    3:10 PM 11/16/2022   10:41 AM 05/17/2022   11:20 AM 11/17/2020   10:18 AM  GAD 7 : Generalized Anxiety Score  Nervous, Anxious, on Edge 0     Control/stop worrying 0     Worry too much - different things 0     Trouble relaxing 0     Restless 1     Easily annoyed or irritable 0     Afraid - awful might happen 0     Total GAD 7 Score 1     Anxiety Difficulty Not difficult at all        Information is confidential and restricted. Go to Review Flowsheets to unlock data.    ASSESSMENT/PLAN:  Screening for lung cancer Assessment & Plan: 37.5 year pack smoking history  Orders: -     Ambulatory Referral  for Lung Cancer Scre  Hypothyroidism, unspecified type Assessment & Plan: Chronic Recent TSH 1.00 Asymptomatic Refill Levothyroxine 100 mcg daily Annual TSH  Orders: -     Levothyroxine Sodium; Take 1 30 minutes before meal in the am qd  Dispense: 90 tablet; Refill: 3  Breast cancer screening by mammogram -     3D Screening Mammogram, Left and Right; Future  Postmenopausal estrogen deficiency -     DG Bone Density; Future  Colon cancer screening -     Cologuard; Future  Mood disorder [facility]) Assessment & Plan: Chronic. PHQ9/GAD scores low Denies SI/HI Tolerating Lexapro and Chantix Continue to follow up with psychiatry, Dr Elna Breslow     Tobacco use disorder Assessment & Plan: On Chantix Follows with psychiatry LDCT referral sent today, 37.5 year pack smoking history   Hyperlipidemia, unspecified hyperlipidemia type Assessment & Plan: Chronic Taking Lipitor 20 mg and Zetia 10 mg daily Recent LDL at goal Follows with Cardiology    PDMP reviewed  Return if symptoms worsen or fail to improve, for PCP.  Dana Allan, MD

## 2023-01-22 NOTE — Patient Instructions (Signed)
It was a pleasure meeting you today. Thank you for allowing me to take part in your health care.  Our goals for today as we discussed include:  Referral sent for Mammogram and Dexa scan. Please call to schedule appointment. [facility] [address] [place], [place] [address] [phone]    Referral sent for Lung cancer screening  Recommend Pneumonia 20 Vaccination.    Schedule Medicare Annual Wellness Visit   If you have any questions or concerns, please do not hesitate to call the office at [phone].  I look forward to our next visit and until then take care and stay safe.  Regards,   Dana Allan, MD   [facility]

## 2023-02-09 ENCOUNTER — Encounter: Payer: Self-pay | Admitting: Family Medicine

## 2023-02-09 DIAGNOSIS — Z1211 Encounter for screening for malignant neoplasm of colon: Secondary | ICD-10-CM | POA: Insufficient documentation

## 2023-02-09 DIAGNOSIS — Z122 Encounter for screening for malignant neoplasm of respiratory organs: Secondary | ICD-10-CM | POA: Insufficient documentation

## 2023-02-09 DIAGNOSIS — Z780 Asymptomatic menopausal state: Secondary | ICD-10-CM | POA: Insufficient documentation

## 2023-02-09 DIAGNOSIS — Z1231 Encounter for screening mammogram for malignant neoplasm of breast: Secondary | ICD-10-CM | POA: Insufficient documentation

## 2023-02-09 NOTE — Assessment & Plan Note (Signed)
Chronic Recent TSH 1.00 Asymptomatic Refill Levothyroxine 100 mcg daily Annual TSH

## 2023-02-09 NOTE — Assessment & Plan Note (Signed)
Chronic Taking Lipitor 20 mg and Zetia 10 mg daily Recent LDL at goal Follows with Cardiology

## 2023-02-09 NOTE — Assessment & Plan Note (Addendum)
On Chantix Follows with psychiatry LDCT referral sent today, 37.5 year pack smoking history

## 2023-02-09 NOTE — Assessment & Plan Note (Signed)
Chronic. PHQ9/GAD scores low Denies SI/HI Tolerating Lexapro and Chantix Continue to follow up with psychiatry, Dr Elna Breslow

## 2023-02-09 NOTE — Assessment & Plan Note (Signed)
37.5 year pack smoking history

## 2023-02-18 ENCOUNTER — Other Ambulatory Visit: Payer: Self-pay

## 2023-02-18 DIAGNOSIS — E785 Hyperlipidemia, unspecified: Secondary | ICD-10-CM

## 2023-02-18 DIAGNOSIS — I2510 Atherosclerotic heart disease of native coronary artery without angina pectoris: Secondary | ICD-10-CM

## 2023-02-18 MED ORDER — EZETIMIBE 10 MG PO TABS: 10.0000 mg | ORAL_TABLET | Freq: Every day | ORAL | 0 refills | Status: DC

## 2023-02-18 NOTE — Telephone Encounter (Signed)
Requested Prescriptions   Signed Prescriptions Disp Refills   ezetimibe (ZETIA) 10 MG tablet 90 tablet 0    Sig: Take 1 tablet (10 mg total) by mouth daily. PLEASE CALL OFFICE TO SCHEDULE APPOINTMENT PRIOR TO NEXT REFILL    Authorizing Provider: Antonieta Iba    Ordering User: Guerry Minors

## 2023-03-15 ENCOUNTER — Other Ambulatory Visit: Payer: Self-pay | Admitting: Cardiovascular Disease

## 2023-03-15 DIAGNOSIS — E785 Hyperlipidemia, unspecified: Secondary | ICD-10-CM

## 2023-03-15 DIAGNOSIS — I2510 Atherosclerotic heart disease of native coronary artery without angina pectoris: Secondary | ICD-10-CM

## 2023-04-10 ENCOUNTER — Ambulatory Visit: Payer: Medicare Other | Admitting: Dermatology

## 2023-04-26 ENCOUNTER — Ambulatory Visit: Payer: Medicare Other | Admitting: Family Medicine

## 2023-04-26 ENCOUNTER — Encounter: Payer: Medicare Other | Admitting: Family Medicine

## 2023-04-26 ENCOUNTER — Encounter: Payer: Self-pay | Admitting: Family Medicine

## 2023-04-26 VITALS — BP 130/70 | HR 70 | Temp 98.4°F | Resp 16 | Ht 66.5 in | Wt 159.0 lb

## 2023-04-26 DIAGNOSIS — Z1211 Encounter for screening for malignant neoplasm of colon: Secondary | ICD-10-CM | POA: Diagnosis not present

## 2023-04-26 DIAGNOSIS — F3342 Major depressive disorder, recurrent, in full remission: Secondary | ICD-10-CM

## 2023-04-26 DIAGNOSIS — Z23 Encounter for immunization: Secondary | ICD-10-CM

## 2023-04-26 DIAGNOSIS — I2510 Atherosclerotic heart disease of native coronary artery without angina pectoris: Secondary | ICD-10-CM

## 2023-04-26 DIAGNOSIS — Z0000 Encounter for general adult medical examination without abnormal findings: Secondary | ICD-10-CM | POA: Diagnosis not present

## 2023-04-26 MED ORDER — NITROGLYCERIN 0.4 MG SL SUBL: 0.4000 mg | SUBLINGUAL_TABLET | SUBLINGUAL | 2 refills | Status: AC | PRN

## 2023-04-26 NOTE — Progress Notes (Signed)
Subjective:    Maureen Pena is a 67 y.o. female who presents for a Welcome to Medicare exam.         Objective:    Today's Vitals   04/26/23 0817  BP: 130/70  Pulse: 70  Resp: 16  Temp: 98.4 F (36.9 C)  SpO2: 98%  Weight: 159 lb (72.1 kg)  Height: 5' 6.5" (1.689 m)  Body mass index is 25.28 kg/m.  Medications Outpatient Encounter Medications as of 04/26/2023  Medication Sig   aspirin 81 MG EC tablet Take 1 tablet (81 mg total) by mouth daily.   atorvastatin (LIPITOR) 20 MG tablet TAKE ONE TABLET BY MOUTH EVERY EVENING AT 6:00PM. PLEASE CALL [phone] TO SCHEDULE YEARLY VISIT TO RECEIVE FURTHER REFILLS. THANK YOU.   Cholecalciferol (VITAMIN D-3) 5000 units TABS Take 4,000 Units by mouth.   escitalopram (LEXAPRO) 5 MG tablet Take 1 tablet (5 mg total) by mouth daily.   ezetimibe (ZETIA) 10 MG tablet Take 1 tablet (10 mg total) by mouth daily. PLEASE CALL OFFICE TO SCHEDULE APPOINTMENT PRIOR TO NEXT REFILL   levothyroxine (SYNTHROID) 100 MCG tablet Take 1 30 minutes before meal in the am qd   nitroGLYCERIN (NITROSTAT) 0.4 MG SL tablet Place 1 tablet (0.4 mg total) under the tongue every 5 (five) minutes x 3 doses as needed for chest pain.   [DISCONTINUED] nitroGLYCERIN (NITROSTAT) 0.4 MG SL tablet Place 1 tablet (0.4 mg total) under the tongue every 5 (five) minutes x 3 doses as needed for chest pain. (Patient not taking: Reported on 04/26/2023)   No facility-administered encounter medications on file as of 04/26/2023.     History: Past Medical History:  Diagnosis Date   Anxiety    Depression    Diverticulitis    History of chicken pox    Thyroid disease    hypothyroidism   UTI (urinary tract infection)    Past Surgical History:  Procedure Laterality Date   ESOPHAGOGASTRODUODENOSCOPY (EGD) WITH PROPOFOL N/A 05/02/2018   Procedure: ESOPHAGOGASTRODUODENOSCOPY (EGD) WITH PROPOFOL;  Surgeon: Midge Minium, MD;  Location: [facility];  Service:  Endoscopy;  Laterality: N/A;   LEFT HEART CATH AND CORONARY ANGIOGRAPHY N/A 08/20/2019   Procedure: LEFT HEART CATH AND CORONARY ANGIOGRAPHY;  Surgeon: Antonieta Iba, MD;  Location: [facility];  Service: Cardiovascular;  Laterality: N/A;   TUBAL LIGATION      Family History  Problem Relation Age of Onset   Depression Mother    Anxiety disorder Mother    Schizophrenia Mother    Paranoid behavior Mother    Stroke Mother    COPD Father    Early death Father    Hearing loss Father    Heart disease Father    CAD Father        s/p cabg x 3    Depression Brother    COPD Brother    Hypertension Brother    Kidney disease Brother    Pulmonary fibrosis Brother        died 34 2020-07-27   Breast cancer Neg Hx    Social History   Occupational History    Comment: part time  Tobacco Use   Smoking status: Former    Current packs/day: 0.00    Types: Cigarettes    Quit date: 07/14/2022    Years since quitting: 0.8   Smokeless tobacco: Never  Vaping Use   Vaping status: Never Used  Substance and Sexual Activity   Alcohol use: Not Currently  Alcohol/week: 1.0 standard drink of alcohol    Types: 1 Glasses of wine per week   Drug use: Yes    Types: Marijuana   Sexual activity: Not Currently    Tobacco Counseling Counseling given: Yes   Immunizations and Health Maintenance Immunization History  Administered Date(s) Administered   Influenza,inj,Quad PF,6+ Mos 04/11/2018, 04/20/2019, 07/08/2020, 02/14/2022   Influenza-Unspecified 03/21/2021, 03/07/2023   PFIZER(Purple Top)SARS-COV-2 Vaccination 08/28/2019, 09/23/2019, 04/11/2020, 03/07/2023   PNEUMOCOCCAL CONJUGATE-20 04/26/2023   Pfizer Covid-19 Vaccine Bivalent Booster 79yrs & up 03/21/2021   Pneumococcal Conjugate-13 04/14/2021   Tdap 03/07/2009, 03/08/2020   Zoster Recombinant(Shingrix) 05/11/2021, 08/11/2021   Health Maintenance Due  Topic Date Due   Fecal DNA (Cologuard)  Never done   DEXA SCAN  Never done    COVID-19 Vaccine (6 - 2023-24 season) 05/02/2023    Activities of Daily Living     No data to display          Physical Exam   Physical Exam Constitutional:      General: She is not in acute distress.    Appearance: Normal appearance.  HENT:     Head: Normocephalic.     Right Ear: Tympanic membrane, ear canal and external ear normal.     Left Ear: Tympanic membrane, ear canal and external ear normal.     Nose: Nose normal.     Mouth/Throat:     Mouth: Mucous membranes are moist.  Eyes:     Extraocular Movements: Extraocular movements intact.     Conjunctiva/sclera: Conjunctivae normal.     Pupils: Pupils are equal, round, and reactive to light.  Cardiovascular:     Rate and Rhythm: Normal rate.     Pulses: Normal pulses.     Heart sounds: Normal heart sounds.  Pulmonary:     Effort: Pulmonary effort is normal.     Breath sounds: Normal breath sounds.  Abdominal:     General: Bowel sounds are normal.     Palpations: Abdomen is soft.  Musculoskeletal:        General: Normal range of motion.     Cervical back: Normal range of motion.  Lymphadenopathy:     Cervical: No cervical adenopathy.  Skin:    General: Skin is warm.  Neurological:     General: No focal deficit present.     Mental Status: She is alert and oriented to person, place, and time. Mental status is at baseline.  Psychiatric:        Mood and Affect: Mood normal.        Behavior: Behavior normal.        Thought Content: Thought content normal.        Judgment: Judgment normal.     Advanced Directives: Provided booklet  EKG:  unchanged from previous tracings   Assessment:   This is a routine wellness examination for this patient .   Vision/Hearing screen Vision Screening   Right eye Left eye Both eyes  Without correction 20/30 20/30 20/25   With correction     Hearing Screening - Comments:: Passed whisper test   Goals   None     Depression Screen    04/26/2023    8:16 AM  04/26/2023    8:02 AM 01/22/2023    3:10 PM 11/16/2022   10:41 AM  PHQ 2/9 Scores  PHQ - 2 Score 2 0 0   PHQ- 9 Score 2  1      Information is confidential and restricted. Go to  Review Flowsheets to unlock data.     Fall Risk    04/26/2023    8:16 AM  Fall Risk   Falls in the past year? 1  Number falls in past yr: 0  Injury with Fall? 0  Risk for fall due to : No Fall Risks  Follow up Falls evaluation completed;Education provided    Cognitive Function: Normal   Patient Care Team: Dana Allan, MD as PCP - General (Family Medicine) Antonieta Iba, MD as PCP - Cardiology (Cardiology)     Plan:    I have personally reviewed and noted the following in the patient's chart:   Medical and social history Use of alcohol, tobacco or illicit drugs  Current medications and supplements Functional ability and status Nutritional status Physical activity Advanced directives List of other physicians Hospitalizations, surgeries, and ER visits in previous 12 months Vitals Screenings to include cognitive, depression, and falls Referrals and appointments  In addition, I have reviewed and discussed with patient certain preventive protocols, quality metrics, and best practice recommendations. A written personalized care plan for preventive services as well as general preventive health recommendations were provided to patient.     Dana Allan, MD 05/03/2023

## 2023-04-26 NOTE — Patient Instructions (Addendum)
It was a pleasure meeting you today. Thank you for allowing me to take part in your health care.  Our goals for today as we discussed include:  Removal of diagnosis of Barrett's esophagus from chart as requested.  Refill sent for requested medication  Pneumonia 20 vaccine given today. No further vaccines required for pneumonia   Address: [address], [place], [place] [address] Open  Closes 12?PM Phone: [phone]  This is a list of the screening recommended for you and due dates:  Health Maintenance  Topic Date Due  . Cologuard (Stool DNA test)  Never done  . Pneumonia Vaccine (2 of 2 - PPSV23 or PCV20) 05/31/2022  . DEXA scan (bone density measurement)  Never done  . COVID-19 Vaccine (6 - 2023-24 season) 05/02/2023  . Medicare Annual Wellness Visit  04/25/2024  . Mammogram  05/31/2024  . DTaP/Tdap/Td vaccine (3 - Td or Tdap) 03/08/2030  . Flu Shot  Completed  . Hepatitis C Screening  Completed  . HIV Screening  Completed  . Zoster (Shingles) Vaccine  Completed  . HPV Vaccine  Aged Out  . Colon Cancer Screening  Discontinued    If you have any questions or concerns, please do not hesitate to call the office at [phone].  I look forward to our next visit and until then take care and stay safe.  Regards,   Dana Allan, MD   [facility]  Bone Density Test A bone density test uses a type of X-ray to measure the amount of calcium and other minerals in a person's bones. It can measure bone density in the hip and the spine. The test is similar to having a regular X-ray. This test may also be called: Bone densitometry. Bone mineral density test. Dual-energy X-ray absorptiometry (DEXA). You may have this test to: Diagnose a condition that causes weak or thin bones (osteoporosis). Screen you for osteoporosis. Predict your risk for a broken bone (fracture). Determine how well your osteoporosis treatment is working. Tell a health care  provider about: Any allergies you have. All medicines you are taking, including vitamins, herbs, eye drops, creams, and over-the-counter medicines. Any problems you or family members have had with anesthetic medicines. Any blood disorders you have. Any surgeries you have had. Any medical conditions you have. Whether you are pregnant or may be pregnant. Any medical tests you have had within the past 14 days that used contrast material. What are the risks? Generally, this is a safe test. However, it does expose you to a small amount of radiation, which can slightly increase your cancer risk. What happens before the test? Do not take any calcium supplements within the 24 hours before your test. You will need to remove all metal jewelry, eyeglasses, removable dental appliances, and any other metal objects on your body. What happens during the test?  You will lie down on an exam table. There will be an X-ray generator below you and an imaging device above you. Other devices, such as boxes or braces, may be used to position your body properly for the scan. The machine will slowly scan your body. You will need to keep very still while the machine does the scan. The images will show up on a screen in the room. Images will be examined by a specialist after your test is finished. The procedure may vary among health care providers and hospitals. What can I expect after the test? It is up to you to get  the results of your test. Ask your health care provider, or the department that is doing the test, when your results will be ready. Summary A bone density test is an imaging test that uses a type of X-ray to measure the amount of calcium and other minerals in your bones. The test may be used to diagnose or screen you for a condition that causes weak or thin bones (osteoporosis), predict your risk for a broken bone (fracture), or determine how well your osteoporosis treatment is working. Do not take any  calcium supplements within 24 hours before your test. Ask your health care provider, or the department that is doing the test, when your results will be ready. This information is not intended to replace advice given to you by your health care provider. Make sure you discuss any questions you have with your health care provider. Document Revised: 02/08/2021 Document Reviewed: 11/12/2019 Elsevier Patient Education  2024 Else[employer] Inc.  Fat and Cholesterol Restricted Eating Plan Eating a diet that limits fat and cholesterol may help lower your risk for heart disease and other conditions. Your body needs fat and cholesterol for basic functions, but eating too much of these things can be harmful to your health. Your health care provider may order lab tests to check your blood fat (lipid) and cholesterol levels. This helps your health care provider understand your risk for certain conditions and whether you need to make diet changes. Work with your health care provider or dietitian to make an eating plan that is right for you. Your plan includes: Limit your fat intake to ______% or less of your total calories a day. This is ______g of fat per day. Limit your saturated fat intake to ______% or less of your total calories a day. This is ______g of saturated fat per day. Limit the amount of cholesterol in your diet to less than _________mg a day. Eat ___________ g of fiber a day. What are tips for following this plan? General guidelines If you are overweight, work with your health care provider to lose weight safely. Losing just 5-10% of your body weight can improve your overall health and help prevent diseases such as diabetes and heart disease. Avoid: Foods with added sugar. Fried foods. Foods that contain partially hydrogenated oils, including stick margarine, some tub margarines, cookies, crackers, and other baked goods. If you drink alcohol: Limit how much you have to: 0-1 drink a day for women  who are not pregnant. 0-2 drinks a day for men. Know how much alcohol is in a drink. In the [place] one drink equals one 12 oz bottle of beer (355 mL), one 5 oz glass of wine (148 mL), or one 1 oz glass of hard liquor (44 mL). Reading food labels Check food labels for: Trans fats or partially hydrogenated oils. Avoid foods that contain these. High amounts of saturated fat. Choose foods that are low in saturated fat (less than 2 g). The amount of cholesterol in each serving. The amount of fiber in each serving. Choose foods with healthy fats, such as: Monounsaturated and polyunsaturated fats. These include olive and canola oil, flaxseeds, walnuts, almonds, and seeds. Omega-3 fats. These are found in foods such as salmon, mackerel, sardines, tuna, flaxseed oil, and ground flaxseeds. Choose grain products that have whole grains. Look for the word "whole" as the first word in the ingredient list. Cooking Cook foods using methods other than frying. Baking, boiling, grilling, and broiling are some healthy options. Eat more home-cooked food  and less restaurant, buffet, and fast food. Avoid cooking using saturated fats. Animal sources of saturated fats include meats, butter, and cream. Plant sources of saturated fats include palm oil, palm kernel oil, and coconut oil. Meal planning  At meals, imagine dividing your plate into fourths: Fill one-half of your plate with vegetables, green salads, and fruit. Fill one-fourth of your plate with whole grains. Fill one-fourth of your plate with lean protein foods. Eat fish that is high in omega-3 fats at least two times a week. Eat more foods that contain fiber, such as whole grains, beans, apples, pears, berries, broccoli, carrots, peas, and barley. These foods help promote healthy cholesterol levels in the blood. What foods should I eat? Fruits All fresh, canned (in natural juice), or frozen fruits. Vegetables Fresh or frozen vegetables (raw, steamed,  roasted, or grilled). Green salads. Grains Whole grains, such as whole wheat or whole grain breads, crackers, cereals, and pasta. Unsweetened oatmeal, bulgur, barley, quinoa, or brown rice. Corn or whole wheat flour tortillas. Meats and other proteins Ground beef (85% or leaner), grass-fed beef, or beef trimmed of fat. Skinless chicken or [place]. Ground chicken or [place]. Pork trimmed of fat. All fish and seafood. Egg whites. Dried beans, peas, or lentils. Unsalted nuts or seeds. Unsalted canned beans. Natural nut butters without added sugar and oil. Dairy Low-fat or nonfat dairy products, such as skim or 1% milk, 2% or reduced-fat cheeses, low-fat and fat-free ricotta or cottage cheese, or plain low-fat and nonfat yogurt. Fats and oils Tub margarine without trans fats. Light or reduced-fat mayonnaise and salad dressings. Avocado. Olive, canola, sesame, or safflower oils. The items listed above may not be a complete list of foods and beverages you can eat. Contact a dietitian for more information. What foods should I avoid? Fruits Canned fruit in heavy syrup. Fruit in cream or butter sauce. Fried fruit. Vegetables Vegetables cooked in cheese, cream, or butter sauce. Fried vegetables. Grains White bread. White pasta. White rice. Cornbread. Bagels, pastries, and croissants. Crackers and snack foods that contain trans fat and hydrogenated oils. Meats and other proteins Fatty cuts of meat. Ribs, chicken wings, bacon, sausage, bologna, salami, chitterlings, fatback, hot dogs, bratwurst, and packaged lunch meats. Liver and organ meats. Whole eggs and egg yolks. Chicken and [place] with skin. Fried meat. Dairy Whole or 2% milk, cream, half-and-half, and cream cheese. Whole milk cheeses. Whole-fat or sweetened yogurt. Full-fat cheeses. Nondairy creamers and whipped toppings. Processed cheese, cheese spreads, and cheese curds. Fats and oils Butter, stick margarine, lard, shortening, ghee, or bacon fat.  Coconut, palm kernel, and palm oils. Beverages Alcohol. Sugar-sweetened drinks such as sodas, lemonade, and fruit drinks. Sweets and desserts Corn syrup, sugars, honey, and molasses. Candy. Jam and jelly. Syrup. Sweetened cereals. Cookies, pies, cakes, donuts, muffins, and ice cream. The items listed above may not be a complete list of foods and beverages you should avoid. Contact a dietitian for more information. Summary Your body needs fat and cholesterol for basic functions. However, eating too much of these things can be harmful to your health. Work with your health care provider and dietitian to follow a diet that limits fat and cholesterol. Doing this may help lower your risk for heart disease and other conditions. Choose healthy fats, such as monounsaturated and polyunsaturated fats, and foods high in omega-3 fatty acids. Eat fiber-rich foods, such as whole grains, beans, peas, fruits, and vegetables. Limit or avoid alcohol, fried foods, and foods high in saturated fats, partially hydrogenated oils,  and sugar. This information is not intended to replace advice given to you by your health care provider. Make sure you discuss any questions you have with your health care provider. Document Revised: 10/07/2020 Document Reviewed: 10/07/2020 Elsevier Patient Education  2024 [employer].  Fall Prevention in the Home, Adult Falls can cause injuries and can happen to people of all ages. There are many things you can do to make your home safer and to help prevent falls. What actions can I take to prevent falls? General information Use good lighting in all rooms. Make sure to: Replace any light bulbs that burn out. Turn on the lights in dark areas and use night-lights. Keep items that you use often in easy-to-reach places. Lower the shelves around your home if needed. Move furniture so that there are clear paths around it. Do not use throw rugs or other things on the floor that can make you  trip. If any of your floors are uneven, fix them. Add color or contrast paint or tape to clearly mark and help you see: Grab bars or handrails. First and last steps of staircases. Where the edge of each step is. If you use a ladder or stepladder: Make sure that it is fully opened. Do not climb a closed ladder. Make sure the sides of the ladder are locked in place. Have someone hold the ladder while you use it. Know where your pets are as you move through your home. What can I do in the bathroom?     Keep the floor dry. Clean up any water on the floor right away. Remove soap buildup in the bathtub or shower. Buildup makes bathtubs and showers slippery. Use non-skid mats or decals on the floor of the bathtub or shower. Attach bath mats securely with double-sided, non-slip rug tape. If you need to sit down in the shower, use a non-slip stool. Install grab bars by the toilet and in the bathtub and shower. Do not use towel bars as grab bars. What can I do in the bedroom? Make sure that you have a light by your bed that is easy to reach. Do not use any sheets or blankets on your bed that hang to the floor. Have a firm chair or bench with side arms that you can use for support when you get dressed. What can I do in the kitchen? Clean up any spills right away. If you need to reach something above you, use a step stool with a grab bar. Keep electrical cords out of the way. Do not use floor polish or wax that makes floors slippery. What can I do with my stairs? Do not leave anything on the stairs. Make sure that you have a light switch at the top and the bottom of the stairs. Make sure that there are handrails on both sides of the stairs. Fix handrails that are broken or loose. Install non-slip stair treads on all your stairs if they do not have carpet. Avoid having throw rugs at the top or bottom of the stairs. Choose a carpet that does not hide the edge of the steps on the stairs. Make  sure that the carpet is firmly attached to the stairs. Fix carpet that is loose or worn. What can I do on the outside of my home? Use bright outdoor lighting. Fix the edges of walkways and driveways and fix any cracks. Clear paths of anything that can make you trip, such as tools or rocks. Add color or contrast  paint or tape to clearly mark and help you see anything that might make you trip as you walk through a door, such as a raised step or threshold. Trim any bushes or trees on paths to your home. Check to see if handrails are loose or broken and that both sides of all steps have handrails. Install guardrails along the edges of any raised decks and porches. Have leaves, snow, or ice cleared regularly. Use sand, salt, or ice melter on paths if you live where there is ice and snow during the winter. Clean up any spills in your garage right away. This includes grease or oil spills. What other actions can I take? Review your medicines with your doctor. Some medicines can cause dizziness or changes in blood pressure, which increase your risk of falling. Wear shoes that: Have a low heel. Do not wear high heels. Have rubber bottoms and are closed at the toe. Feel good on your feet and fit well. Use tools that help you move around if needed. These include: Canes. Walkers. Scooters. Crutches. Ask your doctor what else you can do to help prevent falls. This may include seeing a physical therapist to learn to do exercises to move better and get stronger. Where to find more information Centers for Disease Control and Prevention, [employer]: TonerPromos.no [employer] on Aging: BaseRingTones.pl National Institute on Aging: BaseRingTones.pl Contact a doctor if: You are afraid of falling at home. You feel weak, drowsy, or dizzy at home. You fall at home. Get help right away if you: Lose consciousness or have trouble moving after a fall. Have a fall that causes a head injury. These symptoms may be an emergency.  Get help right away. Call 911. Do not wait to see if the symptoms will go away. Do not drive yourself to the hospital. This information is not intended to replace advice given to you by your health care provider. Make sure you discuss any questions you have with your health care provider. Document Revised: 01/29/2022 Document Reviewed: 01/29/2022 Elsevier Patient Education  2024 Elsevier Inc.  Health Maintenance, Female Adopting a healthy lifestyle and getting preventive care are important in promoting health and wellness. Ask your health care provider about: The right schedule for you to have regular tests and exams. Things you can do on your own to prevent diseases and keep yourself healthy. What should I know about diet, weight, and exercise? Eat a healthy diet  Eat a diet that includes plenty of vegetables, fruits, low-fat dairy products, and lean protein. Do not eat a lot of foods that are high in solid fats, added sugars, or sodium. Maintain a healthy weight Body mass index (BMI) is used to identify weight problems. It estimates body fat based on height and weight. Your health care provider can help determine your BMI and help you achieve or maintain a healthy weight. Get regular exercise Get regular exercise. This is one of the most important things you can do for your health. Most adults should: Exercise for at least 150 minutes each week. The exercise should increase your heart rate and make you sweat (moderate-intensity exercise). Do strengthening exercises at least twice a week. This is in addition to the moderate-intensity exercise. Spend less time sitting. Even light physical activity can be beneficial. Watch cholesterol and blood lipids Have your blood tested for lipids and cholesterol at 66 years of age, then have this test every 5 years. Have your cholesterol levels checked more often if: Your lipid or cholesterol levels  are high. You are older than 66 years of age. You  are at high risk for heart disease. What should I know about cancer screening? Depending on your health history and family history, you may need to have cancer screening at various ages. This may include screening for: Breast cancer. Cervical cancer. Colorectal cancer. Skin cancer. Lung cancer. What should I know about heart disease, diabetes, and high blood pressure? Blood pressure and heart disease High blood pressure causes heart disease and increases the risk of stroke. This is more likely to develop in people who have high blood pressure readings or are overweight. Have your blood pressure checked: Every 3-5 years if you are 36-3 years of age. Every year if you are 51 years old or older. Diabetes Have regular diabetes screenings. This checks your fasting blood sugar level. Have the screening done: Once every three years after age 82 if you are at a normal weight and have a low risk for diabetes. More often and at a younger age if you are overweight or have a high risk for diabetes. What should I know about preventing infection? Hepatitis B If you have a higher risk for hepatitis B, you should be screened for this virus. Talk with your health care provider to find out if you are at risk for hepatitis B infection. Hepatitis C Testing is recommended for: Everyone born from [birth year] through 1964. Anyone with known risk factors for hepatitis C. Sexually transmitted infections (STIs) Get screened for STIs, including gonorrhea and chlamydia, if: You are sexually active and are younger than 66 years of age. You are older than 66 years of age and your health care provider tells you that you are at risk for this type of infection. Your sexual activity has changed since you were last screened, and you are at increased risk for chlamydia or gonorrhea. Ask your health care provider if you are at risk. Ask your health care provider about whether you are at high risk for HIV. Your health care  provider may recommend a prescription medicine to help prevent HIV infection. If you choose to take medicine to prevent HIV, you should first get tested for HIV. You should then be tested every 3 months for as long as you are taking the medicine. Pregnancy If you are about to stop having your period (premenopausal) and you may become pregnant, seek counseling before you get pregnant. Take 400 to 800 micrograms (mcg) of folic acid every day if you become pregnant. Ask for birth control (contraception) if you want to prevent pregnancy. Osteoporosis and menopause Osteoporosis is a disease in which the bones lose minerals and strength with aging. This can result in bone fractures. If you are 33 years old or older, or if you are at risk for osteoporosis and fractures, ask your health care provider if you should: Be screened for bone loss. Take a calcium or vitamin D supplement to lower your risk of fractures. Be given hormone replacement therapy (HRT) to treat symptoms of menopause. Follow these instructions at home: Alcohol use Do not drink alcohol if: Your health care provider tells you not to drink. You are pregnant, may be pregnant, or are planning to become pregnant. If you drink alcohol: Limit how much you have to: 0-1 drink a day. Know how much alcohol is in your drink. In the [place] one drink equals one 12 oz bottle of beer (355 mL), one 5 oz glass of wine (148 mL), or one 1 oz glass of hard  liquor (44 mL). Lifestyle Do not use any products that contain nicotine or tobacco. These products include cigarettes, chewing tobacco, and vaping devices, such as e-cigarettes. If you need help quitting, ask your health care provider. Do not use street drugs. Do not share needles. Ask your health care provider for help if you need support or information about quitting drugs. General instructions Schedule regular health, dental, and eye exams. Stay current with your vaccines. Tell your health care  provider if: You often feel depressed. You have ever been abused or do not feel safe at home. Summary Adopting a healthy lifestyle and getting preventive care are important in promoting health and wellness. Follow your health care provider's instructions about healthy diet, exercising, and getting tested or screened for diseases. Follow your health care provider's instructions on monitoring your cholesterol and blood pressure. This information is not intended to replace advice given to you by your health care provider. Make sure you discuss any questions you have with your health care provider. Document Revised: 10/17/2020 Document Reviewed: 10/17/2020 Elsevier Patient Education  2024 [employer].

## 2023-05-01 DIAGNOSIS — Z1211 Encounter for screening for malignant neoplasm of colon: Secondary | ICD-10-CM | POA: Diagnosis not present

## 2023-05-03 ENCOUNTER — Encounter: Payer: Self-pay | Admitting: Family Medicine

## 2023-05-03 DIAGNOSIS — Z0000 Encounter for general adult medical examination without abnormal findings: Secondary | ICD-10-CM

## 2023-05-03 DIAGNOSIS — Z23 Encounter for immunization: Secondary | ICD-10-CM | POA: Insufficient documentation

## 2023-05-03 DIAGNOSIS — Z131 Encounter for screening for diabetes mellitus: Secondary | ICD-10-CM | POA: Insufficient documentation

## 2023-05-03 HISTORY — DX: Encounter for general adult medical examination without abnormal findings: Z00.00

## 2023-05-06 NOTE — Progress Notes (Unsigned)
Cardiology Office Note  Date:  05/07/2023   ID:  Maureen Pena, DOB [birth year], MRN [id number]  PCP:  Dana Allan, MD   Chief Complaint  Patient presents with   Follow-up    12 month f/u no complaints today. Meds reviewed verbally with pt.    HPI:  67 year old woman with history of  smoking, quit 2/24 hyperlipidemia,  strong family history of coronary disease  Late presenting STEMI, initial event August 16, 2019 Severe single-vessel disease proximal to mid left circumflex after OM1 takeoff Collaterals noted medical management was recommended Who presents for follow up of her CAD  Last office visit with myself November 2023  Has Two dogs of her own Walks dogs daily, 1 mile Denies chest pain concerning for angina  More hip discomfort jan 2024, wonders if it is from Lipitor going from 10-20  Previously smoking <1/2 ppd,  5 to 8 a day On today's visit reports that she quit smoking early 2024, short relapse August 2024 . Closely monitoring her diet, eats high-protein, veggies Actively working in her yard, " to raise her HDL"  Labs reviewed  On  lipitor 20 mg daily/zetia 10 mg daily Total chol 142 LDL 64  EKG personally reviewed by myself on todays visit EKG Interpretation Date/Time:  Tuesday May 07 2023 08:45:22 EST Ventricular Rate:  59 PR Interval:  178 QRS Duration:  80 QT Interval:  422 QTC Calculation: 417 R Axis:   66  Text Interpretation: Sinus bradycardia Nonspecific T wave abnormality When compared with ECG of 21-Aug-2019 10:30, No significant change was found Confirmed by Julien Nordmann [id number]) on 05/07/2023 8:50:44 AM   Other past medical history reviewed previously tried Chantix Had not tried other modalities  chest pain August 16, 2019 developed 15 minutes of chest pain radiating through to her back between her scapulas.   did not seek out assistance, Seen by primary care March 10, lab work performed showing elevated D-dimer, elevated  troponin  presented to the emergency room, initial EKG concerning for late presenting inferior wall STEMI.     PMH:   has a past medical history of Anxiety, Depression, Diverticulitis, History of chicken pox, Thyroid disease, and UTI (urinary tract infection).  PSH:    Past Surgical History:  Procedure Laterality Date   ESOPHAGOGASTRODUODENOSCOPY (EGD) WITH PROPOFOL N/A 05/02/2018   Procedure: ESOPHAGOGASTRODUODENOSCOPY (EGD) WITH PROPOFOL;  Surgeon: Midge Minium, MD;  Location: [facility];  Service: Endoscopy;  Laterality: N/A;   LEFT HEART CATH AND CORONARY ANGIOGRAPHY N/A 08/20/2019   Procedure: LEFT HEART CATH AND CORONARY ANGIOGRAPHY;  Surgeon: Antonieta Iba, MD;  Location: [facility] INVASIVE CV LAB;  Service: Cardiovascular;  Laterality: N/A;   TUBAL LIGATION      Current Outpatient Medications  Medication Sig Dispense Refill   aspirin 81 MG EC tablet Take 1 tablet (81 mg total) by mouth daily. 90 tablet 3   atorvastatin (LIPITOR) 20 MG tablet TAKE ONE TABLET BY MOUTH EVERY EVENING AT 6:00PM. PLEASE CALL [phone] TO SCHEDULE YEARLY VISIT TO RECEIVE FURTHER REFILLS. THANK YOU. 90 tablet 0   Cholecalciferol (VITAMIN D-3) 5000 units TABS Take 4,000 Units by mouth.     escitalopram (LEXAPRO) 5 MG tablet Take 1 tablet (5 mg total) by mouth daily. 90 tablet 3   ezetimibe (ZETIA) 10 MG tablet Take 1 tablet (10 mg total) by mouth daily. PLEASE CALL OFFICE TO SCHEDULE APPOINTMENT PRIOR TO NEXT REFILL 90 tablet 0   levothyroxine (SYNTHROID) 100 MCG tablet Take 1 30 minutes  before meal in the am qd 90 tablet 3   nitroGLYCERIN (NITROSTAT) 0.4 MG SL tablet Place 1 tablet (0.4 mg total) under the tongue every 5 (five) minutes x 3 doses as needed for chest pain. 30 tablet 2   Varenicline Tartrate (CHANTIX PO) Take by mouth daily at 8 pm.     No current facility-administered medications for this visit.    Allergies:   Patient has no known allergies.   Social History:  The patient   reports that she quit smoking about 9 months ago. Her smoking use included cigarettes. She has never used smokeless tobacco. She reports that she does not currently use alcohol after a past usage of about 1.0 standard drink of alcohol per week. She reports current drug use. Drug: Marijuana.   Family History:   family history includes Anxiety disorder in her mother; CAD in her father; COPD in her brother and father; Depression in her brother and mother; Early death in her father; Hearing loss in her father; Heart disease in her father; Hypertension in her brother; Kidney disease in her brother; Paranoid behavior in her mother; Pulmonary fibrosis in her brother; Schizophrenia in her mother; Stroke in her mother.    Review of Systems: Review of Systems  Constitutional: Negative.   HENT: Negative.    Respiratory: Negative.    Cardiovascular: Negative.   Gastrointestinal: Negative.   Musculoskeletal: Negative.        Foot pain  Neurological: Negative.   Psychiatric/Behavioral: Negative.    All other systems reviewed and are negative.   PHYSICAL EXAM: VS:  BP 110/74 (BP Location: Left Arm, Patient Position: Sitting, Cuff Size: Normal)   Pulse (!) 59   Ht 5' 6.5" (1.689 m)   Wt 158 lb 8 oz (71.9 kg)   SpO2 98%   BMI 25.20 kg/m  , BMI Body mass index is 25.2 kg/m. Constitutional:  oriented to person, place, and time. No distress.  HENT:  Head: Grossly normal Eyes:  no discharge. No scleral icterus.  Neck: No JVD, no carotid bruits  Cardiovascular: Regular rate and rhythm, no murmurs appreciated Pulmonary/Chest: Clear to auscultation bilaterally, no wheezes or rails Abdominal: Soft.  no distension.  no tenderness.  Musculoskeletal: Normal range of motion Neurological:  normal muscle tone. Coordination normal. No atrophy Skin: Skin warm and dry Psychiatric: normal affect, pleasant  Recent Labs: 01/17/2023: ALT 36; BUN 17; Creatinine, Ser 0.96; Potassium 4.9; Sodium 142; TSH 1.00     Lipid Panel Lab Results  Component Value Date   CHOL 142 01/17/2023   HDL 60.30 01/17/2023   LDLCALC 64 01/17/2023   TRIG 87.0 01/17/2023      Wt Readings from Last 3 Encounters:  05/07/23 158 lb 8 oz (71.9 kg)  04/26/23 159 lb (72.1 kg)  01/22/23 159 lb 8 oz (72.3 kg)     ASSESSMENT AND PLAN:  Problem List Items Addressed This Visit       Cardiology Problems   Hyperlipidemia   Relevant Orders   EKG 12-Lead (Completed)   Coronary artery disease involving native coronary artery of native heart without angina pectoris - Primary     Other   Gastroesophageal reflux disease without esophagitis   Relevant Orders   EKG 12-Lead (Completed)   Other Visit Diagnoses     Hyperlipidemia LDL goal <70       Relevant Orders   EKG 12-Lead (Completed)   Nonrheumatic mitral valve regurgitation       Relevant Orders   EKG  12-Lead (Completed)   Smoker       Relevant Orders   EKG 12-Lead (Completed)      Coronary disease with stable angina Currently with no symptoms of angina. No further workup at this time. Continue current medication regimen.  Smoker Previously smoking 1/2 pack/day, reports that she quit February 2024 Short relapse August 2024  Hyperlipidemia Long discussion concerning number goal Continue  Lipitor  20 mg daily with Zetia  Goal LDL less than 55 She is requesting repeat lipid panel today as she has been working hard on her diet and exercise  Hypothyroid followed by PMD   Signed, Dossie Arbour, M.D., Ph.D. [facility] [place], [place] [phone]

## 2023-05-07 ENCOUNTER — Ambulatory Visit: Payer: Medicare Other | Attending: Cardiovascular Disease | Admitting: Cardiovascular Disease

## 2023-05-07 ENCOUNTER — Encounter: Payer: Self-pay | Admitting: Cardiovascular Disease

## 2023-05-07 VITALS — BP 110/74 | HR 59 | Ht 66.5 in | Wt 158.5 lb

## 2023-05-07 DIAGNOSIS — I2510 Atherosclerotic heart disease of native coronary artery without angina pectoris: Secondary | ICD-10-CM | POA: Diagnosis not present

## 2023-05-07 DIAGNOSIS — I25118 Atherosclerotic heart disease of native coronary artery with other forms of angina pectoris: Secondary | ICD-10-CM | POA: Diagnosis not present

## 2023-05-07 DIAGNOSIS — E785 Hyperlipidemia, unspecified: Secondary | ICD-10-CM | POA: Diagnosis not present

## 2023-05-07 DIAGNOSIS — F17200 Nicotine dependence, unspecified, uncomplicated: Secondary | ICD-10-CM

## 2023-05-07 DIAGNOSIS — K219 Gastro-esophageal reflux disease without esophagitis: Secondary | ICD-10-CM

## 2023-05-07 DIAGNOSIS — I340 Nonrheumatic mitral (valve) insufficiency: Secondary | ICD-10-CM | POA: Diagnosis not present

## 2023-05-07 MED ORDER — EZETIMIBE 10 MG PO TABS: 10.0000 mg | ORAL_TABLET | Freq: Every day | ORAL | 3 refills | Status: DC

## 2023-05-07 MED ORDER — ATORVASTATIN CALCIUM 20 MG PO TABS: ORAL_TABLET | ORAL | 3 refills | Status: DC

## 2023-05-07 NOTE — Patient Instructions (Addendum)
Medication Instructions:  No changes  If you need a refill on your cardiac medications before your next appointment, please call your pharmacy.   Lab work: Lipid panel today  Testing/Procedures: No new testing needed  Follow-Up: At [facility], you and your health needs are our priority.  As part of our continuing mission to provide you with exceptional heart care, we have created designated Provider Care Teams.  These Care Teams include your primary Cardiologist (physician) and Advanced Practice Providers (APPs -  Physician Assistants and Nurse Practitioners) who all work together to provide you with the care you need, when you need it.  You will need a follow up appointment in 12 months  Providers on your designated Care Team:   Nicolasa Ducking, NP Eula Listen, PA-C Cadence Fransico Michael, [place]  COVID-19 Vaccine Information can be found at: [email or web address] For questions related to vaccine distribution or appointments, please email [email or web address] or call 5045917876.

## 2023-05-08 ENCOUNTER — Encounter: Payer: Self-pay | Admitting: Emergency Medicine

## 2023-05-08 LAB — LIPID PANEL
Chol/HDL Ratio: 2.5 {ratio} (ref 0.0–4.4)
Cholesterol, Total: 159 mg/dL (ref 100–199)
HDL: 63 mg/dL (ref >39)
LDL Chol Calc (NIH): 83 mg/dL (ref 0–99)
Triglycerides: 67 mg/dL (ref 0–149)
VLDL Cholesterol Cal: 13 mg/dL (ref 5–40)

## 2023-05-08 LAB — COLOGUARD: COLOGUARD: NEGATIVE

## 2023-05-10 ENCOUNTER — Encounter: Payer: Self-pay | Admitting: Family Medicine

## 2023-05-13 ENCOUNTER — Telehealth: Payer: Self-pay | Admitting: Cardiovascular Disease

## 2023-05-13 NOTE — Telephone Encounter (Signed)
Patient was returning call. Please advise ?

## 2023-05-13 NOTE — Telephone Encounter (Signed)
Called patient and left voicemail to call back.

## 2023-05-14 NOTE — Telephone Encounter (Signed)
See result note.  

## 2023-05-15 ENCOUNTER — Ambulatory Visit: Payer: Medicare Other | Admitting: Psychiatry

## 2023-05-15 ENCOUNTER — Encounter: Payer: Self-pay | Admitting: Psychiatry

## 2023-05-15 VITALS — BP 129/83 | HR 67 | Temp 97.6°F | Ht 66.5 in | Wt 162.0 lb

## 2023-05-15 DIAGNOSIS — F17200 Nicotine dependence, unspecified, uncomplicated: Secondary | ICD-10-CM

## 2023-05-15 DIAGNOSIS — F3342 Major depressive disorder, recurrent, in full remission: Secondary | ICD-10-CM | POA: Diagnosis not present

## 2023-05-15 DIAGNOSIS — Z79899 Other long term (current) drug therapy: Secondary | ICD-10-CM

## 2023-05-15 MED ORDER — ESCITALOPRAM OXALATE 5 MG PO TABS: 5.0000 mg | ORAL_TABLET | Freq: Every day | ORAL | 3 refills | Status: DC

## 2023-05-15 NOTE — Patient Instructions (Signed)
Wendall Mola MD [address], [place], [place] [address]  [phone] [email or web address]

## 2023-05-15 NOTE — Progress Notes (Unsigned)
BH MD OP Progress Note  05/15/2023 12:49 PM Maureen Pena  MRN:  [id number]  Chief Complaint:  Chief Complaint  Patient presents with   Follow-up   Depression   Medication Refill   HPI: Maureen Pena is a 67 year old Caucasian female, divorced, retired, lives in [place], has a history of MDD, tobacco use disorder, hypothyroidism was evaluated in office today.  The patient presents with concerns about a recent increase in her LDL cholesterol despite adherence to a high fiber, low fat diet and regular physical activity. She reports frustration with the recent lipid panel results, as she has been actively working to improve her HDL levels through physical activity, only to be informed that the focus is now on her LDL levels. The patient is currently on a statin and Zetia for cholesterol management.  The patient also has a history of hypothyroidism and is on Synthroid. She expresses concern about the potential relationship between her hypothyroidism and high cholesterol, wondering if her thyroid medication dosage might be too low. Her TSH and T3 were reported to be at the low end of the normal range in August.The patient is currently considering a consultation with an endocrinologist to further explore the potential relationship between her thyroid function and cholesterol levels.  The patient has successfully quit smoking with the aid of Chantix, which she has since discontinued.   The patient is physically active, engaging in yard work for several hours daily and walking her dogs for a mile each morning. She expresses concern about maintaining this level of activity with the onset of colder weather.  Patient currently compliant on the Lexapro and reports mood symptoms as stable.  Denies any suicidality, homicidality or perceptual disturbances.  Patient appeared to be alert, oriented to person place time situation.  Denies any other concerns today.   Visit Diagnosis:     ICD-10-CM   1. MDD (major depressive disorder), recurrent, in full remission (HCC)  F33.42 escitalopram (LEXAPRO) 5 MG tablet    Platelet count    2. Tobacco use disorder  F17.200    In remission    3. High risk medication use  Z79.899 Platelet count      Past Psychiatric History: I have reviewed past psychiatric history from progress note on 02/06/2018.  Past trials of Lexapro, hydroxyzine.  Past Medical History:  Past Medical History:  Diagnosis Date   Anxiety    Depression    Diverticulitis    History of chicken pox    Thyroid disease    hypothyroidism   UTI (urinary tract infection)     Past Surgical History:  Procedure Laterality Date   ESOPHAGOGASTRODUODENOSCOPY (EGD) WITH PROPOFOL N/A 05/02/2018   Procedure: ESOPHAGOGASTRODUODENOSCOPY (EGD) WITH PROPOFOL;  Surgeon: Midge Minium, MD;  Location: [facility];  Service: Endoscopy;  Laterality: N/A;   LEFT HEART CATH AND CORONARY ANGIOGRAPHY N/A 08/20/2019   Procedure: LEFT HEART CATH AND CORONARY ANGIOGRAPHY;  Surgeon: Antonieta Iba, MD;  Location: [facility] INVASIVE CV LAB;  Service: Cardiovascular;  Laterality: N/A;   TUBAL LIGATION      Family Psychiatric History: I have reviewed family psychiatric history from progress note on 02/06/2018.  Family History:  Family History  Problem Relation Age of Onset   Depression Mother    Anxiety disorder Mother    Schizophrenia Mother    Paranoid behavior Mother    Stroke Mother    COPD Father    Early death Father    Hearing loss Father    Heart disease  Father    CAD Father        s/p cabg x 3    Depression Brother    COPD Brother    Hypertension Brother    Kidney disease Brother    Pulmonary fibrosis Brother        died 53 Jul 23, 2020   Breast cancer Neg Hx     Social History: I have reviewed social history from progress note on 02/06/2018. Social History   Socioeconomic History   Marital status: Divorced    Spouse name: Not on file   Number of children: 2    Years of education: Not on file   Highest education level: Professional school degree (e.g., MD, DDS, DVM, JD)  Occupational History    Comment: part time  Tobacco Use   Smoking status: Former    Current packs/day: 0.00    Types: Cigarettes    Quit date: 07/14/2022    Years since quitting: 0.8   Smokeless tobacco: Never  Vaping Use   Vaping status: Never Used  Substance and Sexual Activity   Alcohol use: Not Currently    Alcohol/week: 1.0 standard drink of alcohol    Types: 1 Glasses of wine per week   Drug use: Yes    Types: Marijuana   Sexual activity: Not Currently  Other Topics Concern   Not on file  Social History Narrative   Kids    No guns    Wears seat belt    Safe in relationship    Smoker    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    Divorced    Social Determinants of Health   Financial Resource Strain: Low Risk  (04/26/2023)   Overall Financial Resource Strain (CARDIA)    Difficulty of Paying Living Expenses: Not hard at all  Food Insecurity: No Food Insecurity (04/26/2023)   Hunger Vital Sign    Worried About Running Out of Food in the Last Year: Never true    Ran Out of Food in the Last Year: Never true  Transportation Needs: No Transportation Needs (04/26/2023)   PRAPARE - Administrator, Civil Service (Medical): No    Lack of Transportation (Non-Medical): No  Physical Activity: Sufficiently Active (04/26/2023)   Exercise Vital Sign    Days of Exercise per Week: 7 days    Minutes of Exercise per Session: 120 min  Stress: No Stress Concern Present (04/26/2023)   [employer] of Occupational Health - Occupational Stress Questionnaire    Feeling of Stress : Only a little  Social Connections: Moderately Isolated (04/26/2023)   Social Connection and Isolation Panel [NHANES]    Frequency of Communication with Friends and Family: More than three times a week    Frequency of Social Gatherings with Friends and Family: Twice a week     Attends Religious Services: Never    Database administrator or Organizations: Yes    Attends Engineer, structural: More than 4 times per year    Marital Status: Divorced    Allergies: No Known Allergies  Metabolic Disorder Labs: Lab Results  Component Value Date   HGBA1C 5.4 10/12/2021   No results found for: "PROLACTIN" Lab Results  Component Value Date   CHOL 159 05/07/2023   TRIG 67 05/07/2023   HDL 63 05/07/2023   CHOLHDL 2.5 05/07/2023   VLDL 16.1 01/17/2023   LDLCALC 83 05/07/2023   LDLCALC 64 01/17/2023   Lab Results  Component Value  Date   TSH 1.00 01/17/2023   TSH 2.26 10/12/2021    Therapeutic Level Labs: No results found for: "LITHIUM" No results found for: "VALPROATE" No results found for: "CBMZ"  Current Medications: Current Outpatient Medications  Medication Sig Dispense Refill   aspirin 81 MG EC tablet Take 1 tablet (81 mg total) by mouth daily. 90 tablet 3   atorvastatin (LIPITOR) 20 MG tablet TAKE ONE TABLET BY MOUTH EVERY EVENING AT 6:00PM. 90 tablet 3   Cholecalciferol (VITAMIN D-3) 5000 units TABS Take 4,000 Units by mouth.     ezetimibe (ZETIA) 10 MG tablet Take 1 tablet (10 mg total) by mouth daily. 90 tablet 3   levothyroxine (SYNTHROID) 100 MCG tablet Take 1 30 minutes before meal in the am qd 90 tablet 3   nitroGLYCERIN (NITROSTAT) 0.4 MG SL tablet Place 1 tablet (0.4 mg total) under the tongue every 5 (five) minutes x 3 doses as needed for chest pain. 30 tablet 2   escitalopram (LEXAPRO) 5 MG tablet Take 1 tablet (5 mg total) by mouth daily. 90 tablet 3   No current facility-administered medications for this visit.     Musculoskeletal: Strength & Muscle Tone: within normal limits Gait & Station: normal Patient leans: N/A  Psychiatric Specialty Exam: Review of Systems  Psychiatric/Behavioral: Negative.      Blood pressure 129/83, pulse 67, temperature 97.6 F (36.4 C), temperature source Skin, height 5' 6.5" (1.689 m),  weight 162 lb (73.5 kg).Body mass index is 25.76 kg/m.  General Appearance: Fairly Groomed  Eye Contact:  Fair  Speech:  Clear and Coherent  Volume:  Normal  Mood:  Euthymic  Affect:  Congruent  Thought Process:  Goal Directed and Descriptions of Associations: Intact  Orientation:  Full (Time, Place, and Person)  Thought Content: Logical   Suicidal Thoughts:  No  Homicidal Thoughts:  No  Memory:  Immediate;   Fair Recent;   Fair Remote;   Fair  Judgement:  Fair  Insight:  Fair  Psychomotor Activity:  Normal  Concentration:  Concentration: Fair and Attention Span: Fair  Recall:  [employer] of Knowledge: Fair  Language: Fair  Akathisia:  No  Handed:  Right  AIMS (if indicated): not done  Assets:  Communication Skills Desire for Improvement Housing Social Support  ADL's:  Intact  Cognition: WNL  Sleep:  Fair   Screenings: Midwife Visit from 11/15/2021 in [facility]  AIMS Total Score 0      GAD-7    Flowsheet Row Office Visit from 05/15/2023 in [facility] Office Visit from 04/26/2023 in [facility] Visit from 01/22/2023 in [facility] [employer] Visit from 11/16/2022 in [facility] Office Visit from 05/17/2022 in [facility]  Total GAD-7 Score 0 2 1 1  0      PHQ2-9    Flowsheet Row Office Visit from 05/15/2023 in [facility] Office Visit from 04/26/2023 in [facility] Visit from 01[facility]22/2023 in [facility] Visit from 11/16/2022 in [facility] Office Visit from 05/17/2022 in [facility]  PHQ-2 Total  Score 0 2 0 1 1  PHQ-9 Total Score -- 2 1 2 3       Flowsheet  Row Office Visit from 05/15/2023 in [facility] Office Visit from 11/16/2022 in [facility] Office Visit from 05/17/2022 in [facility]  C-SSRS RISK CATEGORY No Risk No Risk No Risk        Assessment and Plan: Maureen Pena is a 67 year old Caucasian female, divorced, lives in [place], has a history of depression, GERD, hypothyroidism was evaluated in office today.  Patient currently managing her mood symptoms on the Lexapro however with concerns of hyperlipidemia, will benefit from an as noted below.  Plan MDD in remission Lexapro 5 mg p.o. daily  Smoking Cessation Successfully quit smoking with Chantix but had a brief relapse in July. Currently not smoking and not taking Chantix. - Monitor for signs of relapse - Provide support and resources for maintaining smoking cessation  High risk medication use-reviewed and discussed labs-01/17/2023-TSH-within normal limits.  Sodium reviewed and discussed with patient-142-within normal limits. I have ordered platelet count-patient provided lab slip.  Needs to be monitored when on medications like Lexapro.  Frustrated with elevated cholesterol despite lifestyle modifications and current statin and Zetia regimen. Recent lipid panel showed LDL increase by 15 points from August to November 26th, still within range but above target of 55 due to risk profile. Hesitant about Repatha due to cost, side effects, and preference for oral medication. Discussed hypothyroidism's impact on cholesterol. On Synthroid 100 mcg. Recent TSH and T3 levels at low end of normal range. Suspects current dose may be insufficient, potentially affecting cholesterol levels. Considering consulting an endocrinologist.  Follow-up - Schedule follow-up appointment in 6-8 months - Order platelet  count during next primary care visit.   Collaboration of Care: Collaboration of Care: Other patient encouraged to follow up with endocrinology/primary care provider for further management of her hyperlipidemia as well as connection with her thyroid profile.  Patient/Guardian was advised Release of Information must be obtained prior to any record release in order to collaborate their care with an outside provider. Patient/Guardian was advised if they have not already done so to contact the registration department to sign all necessary forms in order for [place] to release information regarding their care.   Consent: Patient/Guardian gives verbal consent for treatment and assignment of benefits for services provided during this visit. Patient/Guardian expressed understanding and agreed to proceed.   This note was generated in part or whole with voice recognition software. Voice recognition is usually quite accurate but there are transcription errors that can and very often do occur. I apologize for any typographical errors that were not detected and corrected.    Jomarie Longs, MD 05/16/2023, 9:27 AM

## 2023-05-16 ENCOUNTER — Other Ambulatory Visit: Payer: Self-pay | Admitting: Cardiovascular Disease

## 2023-05-16 DIAGNOSIS — I2510 Atherosclerotic heart disease of native coronary artery without angina pectoris: Secondary | ICD-10-CM

## 2023-05-16 DIAGNOSIS — E785 Hyperlipidemia, unspecified: Secondary | ICD-10-CM

## 2023-05-20 ENCOUNTER — Telehealth: Payer: Self-pay | Admitting: Emergency Medicine

## 2023-05-20 DIAGNOSIS — Z79899 Other long term (current) drug therapy: Secondary | ICD-10-CM

## 2023-05-20 MED ORDER — ROSUVASTATIN CALCIUM 20 MG PO TABS: 20.0000 mg | ORAL_TABLET | Freq: Every day | ORAL | 3 refills | Status: DC

## 2023-05-20 NOTE — Telephone Encounter (Signed)
Called patient and notified her of the following from Dr. Mariah Milling.  Likely unrelated to underlying thyroid disease recent normal thyroid numbers (TSH)  Regardless, cholesterol number needs to be lower  She can hold Lipitor changed to Crestor 20 with Zetia 10  Repeat lipids in 3 months time, suspect we will need Crestor 40 with Zetia 10 to even come close  Thx  TG   Patient verbalizes understanding. Prescription sent to preferred pharmacy.

## 2023-06-03 ENCOUNTER — Ambulatory Visit
Admission: RE | Admit: 2023-06-03 | Discharge: 2023-06-03 | Disposition: A | Discharge status: Home / Self Care | Payer: Medicare Other | Source: Ambulatory Visit | Attending: Family Medicine | Admitting: Family Medicine

## 2023-06-03 DIAGNOSIS — Z1231 Encounter for screening mammogram for malignant neoplasm of breast: Secondary | ICD-10-CM | POA: Insufficient documentation

## 2023-06-19 ENCOUNTER — Ambulatory Visit
Admission: RE | Admit: 2023-06-19 | Discharge: 2023-06-19 | Disposition: A | Discharge status: Home / Self Care | Payer: Medicare Other | Source: Ambulatory Visit | Attending: Family Medicine | Admitting: Family Medicine

## 2023-06-19 DIAGNOSIS — Z780 Asymptomatic menopausal state: Secondary | ICD-10-CM | POA: Diagnosis not present

## 2023-06-19 DIAGNOSIS — M810 Age-related osteoporosis without current pathological fracture: Secondary | ICD-10-CM | POA: Diagnosis not present

## 2023-06-20 ENCOUNTER — Other Ambulatory Visit: Payer: Self-pay | Admitting: Family

## 2023-06-20 MED ORDER — ALENDRONATE SODIUM 70 MG PO TABS: 70.0000 mg | ORAL_TABLET | ORAL | 11 refills | Status: DC

## 2023-06-21 ENCOUNTER — Encounter: Payer: Self-pay | Admitting: Family

## 2023-06-24 ENCOUNTER — Telehealth: Payer: Self-pay

## 2023-06-24 ENCOUNTER — Telehealth: Payer: Self-pay | Admitting: Family Medicine

## 2023-06-24 NOTE — Telephone Encounter (Signed)
-----   Message from Kenney KATHEE Roys sent at 06/20/2023  5:40 PM EST ----- Bone Density shows osteoporosis (bone loss). We typically recommend a medication to help with bone loss that is taken once a week to help. I will send in Fosamax  to the pharmacy to be taken with a full glass of water once a week.

## 2023-06-24 NOTE — Telephone Encounter (Signed)
 Is there transfer ok? Please advise. Thank you!  Copied from CRM [id number]. Topic: Appointments - Transfer of Care >> Jun 24, 2023  1:45 PM Leotis ORN wrote: Pt is requesting to transfer FROM: [facility] Pt is requesting to transfer TO: [facility]  Reason for requested transfer: Pt is not satisfied with Dr. Glenys Ferrari, or the [facility] location in general, she has requested to be transferred to [facility] to Establish care, med management, and discuss bone density results  PT is scheduled for 10/28/23   It is the responsibility of the team the patient would like to transfer to (Dr. Laine Balls) to reach out to the patient if for any reason this transfer is not acceptable.

## 2023-06-24 NOTE — Telephone Encounter (Signed)
 Left message to return call to our office.  Pt need to set up an appointment with Dr. Clent Ridges to discuss medication and bone density results.

## 2023-06-24 NOTE — Telephone Encounter (Signed)
 Dr. Milinda Antis isn't taking transfer pt and no new pt's over 60, this needs to be r/s to another provider and Dr. Milinda Antis didn't give the okay before it was scheduled

## 2023-06-25 ENCOUNTER — Other Ambulatory Visit: Payer: Self-pay | Admitting: Family Medicine

## 2023-06-25 NOTE — Telephone Encounter (Signed)
 I started a CRM yesterday for review. Not sure if you want [place] to call and let patient know we need to cancel of you wanted to call .

## 2023-08-19 DIAGNOSIS — Z79899 Other long term (current) drug therapy: Secondary | ICD-10-CM | POA: Diagnosis not present

## 2023-08-20 LAB — LIPID PANEL
Chol/HDL Ratio: 2.1 ratio (ref 0.0–4.4)
Cholesterol, Total: 130 mg/dL (ref 100–199)
HDL: 61 mg/dL (ref >39)
LDL Chol Calc (NIH): 52 mg/dL (ref 0–99)
Triglycerides: 93 mg/dL (ref 0–149)
VLDL Cholesterol Cal: 17 mg/dL (ref 5–40)

## 2023-09-25 ENCOUNTER — Ambulatory Visit: Payer: Medicare Other | Admitting: Dermatology

## 2023-09-25 ENCOUNTER — Encounter: Payer: Self-pay | Admitting: Dermatology

## 2023-09-25 VITALS — BP 151/97

## 2023-09-25 DIAGNOSIS — L821 Other seborrheic keratosis: Secondary | ICD-10-CM

## 2023-09-25 DIAGNOSIS — L820 Inflamed seborrheic keratosis: Secondary | ICD-10-CM | POA: Diagnosis not present

## 2023-09-25 DIAGNOSIS — Z1283 Encounter for screening for malignant neoplasm of skin: Secondary | ICD-10-CM

## 2023-09-25 DIAGNOSIS — D229 Melanocytic nevi, unspecified: Secondary | ICD-10-CM

## 2023-09-25 DIAGNOSIS — D1801 Hemangioma of skin and subcutaneous tissue: Secondary | ICD-10-CM

## 2023-09-25 DIAGNOSIS — L578 Other skin changes due to chronic exposure to nonionizing radiation: Secondary | ICD-10-CM | POA: Diagnosis not present

## 2023-09-25 DIAGNOSIS — L814 Other melanin hyperpigmentation: Secondary | ICD-10-CM

## 2023-09-25 DIAGNOSIS — I878 Other specified disorders of veins: Secondary | ICD-10-CM | POA: Diagnosis not present

## 2023-09-25 DIAGNOSIS — W908XXA Exposure to other nonionizing radiation, initial encounter: Secondary | ICD-10-CM | POA: Diagnosis not present

## 2023-09-25 NOTE — Progress Notes (Signed)
   New Patient Visit   Subjective  Maureen Pena is a 68 y.o. female who presents for the following:  Total Body Skin Exam (TBSE)  Patient present today for new patient visit for TBSE.The patient denies she has spots, moles and lesions to be evaluated, some may be new or changing and the patient may have concern these could be cancer. Patient has previously been treated by a dermatologist.Patient reports she does not have hx of bx. Patient denies family history of skin cancers. Patient reports throughout her lifetime has had complete sun exposure. Currently, patient reports if she has excessive sun exposure, she does apply sunscreen and/or wears protective coverings.  The following portions of the chart were reviewed this encounter and updated as appropriate: medications, allergies, medical history  Review of Systems:  No other skin or systemic complaints except as noted in HPI or Assessment and Plan.  Objective  Well appearing patient in no apparent distress; mood and affect are within normal limits.  A full examination was performed including scalp, head, eyes, ears, nose, lips, neck, chest, axillae, abdomen, back, buttocks, bilateral upper extremities, bilateral lower extremities, hands, feet, fingers, toes, fingernails, and toenails. All findings within normal limits unless otherwise noted below.    Relevant exam findings are noted in the Assessment and Plan.  Left Breast, Right breast (5), Left Temple & hairline   Assessment & Plan   LENTIGINES, SEBORRHEIC KERATOSES, HEMANGIOMAS - Benign normal skin lesions - Benign-appearing - Call for any changes  MELANOCYTIC NEVI - Tan-brown and/or pink-flesh-colored symmetric macules and papules - Benign appearing on exam today - Observation - Call clinic for new or changing moles - Recommend daily use of broad spectrum spf 30+ sunscreen to sun-exposed areas.   ACTINIC DAMAGE - Chronic condition, secondary to cumulative UV/sun  exposure - diffuse scaly erythematous macules with underlying dyspigmentation - Recommend daily broad spectrum sunscreen SPF 30+ to sun-exposed areas, reapply every 2 hours as needed.  - Staying in the shade or wearing long sleeves, sun glasses (UVA+UVB protection) and wide brim hats (4-inch brim around the entire circumference of the hat) are also recommended for sun protection.  - Call for new or changing lesions.  VENOUS LAKE Exam: red or purple papule at vermilion lip  Treatment Plan: Benign-appearing. Observe   SKIN CANCER SCREENING PERFORMED TODAY  INFLAMED SEBORRHEIC KERATOSIS (6) Left Breast, Right breast (5), Left Temple & hairline Destruction of lesion - Left Breast, Right breast (5) Complexity: simple   Destruction method: cryotherapy   Informed consent: discussed and consent obtained   Timeout:  patient name, date of birth, surgical site, and procedure verified Lesion destroyed using liquid nitrogen: Yes   Region frozen until ice ball extended beyond lesion: Yes   Outcome: patient tolerated procedure well with no complications   Post-procedure details: wound care instructions given    Return in about 1 year (around 09/24/2024) for TBSE.   Documentation: I have reviewed the above documentation for accuracy and completeness, and I agree with the above.   I, Shirron Louanne Roussel, CMA, am acting as scribe for [employer], DO.   Louana Roup, DO

## 2023-09-25 NOTE — Patient Instructions (Addendum)

## 2023-10-18 ENCOUNTER — Telehealth: Payer: Self-pay

## 2023-10-18 NOTE — Telephone Encounter (Signed)
 Attempted to reach out to pt concerning appointment with Dr. Malissa Se 10/28/23.  Pt is outside of age range that Dr. Malissa Se is currently accepting.  [facility] has attempted to reach out and reschedule pt.  Last documentation on CRM: [phone] notes that pt will call back to reschedule after looking though available providers.  Left a voicemail that I was canceling the appointment on 10/28/23, please call the office to schedule an appt with an available provider.

## 2023-10-28 ENCOUNTER — Ambulatory Visit: Payer: Medicare Other | Admitting: Family Medicine

## 2023-12-18 ENCOUNTER — Encounter: Payer: Self-pay | Admitting: Psychiatry

## 2023-12-18 ENCOUNTER — Ambulatory Visit: Payer: Self-pay | Admitting: Psychiatry

## 2023-12-18 VITALS — BP 118/82 | HR 78 | Temp 98.0°F | Ht 66.5 in | Wt 154.0 lb

## 2023-12-18 DIAGNOSIS — F3342 Major depressive disorder, recurrent, in full remission: Secondary | ICD-10-CM | POA: Diagnosis not present

## 2023-12-18 DIAGNOSIS — F17200 Nicotine dependence, unspecified, uncomplicated: Secondary | ICD-10-CM | POA: Diagnosis not present

## 2023-12-18 MED ORDER — VARENICLINE TARTRATE 0.5 MG PO TABS: 0.5000 mg | ORAL_TABLET | Freq: Two times a day (BID) | ORAL | 2 refills | Status: DC

## 2023-12-18 NOTE — Patient Instructions (Addendum)
 [email or web address] Lavenia Beaver, MD [address] [place], [place] [address]  [phone]

## 2023-12-18 NOTE — Progress Notes (Unsigned)
 [facility] MD OP Progress Note  12/18/2023 9:36 AM [name]  MRN:  [id number]  Chief Complaint:  Chief Complaint  Patient presents with   Follow-up   Anxiety   Depression   Medication Refill    Discussed the use of AI scribe software for clinical note transcription with the patient, who gave verbal consent to proceed.  History of Present Illness Maureen Pena is a 68 year old Caucasian female, divorced, retired, in [place], has a history of MDD, tobacco use disorder, hypothyroidism was evaluated in office today.   Approximately six to seven months ago, she resumed smoking due to stress related to the election, currently smoking eight to ten cigarettes a day. This has led to the return of her cough. She previously quit smoking using Chantix , which was effective, and she is interested in attempting cessation again.  She feels 'down ' due to the current political climate, drawing parallels to her mother's experiences during World War II. She experiences feelings of powerlessness and frustration, noting that she is typically an 'action-oriented person' and finds the current situation challenging.   She maintains a vegetable garden and is involved in [employer] and friend groups to stay active. She has two dogs and usually takes them for morning walks, although her irregular sleep schedule has led to missed walks. She takes naps in the afternoon and sometimes has difficulty falling asleep at night.  She denies any suicidality, homicidality or perceptual disturbances.  She is currently taking Lexapro  5 mg, rosuvastatin , Synthroid , and Zetia . After switching from atorvastatin  to rosuvastatin , her LDL levels have improved and her hip pain has resolved.  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 She also faces challenges in finding a new primary care physician after her previous doctor left, noting difficulties with  availability and acceptance of geriatric patients.   Visit Diagnosis:    ICD-10-CM   1. MDD (major depressive disorder), recurrent, in full remission (HCC)  F33.42     2. Tobacco use disorder  F17.200 varenicline  (CHANTIX ) 0.5 MG tablet      Past Psychiatric History: I have reviewed past psychiatric history from progress note on 02/06/2018.  Past trials of Lexapro , hydroxyzine .  Past Medical History:  Past Medical History:  Diagnosis Date   Anxiety    Depression    Diverticulitis    History of chicken pox    Thyroid  disease    hypothyroidism   UTI (urinary tract infection)     Past Surgical History:  Procedure Laterality Date   ESOPHAGOGASTRODUODENOSCOPY (EGD) WITH PROPOFOL  N/A 05/02/2018   Procedure: ESOPHAGOGASTRODUODENOSCOPY (EGD) WITH PROPOFOL ;  Surgeon: Jinny Carmine, MD;  Location: [facility];  Service: Endoscopy;  Laterality: N/A;   LEFT HEART CATH AND CORONARY ANGIOGRAPHY N/A 08/20/2019   Procedure: LEFT HEART CATH AND CORONARY ANGIOGRAPHY;  Surgeon: Perla Evalene PARAS, MD;  Location: [facility];  Service: Cardiovascular;  Laterality: N/A;   TUBAL LIGATION      Family Psychiatric History: I have reviewed family psychiatric history from progress note on 02/06/2018.  Family History:  Family History  Problem Relation Age of Onset   Depression Mother    Anxiety disorder Mother    Schizophrenia Mother    Paranoid behavior Mother    Stroke Mother    COPD Father    Early death Father    Hearing loss Father    Heart disease Father    CAD Father  s/p cabg x 3    Depression Brother    COPD Brother    Hypertension Brother    Kidney disease Brother    Pulmonary fibrosis Brother        died 12 2020/08/08   Breast cancer Neg Hx     Social History: I have reviewed social history from my progress note on 02/06/2018. Social History   Socioeconomic History   Marital status: Divorced    Spouse name: Not on file   Number of children: 2   Years of  education: Not on file   Highest education level: Professional school degree (e.g., MD, DDS, DVM, JD)  Occupational History    Comment: part time  Tobacco Use   Smoking status: Every Day    Current packs/day: 0.00    Types: Cigarettes    Last attempt to quit: 07/14/2022    Years since quitting: 1.4   Smokeless tobacco: Never  Vaping Use   Vaping status: Never Used  Substance and Sexual Activity   Alcohol use: Not Currently    Alcohol/week: 1.0 standard drink of alcohol    Types: 1 Glasses of wine per week   Drug use: Yes    Types: Marijuana   Sexual activity: Not Currently  Other Topics Concern   Not on file  Social History Narrative   Kids    No guns    Wears seat belt    Safe in relationship    Smoker    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    Divorced    Social Drivers of Corporate investment banker Strain: Low Risk  (04/26/2023)   Overall Financial Resource Strain (CARDIA)    Difficulty of Paying Living Expenses: Not hard at all  Food Insecurity: No Food Insecurity (04/26/2023)   Hunger Vital Sign    Worried About Running Out of Food in the Last Year: Never true    Ran Out of Food in the Last Year: Never true  Transportation Needs: No Transportation Needs (04/26/2023)   PRAPARE - Administrator, Civil Service (Medical): No    Lack of Transportation (Non-Medical): No  Physical Activity: Sufficiently Active (04/26/2023)   Exercise Vital Sign    Days of Exercise per Week: 7 days    Minutes of Exercise per Session: 120 min  Stress: No Stress Concern Present (04/26/2023)   [employer] of Occupational Health - Occupational Stress Questionnaire    Feeling of Stress : Only a little  Social Connections: Moderately Isolated (04/26/2023)   Social Connection and Isolation Panel    Frequency of Communication with Friends and Family: More than three times a week    Frequency of Social Gatherings with Friends and Family: Twice a week    Attends  Religious Services: Never    Database administrator or Organizations: Yes    Attends Engineer, structural: More than 4 times per year    Marital Status: Divorced    Allergies: No Known Allergies  Metabolic Disorder Labs: Lab Results  Component Value Date   HGBA1C 5.4 10/12/2021   No results found for: PROLACTIN Lab Results  Component Value Date   CHOL 130 08/19/2023   TRIG 93 08/19/2023   HDL 61 08/19/2023   CHOLHDL 2.1 08/19/2023   VLDL 82.5 01/17/2023   LDLCALC 52 08/19/2023   LDLCALC 83 05/07/2023   Lab Results  Component Value Date   TSH 1.00 01/17/2023   TSH 2.26  10/12/2021    Therapeutic Level Labs: No results found for: LITHIUM No results found for: VALPROATE No results found for: CBMZ  Current Medications: Current Outpatient Medications  Medication Sig Dispense Refill   aspirin  81 MG EC tablet Take 1 tablet (81 mg total) by mouth daily. 90 tablet 3   Cholecalciferol (VITAMIN D -3) 5000 units TABS Take 4,000 Units by mouth.     escitalopram  (LEXAPRO ) 5 MG tablet Take 1 tablet (5 mg total) by mouth daily. 90 tablet 3   ezetimibe  (ZETIA ) 10 MG tablet TAKE 1 TABLET BY MOUTH DAILY **NEED OFFICE VISIT FOR FURTHER REFILLS** 90 tablet 3   levothyroxine  (SYNTHROID ) 100 MCG tablet Take 1 30 minutes before meal in the am qd 90 tablet 3   nitroGLYCERIN  (NITROSTAT ) 0.4 MG SL tablet Place 1 tablet (0.4 mg total) under the tongue every 5 (five) minutes x 3 doses as needed for chest pain. 30 tablet 2   rosuvastatin  (CRESTOR ) 20 MG tablet Take 1 tablet (20 mg total) by mouth daily. 90 tablet 3   varenicline  (CHANTIX ) 0.5 MG tablet Take 1 tablet (0.5 mg total) by mouth 2 (two) times daily. 60 tablet 2   No current facility-administered medications for this visit.     Musculoskeletal: Strength & Muscle Tone: within normal limits Gait & Station: normal Patient leans: N/A  Psychiatric Specialty Exam: Review of Systems  Psychiatric/Behavioral:  The patient  is nervous/anxious.     Blood pressure 118/82, pulse 78, temperature 98 F (36.7 C), temperature source Temporal, height 5' 6.5 (1.689 m), weight 154 lb (69.9 kg), SpO2 91%.Body mass index is 24.48 kg/m.  General Appearance: Casual  Eye Contact:  Fair  Speech:  Clear and Coherent  Volume:  Normal  Mood:  Anxious  Affect:  Appropriate  Thought Process:  Goal Directed and Descriptions of Associations: Intact  Orientation:  Full (Time, Place, and Person)  Thought Content: Logical   Suicidal Thoughts:  No  Homicidal Thoughts:  No  Memory:  Immediate;   Fair Recent;   Fair Remote;   Fair  Judgement:  Fair  Insight:  Fair  Psychomotor Activity:  Normal  Concentration:  Concentration: Fair and Attention Span: Fair  Recall:  [employer] of Knowledge: Fair  Language: Fair  Akathisia:  No  Handed:  Right  AIMS (if indicated): not done  Assets:  Manufacturing systems engineer Desire for Improvement Housing Social Support Transportation  ADL's:  Intact  Cognition: WNL  Sleep:  Varies   Screenings: Geneticist, molecular Office Visit from 11/15/2021 in [facility]  AIMS Total Score 0   GAD-7    Flowsheet Row Office Visit from 12/18/2023 in [facility] Office Visit from 05/15/2023 in [facility] Office Visit from 04/26/2023 in [facility] [employer] Visit from 01/22/2023 in [facility] [employer] Visit from 11/16/2022 in [facility]  Total GAD-7 Score 2 0 2 1 1    PHQ2-9    Flowsheet Row Office Visit from 12/18/2023 in [facility] Office Visit from 05/15/2023 in [facility] Office Visit from 04/26/2023 in [facility] Visit from 01/22/2023 in  [facility] Visit from 11/16/2022 in [facility]  PHQ-2 Total Score 2 0 2 0 1  PHQ-9 Total Score 5 -- 2 1 2    Flowsheet Row Office Visit from 12/18/2023 in [facility] Office Visit from 05/15/2023 in [facility] Office Visit from 11/16/2022 in [facility]  C-SSRS RISK CATEGORY No Risk No Risk No Risk     Assessment and Plan: Nazarene Bunning is a 68 year old Caucasian female, divorced, lives in [place], has a history of depression, GERD, hypothyroidism was evaluated in office today.  Discussed assessment and plan as noted below.  MDD in remission Currently denies any significant depression symptoms. Continue Lexapro  5 mg daily  Tobacco use disorder-unstable Interested in quitting again.  Previously quit smoking on Chantix . Restart Chantix  0.5 mg twice daily. Provided counseling for 1 minute.  Pending labs, sodium level, platelet count.  Patient agrees to get it done at primary care provider's visit.   Follow-up Follow-up in clinic in 5 to 6 months or sooner if needed.   Collaboration of Care: Collaboration of Care: Primary Care Provider AEB encouraged to establish care with a provider or follow-up with current provider.  Patient/Guardian was advised Release of Information must be obtained prior to any record release in order to collaborate their care with an outside provider. Patient/Guardian was advised if they have not already done so to contact the registration department to sign all necessary forms in order for us  to release information regarding their care.   Consent: Patient/Guardian gives verbal consent for treatment and assignment of benefits for services provided during this visit. Patient/Guardian expressed understanding and agreed to proceed.  This note was  generated in part or whole with voice recognition software. Voice recognition is usually quite accurate but there are transcription errors that can and very often do occur. I apologize for any typographical errors that were not detected and corrected.     Sylvan Sookdeo, MD 12/19/2023, 9:24 AM

## 2023-12-25 DIAGNOSIS — H2513 Age-related nuclear cataract, bilateral: Secondary | ICD-10-CM | POA: Diagnosis not present

## 2023-12-25 DIAGNOSIS — H04123 Dry eye syndrome of bilateral lacrimal glands: Secondary | ICD-10-CM | POA: Diagnosis not present

## 2023-12-25 DIAGNOSIS — D3132 Benign neoplasm of left choroid: Secondary | ICD-10-CM | POA: Diagnosis not present

## 2024-02-13 ENCOUNTER — Other Ambulatory Visit: Payer: Self-pay

## 2024-02-13 DIAGNOSIS — E039 Hypothyroidism, unspecified: Secondary | ICD-10-CM

## 2024-02-13 MED ORDER — LEVOTHYROXINE SODIUM 100 MCG PO TABS: ORAL_TABLET | ORAL | 0 refills | Status: DC

## 2024-04-09 ENCOUNTER — Ambulatory Visit (INDEPENDENT_AMBULATORY_CARE_PROVIDER_SITE_OTHER)

## 2024-04-09 VITALS — BP 120/70 | HR 76 | Temp 98.7°F | Ht 66.5 in | Wt 148.4 lb

## 2024-04-09 DIAGNOSIS — D751 Secondary polycythemia: Secondary | ICD-10-CM | POA: Insufficient documentation

## 2024-04-09 DIAGNOSIS — R030 Elevated blood-pressure reading, without diagnosis of hypertension: Secondary | ICD-10-CM | POA: Insufficient documentation

## 2024-04-09 DIAGNOSIS — E039 Hypothyroidism, unspecified: Secondary | ICD-10-CM | POA: Diagnosis not present

## 2024-04-09 DIAGNOSIS — M810 Age-related osteoporosis without current pathological fracture: Secondary | ICD-10-CM | POA: Diagnosis not present

## 2024-04-09 DIAGNOSIS — F17210 Nicotine dependence, cigarettes, uncomplicated: Secondary | ICD-10-CM | POA: Insufficient documentation

## 2024-04-09 DIAGNOSIS — Z1231 Encounter for screening mammogram for malignant neoplasm of breast: Secondary | ICD-10-CM | POA: Diagnosis not present

## 2024-04-09 DIAGNOSIS — Z87891 Personal history of nicotine dependence: Secondary | ICD-10-CM | POA: Insufficient documentation

## 2024-04-09 DIAGNOSIS — Z131 Encounter for screening for diabetes mellitus: Secondary | ICD-10-CM | POA: Diagnosis not present

## 2024-04-09 DIAGNOSIS — I10 Essential (primary) hypertension: Secondary | ICD-10-CM | POA: Insufficient documentation

## 2024-04-09 DIAGNOSIS — Z122 Encounter for screening for malignant neoplasm of respiratory organs: Secondary | ICD-10-CM | POA: Diagnosis not present

## 2024-04-09 DIAGNOSIS — I2510 Atherosclerotic heart disease of native coronary artery without angina pectoris: Secondary | ICD-10-CM

## 2024-04-09 DIAGNOSIS — R739 Hyperglycemia, unspecified: Secondary | ICD-10-CM | POA: Insufficient documentation

## 2024-04-09 DIAGNOSIS — E782 Mixed hyperlipidemia: Secondary | ICD-10-CM

## 2024-04-09 DIAGNOSIS — E785 Hyperlipidemia, unspecified: Secondary | ICD-10-CM

## 2024-04-09 NOTE — Assessment & Plan Note (Addendum)
 Patient is asymptomatic, no chest pain. Continue Aspirin  81 mg, Rosuvastatin  20 mg and zetia  10 mg daily.

## 2024-04-09 NOTE — Assessment & Plan Note (Addendum)
 Confirmed by DEXA scan in 06/2023.  Politely declines bisphosphonate therapy. - Encourage intake of calcium  600 mg once or twice daily, depending on dietary intake. - Continue vitamin D  5,000 units supplementation daily. - Discuss alternative osteoporosis treatments, including injectable medications and infusions, if she decides to pursue treatment in the future.

## 2024-04-09 NOTE — Assessment & Plan Note (Addendum)
 Previously mildly elevated hemoglobin. Suspected secondary to smoking. Repeat CBC.  Orders:   CBC with Differential/Platelet

## 2024-04-09 NOTE — Assessment & Plan Note (Deleted)
 Orders:    Hemoglobin A1c

## 2024-04-09 NOTE — Assessment & Plan Note (Addendum)
 Check A1c  Orders:    Hemoglobin A1c

## 2024-04-09 NOTE — Assessment & Plan Note (Addendum)
 Screening mammogram due in December, imaging ordered. Patient will call to schedule an appointment.  Orders:   MM 3D SCREENING MAMMOGRAM BILATERAL BREAST; Future

## 2024-04-09 NOTE — Assessment & Plan Note (Addendum)
 Chronic, check lipid panel today. On Rosuvastatin  20 mg, zetia  10 mg. Continue. Check CMP. Previously mildly elevated ALT.  Orders:   Lipid panel

## 2024-04-09 NOTE — Patient Instructions (Addendum)
 Please let me know if you change your mind about osteoporosis treatment.   Check BP 2- 3 times a week and keep a record to review together during your follow up visit. Your goal BP is less than 130/80. Follow up in 2-3 months.   Take Calcium  600 mg twice a day and continue vitamin D  supplement as it is.   PLEASE CALL AND GET THIS SCHEDULED! [facility] - call [phone]

## 2024-04-09 NOTE — Assessment & Plan Note (Addendum)
-   Chronic. Stable on 100 mcg of levothyroxine .  - Check TSH levels to assess current thyroid  function. Refill pending lab results.   Orders:   TSH   Comprehensive metabolic panel   Ambulatory Referral for Lung Cancer Scre

## 2024-04-09 NOTE — Assessment & Plan Note (Addendum)
-   Currently smoking 5-6 cigarettes per day, interested in restarting Chantix . Tolerated Chantix  in the past without adverse reaction.  - Prescribe Chantix .  - Monitor for side effects from Chantix , including vivid dreams and constipation. Follow up in 2-3 months.  - Also recommend updating RSV immunization through local pharmacy.

## 2024-04-09 NOTE — Assessment & Plan Note (Addendum)
 H/O smoking for about 50 years with current everyday smoker, smoking 5 sticks per day. Referred for low dose CT for lung cancer screening.  Orders:   Ambulatory Referral for Lung Cancer Scre

## 2024-04-09 NOTE — Assessment & Plan Note (Signed)
 Recent readings higher than usual.  - Monitor blood pressure at home 2-3 times a week and keep a record. - Aim for blood pressure less than 130/80 mmHg. - Consider antihypertensive medication if home readings consistently exceed target. Follow up in 2 months with home BP cuff, BP reading.

## 2024-04-09 NOTE — Progress Notes (Signed)
 Established Patient Office Visit TOC from Dr. Otelia. Randine   Subjective  Patient ID: Maureen Pena, female    DOB: 1955/11/06  Age: 68 y.o. MRN: [id number]  Chief Complaint  Patient presents with   Establish Care    Discussed the use of AI scribe software for clinical note transcription with the patient, who gave verbal consent to proceed.  History of Present Illness Maureen Pena is a 68 year old female who presents for a transfer of care appointment from previous PCP and chronic medication management.   - Osteoporosis: DEXA 06/19/23 with osteoporosis of L1-L2 spine -2.6, was rec Fosamax , Padonda sent a message on 06/20/23. . She prefers managing her condition with dietary sources of calcium  and vitamin D , consuming foods like yogurt, almond milk, and flax seeds, and maintains regular physical activity through walking and gardening.  - She has a long history of smoking for about 50 years, currently smoking five to six cigarettes daily. She previously quit smoking for nearly a year using Chantix  but resumed due to stress. She is interested in restarting Chantix  to aid in smoking cessation. No previous adverse reaction to Chantix  and is interested in restarting this to help with smoking cessation. She is interested in lung cancer screening but has faced challenges with insurance coverage for the test.  - She has a h/o CAD with previous h/o MI. She is on Rosuvastatin  220 mg daily, Zetia  10 mg daily.  Lipid 08/19/23:LDL 52. She follows up with cardiologist, Dr. Daina, annually. She was found to have mild elevation in ALT on lab from 01/17/23. She does not have abdominal pain, constipation.   - Her blood pressure has been higher than usual in recent appointments, which she attributes to stress and medication changes. She does not have a home blood pressure monitor but is considering obtaining one for more accurate tracking.   - She has Hashimoto's thyroiditis and has been on  Synthroid  for many years, with stable thyroid  levels. She is due for a TSH check to renew her prescription of Levothyroxine  100 mcg daily.  - She has a history of mildly elevated hemoglobin and is a former smoker.   - Her social history includes living with two dogs, with whom she walks regularly, and engaging in gardening and yard work.   - A1c normal on 10/12/21.    - She sees Dr. Eappen every 6 months and takes Escitalopram  5 mg daily.     ROS As per HPI    Objective:     BP 120/70   Pulse 76   Temp 98.7 F (37.1 C) (Oral)   Ht 5' 6.5 (1.689 m)   Wt 148 lb 6.4 oz (67.3 kg)   SpO2 93%   BMI 23.59 kg/m      04/09/2024    1:16 PM 12/18/2023    9:06 AM 05/15/2023   12:58 PM  Depression screen PHQ 2/9  Decreased Interest 1    Down, Depressed, Hopeless 0    PHQ - 2 Score 1    Altered sleeping 0    Tired, decreased energy 0    Change in appetite 0    Feeling bad or failure about yourself  0    Trouble concentrating 0    Moving slowly or fidgety/restless 0    Suicidal thoughts 0    PHQ-9 Score 1    Difficult doing work/chores Not difficult at all       Information is confidential and restricted. Go to Review  Flowsheets to unlock data.      04/09/2024    1:17 PM 12/18/2023    9:06 AM 05/15/2023   12:59 PM 04/26/2023    8:16 AM  GAD 7 : Generalized Anxiety Score  Nervous, Anxious, on Edge 1   1  Control/stop worrying 0   0  Worry too much - different things 0   0  Trouble relaxing 0   0  Restless 0   0  Easily annoyed or irritable 1   0  Afraid - awful might happen 0   1  Total GAD 7 Score 2   2  Anxiety Difficulty Not difficult at all   Not difficult at all     Information is confidential and restricted. Go to Review Flowsheets to unlock data.      04/09/2024    1:16 PM 12/18/2023    9:06 AM 05/15/2023   12:58 PM  Depression screen PHQ 2/9  Decreased Interest 1    Down, Depressed, Hopeless 0    PHQ - 2 Score 1    Altered sleeping 0    Tired, decreased  energy 0    Change in appetite 0    Feeling bad or failure about yourself  0    Trouble concentrating 0    Moving slowly or fidgety/restless 0    Suicidal thoughts 0    PHQ-9 Score 1    Difficult doing work/chores Not difficult at all       Information is confidential and restricted. Go to Review Flowsheets to unlock data.      04/09/2024    1:17 PM 12/18/2023    9:06 AM 05/15/2023   12:59 PM 04/26/2023    8:16 AM  GAD 7 : Generalized Anxiety Score  Nervous, Anxious, on Edge 1   1  Control/stop worrying 0   0  Worry too much - different things 0   0  Trouble relaxing 0   0  Restless 0   0  Easily annoyed or irritable 1   0  Afraid - awful might happen 0   1  Total GAD 7 Score 2   2  Anxiety Difficulty Not difficult at all   Not difficult at all     Information is confidential and restricted. Go to Review Flowsheets to unlock data.   SDOH Screenings   Food Insecurity: No Food Insecurity (04/07/2024)  Housing: Low Risk  (04/07/2024)  Transportation Needs: No Transportation Needs (04/07/2024)  Utilities: Not At Risk (04/26/2023)  Alcohol Screen: Low Risk  (04/07/2024)  Depression (PHQ2-9): Low Risk  (04/09/2024)  Financial Resource Strain: Low Risk  (04/07/2024)  Physical Activity: Sufficiently Active (04/07/2024)  Social Connections: Socially Isolated (04/07/2024)  Stress: No Stress Concern Present (04/07/2024)  Tobacco Use: High Risk (04/09/2024)  Health Literacy: Adequate Health Literacy (04/26/2023)     Physical Exam Constitutional:      Appearance: Normal appearance.  HENT:     Head: Normocephalic and atraumatic.     Right Ear: Tympanic membrane, ear canal and external ear normal.     Left Ear: Tympanic membrane, ear canal and external ear normal.     Mouth/Throat:     Mouth: Mucous membranes are moist.  Neck:     Thyroid : No thyroid  mass or thyroid  tenderness.  Cardiovascular:     Rate and Rhythm: Normal rate and regular rhythm.  Pulmonary:     Effort:  Pulmonary effort is normal.     Breath sounds: Normal breath sounds.  Abdominal:     General: Bowel sounds are normal.     Palpations: Abdomen is soft.     Tenderness: There is no abdominal tenderness. There is no guarding.  Musculoskeletal:     Cervical back: Neck supple. No rigidity.     Right lower leg: No edema.     Left lower leg: No edema.  Lymphadenopathy:     Cervical: No cervical adenopathy.  Skin:    General: Skin is warm.  Neurological:     Mental Status: She is alert and oriented to person, place, and time.  Psychiatric:        Mood and Affect: Mood normal.        Behavior: Behavior normal.        No results found for any visits on 04/09/24.  The ASCVD Risk score (Arnett DK, et al., 2019) failed to calculate for the following reasons:   Risk score cannot be calculated because patient has a medical history suggesting prior/existing ASCVD     Assessment & Plan:   Assessment & Plan Acquired hypothyroidism - Chronic. Stable on 100 mcg of levothyroxine .  - Check TSH levels to assess current thyroid  function. Refill pending lab results.   Orders:   TSH   Comprehensive metabolic panel   Ambulatory Referral for Lung Cancer Scre  Coronary artery disease involving native coronary artery of native heart without angina pectoris Patient is asymptomatic, no chest pain. Continue Aspirin  81 mg, Rosuvastatin  20 mg and zetia  10 mg daily.     Mixed hyperlipidemia Chronic, check lipid panel today. On Rosuvastatin  20 mg, zetia  10 mg. Continue. Check CMP. Previously mildly elevated ALT.  Orders:   Lipid panel  Polycythemia Previously mildly elevated hemoglobin. Suspected secondary to smoking. Repeat CBC.  Orders:   CBC with Differential/Platelet  Encounter for screening mammogram for breast cancer Screening mammogram due in December, imaging ordered. Patient will call to schedule an appointment.  Orders:   MM 3D SCREENING MAMMOGRAM BILATERAL BREAST;  Future  Screening for lung cancer H/O smoking for about 50 years with current everyday smoker, smoking 5 sticks per day. Referred for low dose CT for lung cancer screening.  Orders:   Ambulatory Referral for Lung Cancer Scre  Continuous dependence on cigarette smoking - Currently smoking 5-6 cigarettes per day, interested in restarting Chantix . Tolerated Chantix  in the past without adverse reaction.  - Prescribe Chantix .  - Monitor for side effects from Chantix , including vivid dreams and constipation. Follow up in 2-3 months.  - Also recommend updating RSV immunization through local pharmacy.     Encounter for screening examination for intermediate hyperglycemia and diabetes mellitus Check A1c. Orders:   Hemoglobin A1c  Age-related osteoporosis without current pathological fracture Confirmed by DEXA scan in 06/2023.  Politely declines bisphosphonate therapy. - Encourage intake of calcium  600 mg once or twice daily, depending on dietary intake. - Continue vitamin D  5,000 units supplementation daily. - Discuss alternative osteoporosis treatments, including injectable medications and infusions, if she decides to pursue treatment in the future.    Elevated blood pressure reading Recent readings higher than usual.  - Monitor blood pressure at home 2-3 times a week and keep a record. - Aim for blood pressure less than 130/80 mmHg. - Consider antihypertensive medication if home readings consistently exceed target. Follow up in 2 months with home BP cuff, BP reading.      I personally spent a total of 40 minutes in the care of the patient today including preparing to see  the patient, getting/reviewing separately obtained history, performing a medically appropriate exam/evaluation, counseling and educating, placing orders, documenting clinical information in the EHR, independently interpreting results, and communicating results.  Return for Smoking cessation f/u 2-3 months follow up.    Luke Shade, MD

## 2024-04-10 ENCOUNTER — Ambulatory Visit: Payer: Self-pay

## 2024-04-10 DIAGNOSIS — E039 Hypothyroidism, unspecified: Secondary | ICD-10-CM

## 2024-04-10 DIAGNOSIS — F17210 Nicotine dependence, cigarettes, uncomplicated: Secondary | ICD-10-CM

## 2024-04-10 LAB — COMPREHENSIVE METABOLIC PANEL WITH GFR
ALT: 14 U/L (ref 0–35)
AST: 13 U/L (ref 0–37)
Albumin: 4.2 g/dL (ref 3.5–5.2)
Alkaline Phosphatase: 67 U/L (ref 39–117)
BUN: 15 mg/dL (ref 6–23)
CO2: 27 meq/L (ref 19–32)
Calcium: 9.6 mg/dL (ref 8.4–10.5)
Chloride: 105 meq/L (ref 96–112)
Creatinine, Ser: 0.85 mg/dL (ref 0.40–1.20)
GFR: 71.17 mL/min (ref >60.00)
Glucose, Bld: 74 mg/dL (ref 70–99)
Potassium: 5 meq/L (ref 3.5–5.1)
Sodium: 141 meq/L (ref 135–145)
Total Bilirubin: 0.5 mg/dL (ref 0.2–1.2)
Total Protein: 6.3 g/dL (ref 6.0–8.3)

## 2024-04-10 LAB — CBC WITH DIFFERENTIAL/PLATELET
Basophils Absolute: 0.1 K/uL (ref 0.0–0.1)
Basophils Relative: 1.5 % (ref 0.0–3.0)
Eosinophils Absolute: 0.3 K/uL (ref 0.0–0.7)
Eosinophils Relative: 3.5 % (ref 0.0–5.0)
HCT: 44.2 % (ref 36.0–46.0)
Hemoglobin: 14.5 g/dL (ref 12.0–15.0)
Lymphocytes Relative: 36 % (ref 12.0–46.0)
Lymphs Abs: 2.9 K/uL (ref 0.7–4.0)
MCHC: 32.8 g/dL (ref 30.0–36.0)
MCV: 91.6 fl (ref 78.0–100.0)
Monocytes Absolute: 0.5 K/uL (ref 0.1–1.0)
Monocytes Relative: 5.9 % (ref 3.0–12.0)
Neutro Abs: 4.3 K/uL (ref 1.4–7.7)
Neutrophils Relative %: 53.1 % (ref 43.0–77.0)
Platelets: 218 K/uL (ref 150.0–400.0)
RBC: 4.82 Mil/uL (ref 3.87–5.11)
RDW: 14.3 % (ref 11.5–15.5)
WBC: 8 K/uL (ref 4.0–10.5)

## 2024-04-10 LAB — TSH: TSH: 0.42 u[IU]/mL (ref 0.35–5.50)

## 2024-04-10 LAB — LIPID PANEL
Cholesterol: 128 mg/dL (ref 0–200)
HDL: 61.7 mg/dL (ref >39.00)
LDL Cholesterol: 51 mg/dL (ref 0–99)
NonHDL: 65.93
Total CHOL/HDL Ratio: 2
Triglycerides: 77 mg/dL (ref 0.0–149.0)
VLDL: 15.4 mg/dL (ref 0.0–40.0)

## 2024-04-10 LAB — HEMOGLOBIN A1C: Hgb A1c MFr Bld: 5.4 % (ref 4.6–6.5)

## 2024-04-10 MED ORDER — VARENICLINE TARTRATE 0.5 MG PO TABS: ORAL_TABLET | ORAL | 0 refills | Status: AC

## 2024-04-10 MED ORDER — LEVOTHYROXINE SODIUM 100 MCG PO TABS: ORAL_TABLET | ORAL | 3 refills | Status: AC

## 2024-04-20 ENCOUNTER — Other Ambulatory Visit: Payer: Self-pay | Admitting: *Deleted

## 2024-04-20 ENCOUNTER — Telehealth: Payer: Self-pay | Admitting: *Deleted

## 2024-04-20 DIAGNOSIS — Z87891 Personal history of nicotine dependence: Secondary | ICD-10-CM

## 2024-04-20 DIAGNOSIS — Z122 Encounter for screening for malignant neoplasm of respiratory organs: Secondary | ICD-10-CM

## 2024-04-20 DIAGNOSIS — F17210 Nicotine dependence, cigarettes, uncomplicated: Secondary | ICD-10-CM

## 2024-04-20 NOTE — Telephone Encounter (Signed)
 Lung Cancer Screening Narrative/Criteria Questionnaire (Cigarette Smokers Only- No Cigars/Pipes/vapes)   Maureen Pena   SDMV:04/29/24 9:15- [name]                                           Jan 26, 1956              LDCT: 04/30/24 9:30- [name]    68 y.o.   Phone: [phone]  Lung Screening Narrative (confirm age 17-77 yrs Medicare / 50-80 yrs Private pay insurance)   Insurance information:[employer] [place]   Referring Provider:Bair   This screening involves an initial phone call with a team member from our program. It is called a shared decision making visit. The initial meeting is required by insurance and Medicare to make sure you understand the program. This appointment takes about 15-20 minutes to complete. The CT scan will completed at a separate date/time. This scan takes about 5-10 minutes to complete and you may eat and drink before and after the scan.  Criteria questions for Lung Cancer Screening:   Are you a current or former smoker? Current Age began smoking: 15   If you are a former smoker, what year did you quit smoking? (within 15 yrs)   To calculate your smoking history, I need an accurate estimate of how many packs of cigarettes you smoked per day and for how many years. (Not just the number of PPD you are now smoking)   Years smoking 52 x Packs per day 1/2 -3/4 = Pack years 30   (at least 20 pack yrs)   (Make sure they understand that we need to know how much they have smoked in the past, not just the number of PPD they are smoking now)  Do you have a personal history of cancer?  No    Do you have a family history of cancer? No  Are you coughing up blood?  No  Have you had unexplained weight loss of 15 lbs or more in the last 6 months? No  It looks like you meet all criteria.     Additional information: N/A

## 2024-04-29 ENCOUNTER — Encounter: Payer: Self-pay | Admitting: Adult Health

## 2024-04-29 ENCOUNTER — Ambulatory Visit (INDEPENDENT_AMBULATORY_CARE_PROVIDER_SITE_OTHER): Admitting: Adult Health

## 2024-04-29 DIAGNOSIS — F17210 Nicotine dependence, cigarettes, uncomplicated: Secondary | ICD-10-CM

## 2024-04-29 NOTE — Patient Instructions (Signed)

## 2024-04-29 NOTE — Progress Notes (Signed)
  Virtual Visit via Telephone Note  I connected with Maureen Pena , 04/29/24 9:23 AM by a telemedicine application and verified that I am speaking with the correct person using two identifiers.  Location: Patient: home Provider: home   I discussed the limitations of evaluation and management by telemedicine and the availability of in person appointments. The patient expressed understanding and agreed to proceed.   Shared Decision Making Visit Lung Cancer Screening Program [phone])   Eligibility: 68 y.o. Pack Years Smoking History Calculation = 30 pack years  (# packs/per year x # years smoked) Recent History of coughing up blood  no Unexplained weight loss? no ( >Than 15 pounds within the last 6 months ) Prior History Lung / other cancer no (Diagnosis within the last 5 years already requiring surveillance chest CT Scans). Smoking Status Current Smoker   Visit Components: Discussion included one or more decision making aids. YES Discussion included risk/benefits of screening. YES Discussion included potential follow up diagnostic testing for abnormal scans. YES Discussion included meaning and risk of over diagnosis. YES Discussion included meaning and risk of False Positives. YES Discussion included meaning of total radiation exposure. YES  Counseling Included: Importance of adherence to annual lung cancer LDCT screening. YES Impact of comorbidities on ability to participate in the program. YES Ability and willingness to under diagnostic treatment. YES  Smoking Cessation Counseling: Current Smokers:  Discussed importance of smoking cessation. yes Information about tobacco cessation classes and interventions provided to patient. yes Patient provided with ticket for LDCT Scan. yes Symptomatic Patient. NO Diagnosis Code: Tobacco Use Z72.0 Asymptomatic Patient yes  Counseling - 4 minutes of smoking cessation counseling (CT Chest Lung Cancer Screening Low Dose W/O  CM) [id number]  Smoking/Tobacco Cessation Counseling Maureen Pena is a current user of tobacco or nicotine products. She is ready to quit at this time. Counseling provided today addressed the risks of continued use and the benefits of cessation. Discussed tobacco/nicotine use history, readiness to quit, and evidence-based treatment options including behavioral strategies, support resources, and pharmacologic therapies. Provided encouragement and educational materials on steps and resources to quit smoking. Patient questions were addressed, and follow-up recommended for continued support. Total time spent on counseling: 4 minutes.    Z12.2-Screening of respiratory organs Z87.891-Personal history of nicotine dependence   Maureen Pena 04/29/24

## 2024-04-30 ENCOUNTER — Ambulatory Visit
Admission: RE | Admit: 2024-04-30 | Discharge: 2024-04-30 | Disposition: A | Discharge status: Home / Self Care | Source: Ambulatory Visit | Attending: Acute Care | Admitting: Acute Care

## 2024-04-30 DIAGNOSIS — F17210 Nicotine dependence, cigarettes, uncomplicated: Secondary | ICD-10-CM | POA: Insufficient documentation

## 2024-04-30 DIAGNOSIS — Z122 Encounter for screening for malignant neoplasm of respiratory organs: Secondary | ICD-10-CM | POA: Insufficient documentation

## 2024-04-30 DIAGNOSIS — Z87891 Personal history of nicotine dependence: Secondary | ICD-10-CM | POA: Diagnosis present

## 2024-05-10 ENCOUNTER — Other Ambulatory Visit: Payer: Self-pay | Admitting: Cardiovascular Disease

## 2024-05-10 ENCOUNTER — Other Ambulatory Visit: Payer: Self-pay | Admitting: Psychiatry

## 2024-05-10 DIAGNOSIS — F3342 Major depressive disorder, recurrent, in full remission: Secondary | ICD-10-CM

## 2024-05-10 DIAGNOSIS — I25118 Atherosclerotic heart disease of native coronary artery with other forms of angina pectoris: Secondary | ICD-10-CM

## 2024-05-10 DIAGNOSIS — E785 Hyperlipidemia, unspecified: Secondary | ICD-10-CM

## 2024-05-10 DIAGNOSIS — I2510 Atherosclerotic heart disease of native coronary artery without angina pectoris: Secondary | ICD-10-CM

## 2024-05-11 ENCOUNTER — Ambulatory Visit: Payer: Self-pay

## 2024-05-11 ENCOUNTER — Telehealth: Payer: Self-pay | Admitting: *Deleted

## 2024-05-11 ENCOUNTER — Telehealth: Payer: Self-pay | Admitting: Acute Care

## 2024-05-11 NOTE — Telephone Encounter (Signed)
 LR 3, 6 month follow up on this is fine. This will be due 10/2024. Please let PCP know plan. Thanks so much

## 2024-05-11 NOTE — Telephone Encounter (Signed)
 Call report from [facility]:  IMPRESSION: 1. Pulmonary nodules measure up to 6.9 mm in the left lower lobe. Lung-RADS 3, probably benign findings. Short-term follow-up in 6 months is recommended with repeat low-dose chest CT without contrast (please use the following order, CT CHEST LCS NODULE FOLLOW-UP W/O CM). These results will be called to the ordering clinician or representative by the Radiologist Assistant, and communication documented in the PACS or [employer]. 2.  Aortic atherosclerosis (ICD10-I70.0). 3.  Emphysema (ICD10-J43.9).

## 2024-05-11 NOTE — Telephone Encounter (Signed)
 FYI Only or Action Required?: FYI only for provider: appointment scheduled on 05/15/24.  Patient was last seen in primary care on 04/09/2024 by Abbey Bruckner, MD.  Called Nurse Triage reporting Anxiety and Hypertension.  Symptoms began several months ago.  Interventions attempted: Nothing.  Symptoms are: stable.  Triage Disposition: See PCP When Office is Open (Within 3 Days)  Patient/caregiver understands and will follow disposition?: Yes           Copied from CRM #[id number]. Topic: Clinical - Red Word Triage >> May 11, 2024 12:15 PM Maureen Pena wrote: [facility] that prompted transfer to Nurse Triage: Patient is having the following symptoms:   Erratic BP readings (High) High Stress   Go over Recent Lung Cancer screening - Ct Scan **Transf to NT** Reason for Disposition  Systolic BP >= 160 OR Diastolic >= 100  Answer Assessment - Initial Assessment Questions 1. BLOOD PRESSURE: What is your blood pressure? Did you take at least two measurements 5 minutes apart?     Last night BP was 200/115. Current reading: 164/100.  2. ONSET: When did you take your blood pressure?     Patient rechecked during triage 12:35pm.  3. HOW: How did you take your blood pressure? (e.g., automatic home BP monitor, visiting nurse)     Automatic home BP monitor.  4. HISTORY: Do you have a history of high blood pressure?     No, recent high blood pressure since end of October this year.  5. MEDICINES: Are you taking any medicines for blood pressure? Have you missed any doses recently?     No.  6. OTHER SYMPTOMS: Do you have any symptoms? (e.g., blurred vision, chest pain, difficulty breathing, headache, weakness)     She states she had a panic attack last night. Anxiety regarding the results of her CT lung cancer screening which she would like to discuss with her PCP. She states she has reached out to her psychiatrist regarding the panic attack and is requesting Valium from the  provider. Denies chest pain, difficulty breathing, unilateral numbness or weakness, changes in speech or vision. Headache,dull 3/10.  Protocols used: Blood Pressure - High-A-AH

## 2024-05-12 ENCOUNTER — Other Ambulatory Visit: Payer: Self-pay

## 2024-05-12 DIAGNOSIS — Z87891 Personal history of nicotine dependence: Secondary | ICD-10-CM

## 2024-05-12 DIAGNOSIS — R911 Solitary pulmonary nodule: Secondary | ICD-10-CM

## 2024-05-12 DIAGNOSIS — Z122 Encounter for screening for malignant neoplasm of respiratory organs: Secondary | ICD-10-CM

## 2024-05-12 NOTE — Telephone Encounter (Signed)
 Spoke with patient and reviewed recent Lung CT results. She has been scheduled for a 6 month follow up scan 5/20/52026 to evaluate nodule stability. Order placed. She is on statin therapy and sees Cardiology this month. Results and plan to PCP.

## 2024-05-12 NOTE — Telephone Encounter (Signed)
 See other telephone note from 05/11/24

## 2024-05-15 ENCOUNTER — Encounter: Payer: Self-pay | Admitting: Internal Medicine

## 2024-05-15 ENCOUNTER — Ambulatory Visit: Admitting: Internal Medicine

## 2024-05-15 VITALS — BP 148/82 | HR 66 | Ht 66.5 in | Wt 149.8 lb

## 2024-05-15 DIAGNOSIS — R030 Elevated blood-pressure reading, without diagnosis of hypertension: Secondary | ICD-10-CM | POA: Diagnosis not present

## 2024-05-15 DIAGNOSIS — Z87891 Personal history of nicotine dependence: Secondary | ICD-10-CM

## 2024-05-15 DIAGNOSIS — J439 Emphysema, unspecified: Secondary | ICD-10-CM | POA: Insufficient documentation

## 2024-05-15 DIAGNOSIS — R918 Other nonspecific abnormal finding of lung field: Secondary | ICD-10-CM | POA: Insufficient documentation

## 2024-05-15 DIAGNOSIS — D751 Secondary polycythemia: Secondary | ICD-10-CM

## 2024-05-15 NOTE — Patient Instructions (Signed)
 YOUR BLOOD PRESSURE IS SLIGHTLY HIGH TODAY  It should be  130/80 or less.  Please check your blood pressure a few times at home and send me the readings   If there is protein in your urine I will recommend starting a different medication than the amlodipine we talked about today   REFERRAL TO DR TAMEA AT [facility] PULMONOLOGY IN PROCESS I HAVE ALSO ORDERED THE PULMONARY FUNCTION TESTS TO STREAMLINE THE PROCESS IN CASE THE APPT IS FAR OUT.

## 2024-05-15 NOTE — Assessment & Plan Note (Signed)
 Noted on lung cancer screening CT.  She has a 30 pack yr history of tobacco abuse and qut Nov 2025 .  She is asymptomatic with long walks .  Referring for PFTs and pulmonary evaluation.

## 2024-05-15 NOTE — Assessment & Plan Note (Signed)
 Home readings have not been done correctly (arm at side).  Checking UaCr today; which is  normal,  will start amlodipine if home readings are elevated going forward.

## 2024-05-15 NOTE — Assessment & Plan Note (Signed)
 She quit again in November 2025 and is currently taking chantix 

## 2024-05-15 NOTE — Assessment & Plan Note (Addendum)
 Currently resolved likely secondary to tobacco abuse.    Lab Results  Component Value Date   WBC 8.0 04/09/2024   HGB 14.5 04/09/2024   HCT 44.2 04/09/2024   MCV 91.6 04/09/2024   PLT 218.0 04/09/2024

## 2024-05-15 NOTE — Assessment & Plan Note (Signed)
 Referring to pulmonary for follow up

## 2024-05-15 NOTE — Progress Notes (Signed)
 Subjective:  Patient ID: Maureen Pena, female    DOB: [birth year]  Age: 68 y.o. MRN: [id number]  CC: The primary encounter diagnosis was Elevated blood pressure, situational. Diagnoses of Emphysema of lung (HCC), Multiple pulmonary nodules, History of tobacco abuse, Elevated blood pressure reading, and Polycythemia were also pertinent to this visit. Maureen Pena is a 68 yr old   HPI Maureen Pena presents for  Chief Complaint  Patient presents with   Medical Management of Chronic Issues   Maureen Pena is a 68 yr old female with a history of GAD, hypothyroidism, CAD s/p Late presenting STEMI, who presents with several new issues:    1) HTN:  bp has been elevated  at home  for the last several weeks using an electronic machine and again today in office . First noted in March during her annual dermatology appointment . Does not use NSAIDS,    or excessive caffeine.  Readings have ranged from 145/85 to 135/85 at home but has been checking with arm at side.     2) Recent Chest CT positive for lung nodules (multiple),   emphysema and aortic atherosclerosis 6 month follow up CT has been advised.  Maureen Pena  She walks 1-2 miles daily with her 2 large dogs and does not become short of breath with exertion.  Quit smoking a moth ago,  was tobacco free for a year before Trump was elected,  then resumed in Nov 2024 Does not have a daily productive cough ,  but coughs 1-2  times daily,  dry .  HAD RSV VACCINE IN NOVEMBER AT [facility]     Outpatient Medications Prior to Visit  Medication Sig Dispense Refill   aspirin  81 MG EC tablet Take 1 tablet (81 mg total) by mouth daily. 90 tablet 3   Cholecalciferol (VITAMIN D -3) 5000 units TABS Take 4,000 Units by mouth. (Patient taking differently: Take 5,000 Units by mouth.)     escitalopram  (LEXAPRO ) 5 MG tablet TAKE 1 TABLET BY MOUTH DAILY 90 tablet 3   ezetimibe  (ZETIA ) 10 MG tablet TAKE 1 TABLET BY MOUTH DAILY **NEED OFFICE VISIT FOR FURTHER REFILLS** 90 tablet 3    levothyroxine  (SYNTHROID ) 100 MCG tablet Take 1 30 minutes before meal in the am qd 90 tablet 3   nitroGLYCERIN  (NITROSTAT ) 0.4 MG SL tablet Place 1 tablet (0.4 mg total) under the tongue every 5 (five) minutes x 3 doses as needed for chest pain. 30 tablet 2   rosuvastatin  (CRESTOR ) 20 MG tablet TAKE 1 TABLET BY MOUTH DAILY 90 tablet 3   No facility-administered medications prior to visit.    Review of Systems;  Patient denies headache, fevers, malaise, unintentional weight loss, skin rash, eye pain, sinus congestion and sinus pain, sore throat, dysphagia,  hemoptysis , cough, dyspnea, wheezing, chest pain, palpitations, orthopnea, edema, abdominal pain, nausea, melena, diarrhea, constipation, flank pain, dysuria, hematuria, urinary  Frequency, nocturia, numbness, tingling, seizures,  Focal weakness, Loss of consciousness,  Tremor, insomnia, depression, anxiety, and suicidal ideation.      Objective:  BP (!) 148/82   Pulse 66   Ht 5' 6.5 (1.689 m)   Wt 149 lb 12.8 oz (67.9 kg)   SpO2 96%   BMI 23.82 kg/m   BP Readings from Last 3 Encounters:  05/15/24 (!) 148/82  04/09/24 120/70  12/18/23 118/82    Wt Readings from Last 3 Encounters:  05/15/24 149 lb 12.8 oz (67.9 kg)  04/09/24 148 lb 6.4 oz (67.3 kg)  12/18/23 154 lb (69.9  kg)    Physical Exam  Lab Results  Component Value Date   HGBA1C 5.4 04/09/2024   HGBA1C 5.4 10/12/2021    Lab Results  Component Value Date   CREATININE 0.85 04/09/2024   CREATININE 0.96 01/17/2023   CREATININE 0.92 10/12/2021    Lab Results  Component Value Date   WBC 8.0 04/09/2024   HGB 14.5 04/09/2024   HCT 44.2 04/09/2024   PLT 218.0 04/09/2024   GLUCOSE 74 04/09/2024   CHOL 128 04/09/2024   TRIG 77.0 04/09/2024   HDL 61.70 04/09/2024   LDLCALC 51 04/09/2024   ALT 14 04/09/2024   AST 13 04/09/2024   NA 141 04/09/2024   K 5.0 04/09/2024   CL 105 04/09/2024   CREATININE 0.85 04/09/2024   BUN 15 04/09/2024   CO2 27 04/09/2024    TSH 0.42 04/09/2024   INR 0.9 08/19/2019   HGBA1C 5.4 04/09/2024    CT CHEST LUNG CA SCREEN LOW DOSE W/O CM Result Date: 05/08/2024 CLINICAL DATA:  Former 30 pack-year smoker, quit earlier this month. EXAM: CT CHEST WITHOUT CONTRAST LOW-DOSE FOR LUNG CANCER SCREENING TECHNIQUE: Multidetector CT imaging of the chest was performed following the standard protocol without IV contrast. RADIATION DOSE REDUCTION: This exam was performed according to the departmental dose-optimization program which includes automated exposure control, adjustment of the mA and/or kV according to patient size and/or use of iterative reconstruction technique. COMPARISON:  None Available. FINDINGS: Cardiovascular: Atherosclerotic calcification of the aorta. Heart size normal. No pericardial effusion. Mediastinum/Nodes: No pathologically enlarged mediastinal or axillary lymph nodes. Hilar regions are difficult to definitively evaluate without IV contrast. Esophagus is grossly unremarkable. Lungs/Pleura: Centrilobular and paraseptal emphysema. Smoking related respiratory bronchiolitis. Calcified granulomas. 6.9 mm left lower lobe nodule (3/156) and 6.5 mm lateral right upper lobe nodule (3/140). 9.1 mm nodule along the right major fissure. Additional pulmonary nodules measure 5.2 mm or less in size. No pleural fluid. Airway is unremarkable. Upper Abdomen: Partially imaged low-attenuation mass in the left kidney. No specific follow-up necessary. Visualized portions of the liver, gallbladder, adrenal glands, kidneys, spleen, pancreas, stomach and bowel are otherwise grossly unremarkable. No upper abdominal adenopathy. Musculoskeletal: Degenerative changes in the spine. IMPRESSION: 1. Pulmonary nodules measure up to 6.9 mm in the left lower lobe. Lung-RADS 3, probably benign findings. Short-term follow-up in 6 months is recommended with repeat low-dose chest CT without contrast (please use the following order, CT CHEST LCS NODULE  FOLLOW-UP W/O CM). These results will be called to the ordering clinician or representative by the Radiologist Assistant, and communication documented in the PACS or [employer]. 2.  Aortic atherosclerosis (ICD10-I70.0). 3.  Emphysema (ICD10-J43.9). Electronically Signed   By: Newell Eke M.D.   On: 05/08/2024 15:57    Assessment & Plan:  .Elevated blood pressure, situational -     Microalbumin / creatinine urine ratio  Emphysema of lung (HCC) Assessment & Plan: Noted on lung cancer screening CT.  She has a 30 pack yr history of tobacco abuse and qut Nov 2025 .  She is asymptomatic with long walks .  Referring for PFTs and pulmonary evaluation.   Orders: -     Pulmonary Visit -     Pulmonary Function Test [facility] Only; Future  Multiple pulmonary nodules Assessment & Plan: Referring to pulmonary for follow up  Orders: -     Pulmonary Visit  History of tobacco abuse Assessment & Plan: She quit again in November 2025 and is currently taking chantix   Elevated blood pressure reading Assessment & Plan: Home readings have not been done correctly (arm at side).  Checking UaCr today; if normal,  will start amlodipine if home readings are elevated going forward.  If positive,  will start benicar    Polycythemia Assessment & Plan: Currently resolved likely secondary to tobacco abuse.    Lab Results  Component Value Date   WBC 8.0 04/09/2024   HGB 14.5 04/09/2024   HCT 44.2 04/09/2024   MCV 91.6 04/09/2024   PLT 218.0 04/09/2024      I personally spent a total of 40 minutes in the care of the patient today including preparing to see the patient, getting/reviewing separately obtained history, performing a medically appropriate exam/evaluation, counseling and educating, placing orders, referring and communicating with other health care professionals, documenting clinical information in the EHR, independently interpreting results, and communicating results. therapeutics with  patient  .   Follow-up: No follow-ups on file.   Verneita LITTIE Kettering, MD

## 2024-05-16 ENCOUNTER — Ambulatory Visit: Payer: Self-pay | Admitting: Internal Medicine

## 2024-05-16 LAB — MICROALBUMIN / CREATININE URINE RATIO
Creatinine, Urine: 58 mg/dL (ref 20–275)
Microalb Creat Ratio: 5 mg/g{creat} (ref <30)
Microalb, Ur: 0.3 mg/dL

## 2024-05-19 ENCOUNTER — Other Ambulatory Visit: Payer: Self-pay

## 2024-05-19 DIAGNOSIS — F17210 Nicotine dependence, cigarettes, uncomplicated: Secondary | ICD-10-CM

## 2024-05-19 MED ORDER — VARENICLINE TARTRATE 1 MG PO TABS: 1.0000 mg | ORAL_TABLET | Freq: Two times a day (BID) | ORAL | 0 refills | Status: AC

## 2024-05-19 NOTE — Telephone Encounter (Signed)
 Received a fax from patient's pharmacy stating Patient has completed starter dosing- can you send over new Rx for Chantix  1 mg 1 BID for continuation? Please advise as the blue sticky note states that Dr Coby is prescribing the Chantix . Please advise?

## 2024-05-19 NOTE — Telephone Encounter (Signed)
 1. Continuous dependence on cigarette smoking - Refill appropriate, continue Chantix  1 mg BID.  - varenicline  (CHANTIX  CONTINUING MONTH PAK) 1 MG tablet; Take 1 tablet (1 mg total) by mouth 2 (two) times daily.  Dispense: 180 tablet; Refill: 0  Luke Shade, MD

## 2024-05-21 ENCOUNTER — Other Ambulatory Visit: Payer: Self-pay

## 2024-05-21 ENCOUNTER — Encounter: Payer: Self-pay | Admitting: Psychiatry

## 2024-05-21 ENCOUNTER — Ambulatory Visit: Admitting: Psychiatry

## 2024-05-21 VITALS — BP 130/88 | HR 72 | Temp 97.6°F | Ht 66.5 in | Wt 150.4 lb

## 2024-05-21 DIAGNOSIS — F418 Other specified anxiety disorders: Secondary | ICD-10-CM | POA: Insufficient documentation

## 2024-05-21 DIAGNOSIS — F3342 Major depressive disorder, recurrent, in full remission: Secondary | ICD-10-CM | POA: Diagnosis not present

## 2024-05-21 DIAGNOSIS — F17200 Nicotine dependence, unspecified, uncomplicated: Secondary | ICD-10-CM

## 2024-05-21 MED ORDER — DIAZEPAM 5 MG PO TABS: 5.0000 mg | ORAL_TABLET | Freq: Every day | ORAL | 0 refills | Status: AC | PRN

## 2024-05-21 NOTE — Progress Notes (Unsigned)
 [facility] MD OP Progress Note  05/21/2024 3:43 PM Maureen Pena  MRN:  [id number]  Chief Complaint:  Chief Complaint  Patient presents with   Follow-up   Medication Refill   Anxiety   Discussed the use of AI scribe software for clinical note transcription with the patient, who gave verbal consent to proceed.  History of Present Illness Maureen Pena is a 68 year old Caucasian female, divorced, retired, lives in [place], has a history of MDD, tobacco use disorder, hypothyroidism was evaluated in office today for follow-up appointment.  After receiving news of a new emphysema diagnosis from her primary care provider, she experienced a significant increase in anxiety and a panic attack. She describes the panic attack as involving a pounding heart, crawling skin, head pounding, restlessness, and difficulty sitting still, which lasted about 10 minutes before she used guided meditation and breathing exercises to calm herself. She notes that her anxiety persisted into the following day, with poor appetite and difficulty sleeping that night. Taking action, such as contacting her providers and scheduling appointments, helped her anxiety subside. She also channels her anxious energy into cleaning her house and staying physically active.  With a history of depressive episodes, she describes feeling as though she was 'circling the drain' and at risk for another episode during this recent period of heightened anxiety. She typically recognizes the onset of depressive episodes by a complete loss of appetite, which she experienced for about 24 hours during this episode. Since meeting with her providers and taking steps to address her health concerns, she reports improvement in mood and anxiety.  Several psychosocial stressors contribute to her anxiety, including the new emphysema diagnosis, elevated blood pressure readings, challenges with health insurance coverage and open enrollment, and difficulty  accessing her established providers. She expresses frustration with the lack of clear information from her health care team and insurance company, which she feels exacerbated her anxiety.  She currently takes Lexapro  and requests a refill. She also takes Chantix , which she reports is working well and has helped her quit smoking as of mid-last month. She describes previous benefit from hydroxyzine  for anxiety and sleep but expresses concern about its impact on blood pressure. She denies current sleep difficulties and states that her sleep has returned to normal since the acute episode.  She is interested in trial of short-term course of Valium if she ever has another panic attacks.  Denies any suicidality, homicidality or perceptual disturbances.  She lives alone with two dogs. She regularly walks 1 to 2 miles daily and performs physical work around the house and yard. She enjoys deep cleaning and plans to spend more time having fun and engaging in activities.      Visit Diagnosis:    ICD-10-CM   1. MDD (major depressive disorder), recurrent, in full remission  F33.42     2. Other specified anxiety disorders  F41.8 diazepam (VALIUM) 5 MG tablet   with limited symptom attacks    3. Tobacco use disorder  F17.200    In remission      Past Psychiatric History: I have reviewed past psychiatric history from progress note on 02/06/2018.  Past trials of Lexapro , hydroxyzine .  Past Medical History:  Past Medical History:  Diagnosis Date   Anxiety    Depression    Diverticulitis    History of chicken pox    STEMI (ST elevation myocardial infarction) (HCC) 08/19/2019   Thyroid  disease    hypothyroidism   UTI (urinary tract infection)    Welcome  to Medicare preventive visit 05/03/2023    Past Surgical History:  Procedure Laterality Date   ESOPHAGOGASTRODUODENOSCOPY (EGD) WITH PROPOFOL  N/A 05/02/2018   Procedure: ESOPHAGOGASTRODUODENOSCOPY (EGD) WITH PROPOFOL ;  Surgeon: Jinny Carmine, MD;   [facility];  Service: Endoscopy;  Laterality: N/A;   LEFT HEART CATH AND CORONARY ANGIOGRAPHY N/A 08/20/2019   Procedure: LEFT HEART CATH AND CORONARY ANGIOGRAPHY;  Surgeon: Perla Evalene PARAS, MD;  Location: [facility];  Service: Cardiovascular;  Laterality: N/A;   TUBAL LIGATION      Family Psychiatric History: Reviewed family psychiatric history from progress note on 02/06/2018.  Family History:  Family History  Problem Relation Age of Onset   Depression Mother    Anxiety disorder Mother    Schizophrenia Mother    Paranoid behavior Mother    Stroke Mother    COPD Father    Early death Father    Hearing loss Father    Heart disease Father    CAD Father        s/p cabg x 3    Depression Brother    COPD Brother    Hypertension Brother    Kidney disease Brother    Pulmonary fibrosis Brother        died 50 24-Aug-2020   Breast cancer Neg Hx     Social History: I have reviewed social history from progress note on 02/06/2018. Social History   Socioeconomic History   Marital status: Divorced    Spouse name: Not on file   Number of children: 2   Years of education: Not on file   Highest education level: Professional school degree (e.g., MD, DDS, DVM, JD)  Occupational History    Comment: part time  Tobacco Use   Smoking status: Former    Types: Cigarettes   Smokeless tobacco: Never  Vaping Use   Vaping status: Never Used  Substance and Sexual Activity   Alcohol use: Not Currently    Alcohol/week: 1.0 standard drink of alcohol    Types: 1 Glasses of wine per week   Drug use: Yes    Types: Marijuana   Sexual activity: Not Currently  Other Topics Concern   Not on file  Social History Narrative   Kids    No guns    Wears seat belt    Safe in relationship    Smoker    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 dogs and 1 cat   Lawyer    Divorced    Social Drivers of Health   Tobacco Use: Medium Risk (05/21/2024)   Patient History    Smoking Tobacco Use: Former     Smokeless Tobacco Use: Never    Passive Exposure: Not on Actuary Strain: Low Risk (04/07/2024)   Overall Financial Resource Strain (CARDIA)    Difficulty of Paying Living Expenses: Not hard at all  Food Insecurity: No Food Insecurity (04/07/2024)   Epic    Worried About Radiation Protection Practitioner of Food in the Last Year: Never true    Ran Out of Food in the Last Year: Never true  Transportation Needs: No Transportation Needs (04/07/2024)   Epic    Lack of Transportation (Medical): No    Lack of Transportation (Non-Medical): No  Physical Activity: Sufficiently Active (04/07/2024)   Exercise Vital Sign    Days of Exercise per Week: 7 days    Minutes of Exercise per Session: 50 min  Stress: No Stress Concern Present (04/07/2024)   [employer] of Occupational Health -  Occupational Stress Questionnaire    Feeling of Stress: Only a little  Social Connections: Socially Isolated (04/07/2024)   Social Connection and Isolation Panel    Frequency of Communication with Friends and Family: Once a week    Frequency of Social Gatherings with Friends and Family: Once a week    Attends Religious Services: Never    Database Administrator or Organizations: No    Attends Engineer, Structural: Not on file    Marital Status: Divorced  Depression (PHQ2-9): Medium Risk (05/15/2024)   Depression (PHQ2-9)    PHQ-2 Score: 5  Alcohol Screen: Low Risk (04/07/2024)   Alcohol Screen    Last Alcohol Screening Score (AUDIT): 1  Housing: Low Risk (04/07/2024)   Epic    Unable to Pay for Housing in the Last Year: No    Number of Times Moved in the Last Year: 0    Homeless in the Last Year: No  Utilities: Not At Risk (04/26/2023)   AHC Utilities    Threatened with loss of utilities: No  Health Literacy: Adequate Health Literacy (04/26/2023)   B1300 Health Literacy    Frequency of need for help with medical instructions: Never    Allergies: Allergies[1]  Metabolic Disorder Labs: Lab  Results  Component Value Date   HGBA1C 5.4 04/09/2024   No results found for: PROLACTIN Lab Results  Component Value Date   CHOL 128 04/09/2024   TRIG 77.0 04/09/2024   HDL 61.70 04/09/2024   CHOLHDL 2 04/09/2024   VLDL 15.4 04/09/2024   LDLCALC 51 04/09/2024   LDLCALC 52 08/19/2023   Lab Results  Component Value Date   TSH 0.42 04/09/2024   TSH 1.00 01/17/2023    Therapeutic Level Labs: No results found for: LITHIUM No results found for: VALPROATE No results found for: CBMZ  Current Medications: Current Outpatient Medications  Medication Sig Dispense Refill   aspirin  81 MG EC tablet Take 1 tablet (81 mg total) by mouth daily. 90 tablet 3   Cholecalciferol (VITAMIN D -3) 5000 units TABS Take 4,000 Units by mouth. (Patient taking differently: Take 5,000 Units by mouth.)     diazepam (VALIUM) 5 MG tablet Take 1 tablet (5 mg total) by mouth daily as needed for anxiety. For severe anxiety attacks only 7 tablet 0   escitalopram  (LEXAPRO ) 5 MG tablet TAKE 1 TABLET BY MOUTH DAILY 90 tablet 3   ezetimibe  (ZETIA ) 10 MG tablet TAKE 1 TABLET BY MOUTH DAILY **NEED OFFICE VISIT FOR FURTHER REFILLS** 90 tablet 3   levothyroxine  (SYNTHROID ) 100 MCG tablet Take 1 30 minutes before meal in the am qd 90 tablet 3   nitroGLYCERIN  (NITROSTAT ) 0.4 MG SL tablet Place 1 tablet (0.4 mg total) under the tongue every 5 (five) minutes x 3 doses as needed for chest pain. 30 tablet 2   rosuvastatin  (CRESTOR ) 20 MG tablet TAKE 1 TABLET BY MOUTH DAILY 90 tablet 3   varenicline  (CHANTIX  CONTINUING MONTH PAK) 1 MG tablet Take 1 tablet (1 mg total) by mouth 2 (two) times daily. 180 tablet 0   No current facility-administered medications for this visit.     Musculoskeletal: Strength & Muscle Tone: within normal limits Gait & Station: normal Patient leans: N/A  Psychiatric Specialty Exam: Review of Systems  Psychiatric/Behavioral:  The patient is nervous/anxious.     Blood pressure 130/88,  pulse 72, temperature 97.6 F (36.4 C), temperature source Temporal, height 5' 6.5 (1.689 m), weight 150 lb 6.4 oz (68.2 kg).Body mass index  is 23.91 kg/m.  General Appearance: Casual  Eye Contact:  Fair  Speech:  Normal Rate  Volume:  Normal  Mood:  Anxious improving  Affect:  Congruent  Thought Process:  Goal Directed and Descriptions of Associations: Intact  Orientation:  Full (Time, Place, and Person)  Thought Content: Logical   Suicidal Thoughts:  No  Homicidal Thoughts:  No  Memory:  Immediate;   Fair Recent;   Fair Remote;   Fair  Judgement:  Fair  Insight:  Fair  Psychomotor Activity:  Normal  Concentration:  Concentration: Fair and Attention Span: Fair  Recall:  [employer] of Knowledge: Fair  Language: Fair  Akathisia:  No  Handed:  Right  AIMS (if indicated): not done  Assets:  Manufacturing Systems Engineer Desire for Improvement Housing Social Support Transportation  ADL's:  Intact  Cognition: WNL  Sleep:  Improving   Screenings: Geneticist, Molecular Office Visit from [facility]/15/2021 in [facility]  AIMS Total Score 0   GAD-7    Flowsheet Row Office Visit from 05/15/2024 in [facility] [employer] Visit from 04/09/2024 in [facility] [employer] Visit from 12/18/2023 in [facility] Office Visit from 05/15/2023 in [facility] Office Visit from 04/26/2023 in [facility] [employer]  Total GAD-7 Score 5 2 2  0 2   PHQ2-9    Flowsheet Row Office Visit from 05/15/2024 in [facility] Visit from 04/09/2024 in [facility] [employer] Visit from 12/18/2023 in [facility] Office Visit from 05/15/2023 in [facility] Office Visit from 04/26/2023 in [facility]  PHQ-2 Total Score 0 1 2 0 2  PHQ-9 Total Score 5 1 5  -- 2   Flowsheet Row Office Visit from 12/18/2023 in [facility] Office Visit from 05/15/2023 in [facility] Office Visit from 11/16/2022 in [facility]  C-SSRS RISK CATEGORY No Risk No Risk No Risk     Assessment and Plan: Baneza Bartoszek is a 68 year old Caucasian female who presented for follow-up appointment, discussed assessment and plan as noted below.  1. MDD (major depressive disorder), recurrent, in full remission Currently reports she does not have any significant depression symptoms Continue Lexapro  5 mg daily  2. Other specified anxiety disorders with limited symptom attacks-improving Recent health related scare to worsening anxiety and panic attacks.  However after a consultation with her provider and a specialist referral she currently feels much better. Continue Lexapro  5 mg daily Not interested in dosage increase at this time Start Valium 5 mg as needed for severe anxiety attacks only.  She is aware of long-term risk of being on benzodiazepine therapy and is aware she needs to limit use.Reviewed  PMP AWARxE   3. Tobacco use disorder in remission Quit smoking a month ago. Continue Chantix  0.5 mg twice a day  Reviewed and discussed all labs including TSH dated 04/10/2019 25-0.42-within normal limits, CMP-within normal limits, lipid panel-within normal limits CBC with differential-within normal limits, hemoglobin A1c-within normal limits  Follow-up Follow-up in clinic in 5 to 6 months or sooner if needed.    Consent: Patient/Guardian gives verbal consent for treatment and assignment of benefits for services provided during this visit.  Patient/Guardian expressed understanding and agreed to proceed.   This note was generated in part or whole with voice recognition software. Voice recognition is usually quite accurate but there are transcription errors that can and very often do occur. I apologize for any typographical errors that were not detected and corrected.   Call  Maryellen Barth, MD 05/21/2024, 3:43 PM     [1] No Known Allergies

## 2024-05-21 NOTE — Patient Instructions (Signed)
 Diazepam  Tablets What is this medication? DIAZEPAM  (dye [place] e pam) treats seizures, muscle spasms, or twitches. It is also used to treat anxiety, including before a procedure. It can be used to reduce the symptoms of alcohol withdrawal. It works by helping your nervous system calm down. It belongs to a group of medications called benzodiazepines. This medicine may be used for other purposes; ask your health care provider or pharmacist if you have questions. COMMON BRAND NAME(S): Valium  What should I tell my care team before I take this medication? They need to know if you have any of these conditions: Glaucoma Kidney disease Liver disease Lung or breathing disease, such as asthma or COPD Mental health conditions Myasthenia gravis Sleep apnea Substance use disorder Suicidal thoughts, plans, or attempt by you or a family member An unusual or allergic reaction to diazepam , other medications, foods, dyes, or preservatives Pregnant or trying to get pregnant Breastfeeding How should I use this medication? Take this medication by mouth. Take it as directed on the prescription label. You can take it with or without food. If it upsets your stomach, take it with food. Do not take it more often than directed. Keep taking it unless your care team tells you to stop. A special MedGuide will be given to you by the pharmacist with each prescription and refill. Be sure to read this information carefully each time. Talk to your care team about the use of this medication in children. Special care may be needed. People 65 years and older may have a stronger reaction and need a smaller dose. Overdosage: If you think you have taken too much of this medicine contact a poison control center or emergency room at once. NOTE: This medicine is only for you. Do not share this medicine with others. What if I miss a dose? If you miss a dose, take it as soon as you can. If it is almost time for your next dose, take only  that dose. Do not take double or extra doses. What may interact with this medication? Do not take this medication with any of the following: Sodium oxybate This medication may also interact with the following: Alcohol Grapefruit juice Medications that cause drowsiness before a procedure, such as propofol  Medications that help you fall asleep Medications that relax muscles Opioids for pain or cough Other benzodiazepines Phenothiazines, such as chlorpromazine, prochlorperazine, thioridazine Some antihistamines Some medications for depression, such as amitriptyline  or trazodone  Some medications for seizures, such as phenobarbital or primidone Supplements, such as green tea, melatonin, St. John's wort, valerian This medication may affect how other medications work, and other medications may affect the way this medication works. Talk with your care team about all the medications you take. They may suggest changes to your treatment plan to lower the risk of side effects and to make sure your medications work as intended. This list may not describe all possible interactions. Give your health care provider a list of all the medicines, herbs, non-prescription drugs, or dietary supplements you use. Also tell them if you smoke, drink alcohol, or use illegal drugs. Some items may interact with your medicine. What should I watch for while using this medication? Visit your care team for regular checks on your progress. Tell your care team if your symptoms do not start to get better or if they get worse. There is a risk of abuse, misuse, and addiction with this medication. It is important to take this medication as directed by your care team. This  medication may affect your coordination, reaction time, or judgment. Do not drive or operate machinery until you know how this medication affects you. Sit up or stand slowly to reduce the risk of dizzy or fainting spells. Drinking alcohol with this medication can  increase the risk of these side effects. This medication is a CNS depressant. This is a type of medication or substance that slows down your brain and nervous system. Taking it with other CNS depressants can make you too sleepy. This can make it hard to breathe and stay awake. In some cases, it can cause coma and death. CNS depressants include opioids, benzodiazepines, muscle relaxants, medications for sleep, alcohol, and street drugs. Talk to your care team about all the medications, vitamins, and supplements you take. They can tell you what is safe to take together. Call emergency services right away if you have slow or shallow breathing, feel dizzy or confused, or have trouble staying awake. Do not stop taking this medication or reduce your dose without first talking to your care team. If you have taken this medication for a long time or take a high dose, your body may rely on it. Stopping it suddenly may cause a severe reaction. Talk to your care team about how long you need to take this medication. When it is time to stop, the dose will be slowly lowered over time to reduce the risk of side effects. This medication may worsen depression and cause thoughts of suicide. This can happen at any time but is more common after first starting treatment and after a change in dose. Talk to your care team right away if you have changes in mood and behavior or thoughts of self-harm or suicide. They can help you. Talk to your care team if you may be pregnant. Prolonged use of this medication during pregnancy can cause temporary withdrawal in a newborn. Talk to your care team before breastfeeding. Changes to your treatment plan may be needed. If you breastfeed while taking this medication, seek medical care right away if you notice the child has slow or noisy breathing, is unusually sleepy or not able to wake up, or is limp. What side effects may I notice from receiving this medication? Side effects that you should  report to your care team as soon as possible: Allergic reactions--skin rash, itching, hives, swelling of the face, lips, tongue, or throat CNS depression--slow or shallow breathing, shortness of breath, feeling faint, dizziness, confusion, trouble staying awake Thoughts of suicide or self-harm, worsening mood, feelings of depression Side effects that usually do not require medical attention (report these to your care team if they continue or are bothersome): Dizziness Drowsiness Headache This list may not describe all possible side effects. Call your doctor for medical advice about side effects. You may report side effects to FDA at 1-800-FDA-1088. Where should I keep my medication? Keep out of the reach of children and pets. Store it out of sight in a safe place. Do not share it with others. Misuse of this medication is dangerous and against the law. Store at room temperature between 20 and 25 degrees C (68 and 77 degrees F). Protect from light. Keep container tightly closed. Get rid of any unused medication after the expiration date. This medication may cause harm and death if it is taken by other adults, children, or pets. It is important to get rid of the medication as soon as you no longer need it, or it is expired. To get rid of this  medication: Take the medication to a take-back program. Check with your pharmacy or law enforcement to find a location. Follow the steps given to you by your pharmacy. You may be given a pre-paid mail-back envelope or disposal product to safely get rid of your medication. If other options are not available, check the package insert or medication guide to see if it should be flushed down the toilet or put in your trash at home. If you are not sure, ask your care team. If it is safe to put it in your trash, empty the medication out of the container. Mix it with cat litter, dirt, used coffee grounds, or another unwanted substance. Seal the mixture in a container, such  as a plastic bag. Put it in the trash. NOTE: This sheet is a summary. It may not cover all possible information. If you have questions about this medicine, talk to your doctor, pharmacist, or health care provider.  2025 Elsevier/Gold Standard (2023-09-06 00:00:00)

## 2024-06-07 NOTE — Progress Notes (Unsigned)
 Cardiology Office Note  Date:  06/09/2024   ID:  Maureen Pena, DOB Aug 08, 1955, MRN [id number]  PCP:  Abbey Bruckner, MD   Chief Complaint  Patient presents with   12 month follow up     Patient denies any cardiac concerns.     HPI:  68 year old woman with history of  smoking, quit 2/24 hyperlipidemia,  strong family history of coronary disease  Late presenting STEMI, initial event August 16, 2019 Severe single-vessel disease proximal to mid left circumflex after OM1 takeoff Collaterals noted medical management was recommended Who presents for follow up of her CAD  Last office visit with myself November 2024 Restarted smoking in 2024 Stopped smoking the past month or so  Chronic mild SOB on exertion  Concerned about CT scan finding showing emphysema, aortic atherosclerosis Images pulled up and reviewed, minimal aortic atherosclerosis in the arch noted  In the past blood pressure has been low, well-controlled More recently blood pressure running high typically 140 systolic or higher  Weight trending downward  Tolerating Crestor , Zetia  Cholesterol at goal  Labs reviewed  On  lipitor  20 mg daily/zetia  10 mg daily Total chol 128 LDL 51 A1c 5.4  EKG personally reviewed by myself on todays visit EKG Interpretation Date/Time:  Tuesday June 09 2024 16:04:29 EST Ventricular Rate:  72 PR Interval:  164 QRS Duration:  88 QT Interval:  400 QTC Calculation: 438 R Axis:   72  Text Interpretation: Normal sinus rhythm Nonspecific T wave abnormality When compared with ECG of 07-May-2023 08:45, No significant change was found Confirmed by Perla Lye [phone]) on 06/09/2024 4:26:08 PM   Other past medical history reviewed previously tried Chantix  Had not tried other modalities  chest pain August 16, 2019 developed 15 minutes of chest pain radiating through to her back between her scapulas.   did not seek out assistance, Seen by primary care March 10, lab work  performed showing elevated D-dimer, elevated troponin  presented to the emergency room, initial EKG concerning for late presenting inferior wall STEMI.     PMH:   has a past medical history of Anxiety, Depression, Diverticulitis, History of chicken pox, STEMI (ST elevation myocardial infarction) (HCC) (08/19/2019), Thyroid  disease, UTI (urinary tract infection), and Welcome to Medicare preventive visit (05/03/2023).  PSH:    Past Surgical History:  Procedure Laterality Date   ESOPHAGOGASTRODUODENOSCOPY (EGD) WITH PROPOFOL  N/A 05/02/2018   Procedure: ESOPHAGOGASTRODUODENOSCOPY (EGD) WITH PROPOFOL ;  Surgeon: Jinny Carmine, MD;  Location[facility];  Service: Endoscopy;  Laterality: N/A;   LEFT HEART CATH AND CORONARY ANGIOGRAPHY N/A 08/20/2019   Procedure: LEFT HEART CATH AND CORONARY ANGIOGRAPHY;  Surgeon: Perla Lye PARAS, MD;  Location: [facility];  Service: Cardiovascular;  Laterality: N/A;   TUBAL LIGATION      Current Outpatient Medications  Medication Sig Dispense Refill   aspirin  81 MG EC tablet Take 1 tablet (81 mg total) by mouth daily. 90 tablet 3   Cholecalciferol (VITAMIN D -3) 125 MCG (5000 UT) TABS Take 5,000 Units by mouth daily.     diazepam  (VALIUM ) 5 MG tablet Take 1 tablet (5 mg total) by mouth daily as needed for anxiety. For severe anxiety attacks only 7 tablet 0   escitalopram  (LEXAPRO ) 5 MG tablet TAKE 1 TABLET BY MOUTH DAILY 90 tablet 3   ezetimibe  (ZETIA ) 10 MG tablet TAKE 1 TABLET BY MOUTH DAILY **NEED OFFICE VISIT FOR FURTHER REFILLS** 90 tablet 3   levothyroxine  (SYNTHROID ) 100 MCG tablet Take 1 30 minutes before  meal in the am qd 90 tablet 3   nitroGLYCERIN  (NITROSTAT ) 0.4 MG SL tablet Place 1 tablet (0.4 mg total) under the tongue every 5 (five) minutes x 3 doses as needed for chest pain. 30 tablet 2   rosuvastatin  (CRESTOR ) 20 MG tablet TAKE 1 TABLET BY MOUTH DAILY 90 tablet 3   varenicline  (CHANTIX  CONTINUING MONTH PAK) 1 MG tablet Take 1  tablet (1 mg total) by mouth 2 (two) times daily. 180 tablet 0   No current facility-administered medications for this visit.    Allergies:   Patient has no known allergies.   Social History:  The patient  reports that she has quit smoking. Her smoking use included cigarettes. She has never used smokeless tobacco. She reports that she does not currently use alcohol after a past usage of about 1.0 standard drink of alcohol per week. She reports current drug use. Drug: Marijuana.   Family History:   family history includes Anxiety disorder in her mother; CAD in her father; COPD in her brother and father; Depression in her brother and mother; Early death in her father; Hearing loss in her father; Heart disease in her father; Hypertension in her brother; Kidney disease in her brother; Paranoid behavior in her mother; Pulmonary fibrosis in her brother; Schizophrenia in her mother; Stroke in her mother.    Review of Systems: Review of Systems  Constitutional: Negative.   HENT: Negative.    Respiratory: Negative.    Cardiovascular: Negative.   Gastrointestinal: Negative.   Musculoskeletal: Negative.   Neurological: Negative.   Psychiatric/Behavioral: Negative.    All other systems reviewed and are negative.   PHYSICAL EXAM: VS:  BP (!) 140/90 (BP Location: Left Arm, Patient Position: Sitting, Cuff Size: Normal)   Pulse 72   Ht 5' 6.5 (1.689 m)   Wt 152 lb (68.9 kg)   SpO2 93%   BMI 24.17 kg/m  , BMI Body mass index is 24.17 kg/m. Constitutional:  oriented to person, place, and time. No distress.  HENT:  Head: Grossly normal Eyes:  no discharge. No scleral icterus.  Neck: No JVD, no carotid bruits  Cardiovascular: Regular rate and rhythm, no murmurs appreciated Pulmonary/Chest: Clear to auscultation bilaterally, no wheezes or rails Abdominal: Soft.  no distension.  no tenderness.  Musculoskeletal: Normal range of motion Neurological:  normal muscle tone. Coordination normal. No  atrophy Skin: Skin warm and dry Psychiatric: normal affect, pleasant  Recent Labs: 04/09/2024: ALT 14; BUN 15; Creatinine, Ser 0.85; Hemoglobin 14.5; Platelets 218.0; Potassium 5.0; Sodium 141; TSH 0.42    Lipid Panel Lab Results  Component Value Date   CHOL 128 04/09/2024   HDL 61.70 04/09/2024   LDLCALC 51 04/09/2024   TRIG 77.0 04/09/2024    Wt Readings from Last 3 Encounters:  06/09/24 152 lb (68.9 kg)  05/15/24 149 lb 12.8 oz (67.9 kg)  04/09/24 148 lb 6.4 oz (67.3 kg)     ASSESSMENT AND PLAN:  Problem List Items Addressed This Visit       Cardiology Problems   Hyperlipidemia   Coronary artery disease involving native coronary artery of native heart without angina pectoris - Primary   Relevant Orders   EKG 12-Lead (Completed)     Other   Elevated blood pressure reading   Relevant Orders   EKG 12-Lead (Completed)   History of tobacco abuse   Emphysema of lung (HCC)    Coronary disease with stable angina Currently with no symptoms of angina. No further workup at  this time. Continue current medication regimen.  Smoker Short smoking relapse, reports that she has stopped again  Emphysema on CT scan Denies significant COPD exacerbations, no wheezing Smoking cessation recommended  Hyperlipidemia Cholesterol at goal, continue Crestor  20 daily with Zetia  10, weight trending downward  Hypothyroid followed by PMD  Essential hypertension Blood pressure mildly elevated, recommend she start amlodipine 2.5 daily   Signed, Tim Shalandria Elsbernd, M.D., Ph.D. [facility] [place], [place] [phone]

## 2024-06-08 ENCOUNTER — Ambulatory Visit
Admission: RE | Admit: 2024-06-08 | Discharge: 2024-06-08 | Disposition: A | Discharge status: Home / Self Care | Source: Ambulatory Visit

## 2024-06-08 DIAGNOSIS — Z1231 Encounter for screening mammogram for malignant neoplasm of breast: Secondary | ICD-10-CM | POA: Diagnosis present

## 2024-06-09 ENCOUNTER — Encounter: Payer: Self-pay | Admitting: Cardiovascular Disease

## 2024-06-09 ENCOUNTER — Ambulatory Visit: Attending: Cardiovascular Disease | Admitting: Cardiovascular Disease

## 2024-06-09 ENCOUNTER — Other Ambulatory Visit: Payer: Self-pay | Admitting: Internal Medicine

## 2024-06-09 VITALS — BP 140/90 | HR 72 | Ht 66.5 in | Wt 152.0 lb

## 2024-06-09 DIAGNOSIS — I10 Essential (primary) hypertension: Secondary | ICD-10-CM

## 2024-06-09 DIAGNOSIS — J439 Emphysema, unspecified: Secondary | ICD-10-CM | POA: Diagnosis not present

## 2024-06-09 DIAGNOSIS — E782 Mixed hyperlipidemia: Secondary | ICD-10-CM

## 2024-06-09 DIAGNOSIS — I25118 Atherosclerotic heart disease of native coronary artery with other forms of angina pectoris: Secondary | ICD-10-CM | POA: Diagnosis not present

## 2024-06-09 DIAGNOSIS — E785 Hyperlipidemia, unspecified: Secondary | ICD-10-CM

## 2024-06-09 DIAGNOSIS — I2510 Atherosclerotic heart disease of native coronary artery without angina pectoris: Secondary | ICD-10-CM | POA: Diagnosis not present

## 2024-06-09 DIAGNOSIS — Z87891 Personal history of nicotine dependence: Secondary | ICD-10-CM | POA: Diagnosis not present

## 2024-06-09 DIAGNOSIS — R030 Elevated blood-pressure reading, without diagnosis of hypertension: Secondary | ICD-10-CM

## 2024-06-09 MED ORDER — AMLODIPINE BESYLATE 2.5 MG PO TABS: 2.5000 mg | ORAL_TABLET | Freq: Every day | ORAL | 3 refills | Status: AC

## 2024-06-09 MED ORDER — EZETIMIBE 10 MG PO TABS: 10.0000 mg | ORAL_TABLET | Freq: Every day | ORAL | 3 refills | Status: AC

## 2024-06-09 MED ORDER — ROSUVASTATIN CALCIUM 20 MG PO TABS: 20.0000 mg | ORAL_TABLET | Freq: Every day | ORAL | 3 refills | Status: AC

## 2024-06-09 NOTE — Assessment & Plan Note (Signed)
 Confirmed with elevated home readings. . Starting amlodipine   Lab Results  Component Value Date   LABMICR Comment 03/10/2018   MICROALBUR 0.3 05/15/2024

## 2024-06-09 NOTE — Patient Instructions (Addendum)
 Medication Instructions:  Please start amlodipine 2.5 mg daily  If you need a refill on your cardiac medications before your next appointment, please call your pharmacy.   Lab work: No new labs needed  Testing/Procedures: No new testing needed  Follow-Up: At [facility], you and your health needs are our priority.  As part of our continuing mission to provide you with exceptional heart care, we have created designated Provider Care Teams.  These Care Teams include your primary Cardiologist (physician) and Advanced Practice Providers (APPs -  Physician Assistants and Nurse Practitioners) who all work together to provide you with the care you need, when you need it.  You will need a follow up appointment in 12 months  Providers on your designated Care Team:   Lonni Meager, NP Bernardino Bring, PA-C Cadence [place], [place]  COVID-19 Vaccine Information can be found at: [email or web address] For questions related to vaccine distribution or appointments, please email [email or web address] or call [phone].

## 2024-06-16 ENCOUNTER — Ambulatory Visit: Admitting: Pulmonary Disease

## 2024-06-16 ENCOUNTER — Encounter: Payer: Self-pay | Admitting: Pulmonary Disease

## 2024-06-16 VITALS — BP 102/78 | HR 74 | Temp 98.1°F | Ht 66.5 in | Wt 151.6 lb

## 2024-06-16 DIAGNOSIS — F17210 Nicotine dependence, cigarettes, uncomplicated: Secondary | ICD-10-CM

## 2024-06-16 DIAGNOSIS — J84115 Respiratory bronchiolitis interstitial lung disease: Secondary | ICD-10-CM

## 2024-06-16 DIAGNOSIS — F17211 Nicotine dependence, cigarettes, in remission: Secondary | ICD-10-CM | POA: Diagnosis not present

## 2024-06-16 DIAGNOSIS — R0609 Other forms of dyspnea: Secondary | ICD-10-CM

## 2024-06-16 DIAGNOSIS — J439 Emphysema, unspecified: Secondary | ICD-10-CM

## 2024-06-16 DIAGNOSIS — R918 Other nonspecific abnormal finding of lung field: Secondary | ICD-10-CM

## 2024-06-16 NOTE — Patient Instructions (Signed)
 VISIT SUMMARY:  Today, we discussed your recent lung cancer screening results, which showed emphysema and lung nodules. We also reviewed your concerns about elevated blood pressure and your family history of lung disease. You have quit smoking and are currently taking Chantix  to help with smoking cessation.  YOUR PLAN:  -PULMONARY EMPHYSEMA: Emphysema is a lung condition that causes shortness of breath due to damage to the air sacs in the lungs, often related to smoking. We will conduct breathing tests and a swab screen for hereditary emphysema. If the swab screen is positive, a blood test for alpha-1 antitrypsin deficiency will be ordered. Continue your efforts to quit smoking.  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 Symptoms should improve as you continue to avoid smoking.  -LUNG NODULES: Lung nodules are small growths in the lungs, often related to smoking. We will monitor these nodules with a follow-up CT scan in six months.  -NICOTINE DEPENDENCE, CIGARETTES: Nicotine dependence is an addiction to tobacco products. You have quit smoking and are taking Chantix  to help with this. Continue using Chantix  and avoid smoking to improve your lung health.  INSTRUCTIONS:  Please follow up with the breathing tests (PFTs) and swab screen for hereditary emphysema as ordered. Continue with your smoking cessation efforts and take Chantix  as prescribed. We will schedule a repeat CT scan in six months to monitor the lung nodules.

## 2024-06-16 NOTE — Progress Notes (Unsigned)
 "  Subjective:    Patient ID: Maureen Pena, female    DOB: 1955/11/29, 70 y.o.   MRN: [id number]  Patient Care Team: Bair, Kalpana, MD as PCP - General (Family Medicine) Perla Evalene PARAS, MD as Consulting Physician (Cardiology) Eappen, Saramma, MD as Consulting Physician (Psychiatry) Ruthell Lauraine FALCON, NP as Nurse Practitioner (Pulmonary Disease)  Chief Complaint  Patient presents with   Consult    CT scan done on 04/30/2024. Occasional shortness of breath on exertion.     BACKGROUND:   HPI    Review of Systems A 10 point review of systems was performed and it is as noted above otherwise negative.   Past Medical History:  Diagnosis Date   Anxiety    Depression    Diverticulitis    History of chicken pox    STEMI (ST elevation myocardial infarction) (HCC) 08/19/2019   Thyroid  disease    hypothyroidism   UTI (urinary tract infection)    Welcome to Medicare preventive visit 05/03/2023    Past Surgical History:  Procedure Laterality Date   ESOPHAGOGASTRODUODENOSCOPY (EGD) WITH PROPOFOL  N/A 05/02/2018   Procedure: ESOPHAGOGASTRODUODENOSCOPY (EGD) WITH PROPOFOL ;  Surgeon: Jinny Carmine, MD;  [facility]: [facility];  Service: Endoscopy;  Laterality: N/A;   LEFT HEART CATH AND CORONARY ANGIOGRAPHY N/A 08/20/2019   Procedure: LEFT HEART CATH AND CORONARY ANGIOGRAPHY;  Surgeon: Perla Evalene PARAS, MD;  Location: [facility];  Service: Cardiovascular;  Laterality: N/A;   TUBAL LIGATION      Patient Active Problem List   Diagnosis Date Noted   Other specified anxiety disorders 05/21/2024   Multiple pulmonary nodules 05/15/2024   Emphysema of lung (HCC) 05/15/2024   Polycythemia 04/09/2024   History of tobacco abuse 04/09/2024   Age-related osteoporosis without current pathological fracture 04/09/2024   Essential hypertension 04/09/2024   High risk medication use 05/15/2023   Encounter for screening examination for intermediate hyperglycemia and  diabetes mellitus 05/03/2023   Screening for lung cancer 02/09/2023   Postmenopausal estrogen deficiency 02/09/2023   MDD (major depressive disorder), recurrent, in full remission 08/31/2019   Tobacco use disorder 08/31/2019   Coronary artery disease involving native coronary artery of native heart without angina pectoris 08/20/2019   Hiatal hernia 08/19/2019   Hyperlipidemia 08/19/2019   Annual physical exam 04/11/2018   Mood disorder 02/17/2018   Gastroesophageal reflux disease without esophagitis 03/07/2017   Hypothyroidism 03/07/2017    Family History  Problem Relation Age of Onset   Depression Mother    Anxiety disorder Mother    Schizophrenia Mother    Paranoid behavior Mother    Stroke Mother    COPD Father    Early death Father    Hearing loss Father    Heart disease Father    CAD Father        s/p cabg x 3    Depression Brother    COPD Brother    Hypertension Brother    Kidney disease Brother    Pulmonary fibrosis Brother        died 40 25-Jul-2020   Breast cancer Neg Hx     Social History   Tobacco Use   Smoking status: Former    Current packs/day: 0.00    Average packs/day: 0.5 packs/day for 51.9 years (24.5 ttl pk-yrs)    Types: Cigarettes    Start date: 21    Quit date: 04/28/2024    Years since quitting: 0.1   Smokeless tobacco: Never   Tobacco comments:  Smoked on and off   Substance Use Topics   Alcohol use: Not Currently    Alcohol/week: 1.0 standard drink of alcohol    Types: 1 Glasses of wine per week    Allergies[1]  Active Medications[2]  Immunization History  Administered Date(s) Administered    sv, Bivalent, Protein Subunit Rsvpref,pf (Abrysvo) 04/13/2024   INFLUENZA, HIGH DOSE SEASONAL PF 03/07/2023, 02/27/2024   Influenza,inj,Quad PF,6+ Mos 04/11/2018, 04/20/2019, 07/08/2020, 02/14/2022   Influenza-Unspecified 03/21/2021, 03/07/2023   MMR 08/23/2023   PFIZER Comirnaty(Gray Top)Covid-19 Tri-Sucrose Vaccine 12/26/2020    PFIZER(Purple Top)SARS-COV-2 Vaccination 08/28/2019, 09/23/2019, 04/11/2020, 03/07/2023   PNEUMOCOCCAL CONJUGATE-20 04/26/2023   Pfizer Covid-19 Vaccine Bivalent Booster 40yrs & up 03/21/2021   Pfizer(Comirnaty)Fall Seasonal Vaccine 12 years and older 03/05/2022, 03/07/2023, 02/27/2024   Pneumococcal Conjugate-13 04/14/2021   Tdap 03/07/2009, 03/08/2020   Zoster Recombinant(Shingrix ) 05/11/2021, 08/11/2021        Objective:     Vitals:   06/16/24 1258  BP: 102/78  Pulse: 74  Temp: 98.1 F (36.7 C)  Height: 5' 6.5 (1.689 m)  Weight: 151 lb 9.6 oz (68.8 kg)  SpO2: 97%  TempSrc: Temporal  BMI (Calculated): 24.11     SpO2: 97 %  GENERAL: HEAD: Normocephalic, atraumatic.  EYES: Pupils equal, round, reactive to light.  No scleral icterus.  MOUTH:  NECK: Supple. No thyromegaly. Trachea midline. No JVD.  No adenopathy. PULMONARY: Good air entry bilaterally.  No adventitious sounds. CARDIOVASCULAR: S1 and S2. Regular rate and rhythm.  ABDOMEN: MUSCULOSKELETAL: No joint deformity, no clubbing, no edema.  NEUROLOGIC:  SKIN: Intact,warm,dry. PSYCH:        Assessment & Plan:     ICD-10-CM   1. Lung nodules  R91.8     2. 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 Pulmonary emphysema (HCC)  J43.9     4. Tobacco dependence due to cigarettes  F17.210       No orders of the defined types were placed in this encounter.   No orders of the defined types were placed in this encounter.    Advised if symptoms do not improve or worsen, to please contact office for sooner follow up or seek emergency care.    I spent xxx minutes of dedicated to the care of this patient on the date of this encounter to include pre-visit review of records, face-to-face time with the patient discussing conditions above, post visit ordering of testing, clinical documentation with the electronic health record, making appropriate referrals as documented,  and communicating necessary findings to members of the patients care team.   C. Leita Sanders, MD Advanced Bronchoscopy P[facility]    *This note was dictated using voice recognition software/Dragon.  Despite best efforts to proofread, errors can occur which can change the meaning. Any transcriptional errors that result from this process are unintentional and may not be fully corrected at the time of dictation.    [1] No Known Allergies [2]  Current Meds  Medication Sig   amLODipine  (NORVASC ) 2.5 MG tablet Take 1 tablet (2.5 mg total) by mouth daily.   aspirin  81 MG EC tablet Take 1 tablet (81 mg total) by mouth daily.   Cholecalciferol (VITAMIN D -3) 125 MCG (5000 UT) TABS Take 5,000 Units by mouth daily.   diazepam  (VALIUM ) 5 MG tablet Take 1 tablet (5 mg total) by mouth daily as needed for anxiety. For severe anxiety attacks only   escitalopram  (LEXAPRO ) 5 MG tablet TAKE 1 TABLET  BY MOUTH DAILY   ezetimibe  (ZETIA ) 10 MG tablet Take 1 tablet (10 mg total) by mouth daily.   levothyroxine  (SYNTHROID ) 100 MCG tablet Take 1 30 minutes before meal in the am qd   nitroGLYCERIN  (NITROSTAT ) 0.4 MG SL tablet Place 1 tablet (0.4 mg total) under the tongue every 5 (five) minutes x 3 doses as needed for chest pain.   rosuvastatin  (CRESTOR ) 20 MG tablet Take 1 tablet (20 mg total) by mouth daily.   varenicline  (CHANTIX  CONTINUING MONTH PAK) 1 MG tablet Take 1 tablet (1 mg total) by mouth 2 (two) times daily.   "

## 2024-06-17 ENCOUNTER — Ambulatory Visit: Admitting: Psychiatry

## 2024-06-17 ENCOUNTER — Encounter: Payer: Self-pay | Admitting: Pulmonary Disease

## 2024-06-17 ENCOUNTER — Encounter: Admitting: *Deleted

## 2024-06-17 NOTE — Progress Notes (Signed)
 This encounter was created in error - please disregard.  This encounter was created in error - please disregard.

## 2024-06-23 ENCOUNTER — Ambulatory Visit

## 2024-06-23 DIAGNOSIS — J439 Emphysema, unspecified: Secondary | ICD-10-CM

## 2024-06-23 DIAGNOSIS — J84115 Respiratory bronchiolitis interstitial lung disease: Secondary | ICD-10-CM

## 2024-06-23 LAB — PULMONARY FUNCTION TEST
DL/VA % pred: 77 %
DL/VA: 3.2 ml/min/mmHg/L
DLCO unc % pred: 81 %
DLCO unc: 17.19 ml/min/mmHg
FEF 25-75 Post: 2.23 L/s
FEF 25-75 Pre: 1.42 L/s
FEF2575-%Change-Post: 56 %
FEF2575-%Pred-Post: 101 %
FEF2575-%Pred-Pre: 65 %
FEV1-%Change-Post: 12 %
FEV1-%Pred-Post: 99 %
FEV1-%Pred-Pre: 88 %
FEV1-Post: 2.58 L
FEV1-Pre: 2.29 L
FEV1FVC-%Change-Post: 2 %
FEV1FVC-%Pred-Pre: 90 %
FEV6-%Change-Post: 10 %
FEV6-%Pred-Post: 109 %
FEV6-%Pred-Pre: 99 %
FEV6-Post: 3.59 L
FEV6-Pre: 3.26 L
FEV6FVC-%Change-Post: 0 %
FEV6FVC-%Pred-Post: 103 %
FEV6FVC-%Pred-Pre: 102 %
FVC-%Change-Post: 9 %
FVC-%Pred-Post: 106 %
FVC-%Pred-Pre: 96 %
FVC-Post: 3.61 L
FVC-Pre: 3.3 L
Post FEV1/FVC ratio: 71 %
Post FEV6/FVC ratio: 99 %
Pre FEV1/FVC ratio: 69 %
Pre FEV6/FVC Ratio: 99 %
RV % pred: 113 %
RV: 2.56 L
TLC % pred: 116 %
TLC: 6.33 L

## 2024-06-23 NOTE — Progress Notes (Signed)
 Full PFT completed today ? ?

## 2024-06-23 NOTE — Patient Instructions (Signed)
 Full PFT completed today ? ?

## 2024-06-29 ENCOUNTER — Telehealth: Payer: Self-pay

## 2024-06-29 NOTE — Telephone Encounter (Signed)
 Per Dr. Tamea- Normal Alpha-1 Genotype test.

## 2024-07-27 ENCOUNTER — Ambulatory Visit

## 2024-08-11 ENCOUNTER — Ambulatory Visit

## 2024-09-14 ENCOUNTER — Ambulatory Visit: Admitting: Pulmonary Disease

## 2024-09-28 ENCOUNTER — Ambulatory Visit: Admitting: Dermatology

## 2024-10-28 ENCOUNTER — Ambulatory Visit

## 2024-11-23 ENCOUNTER — Ambulatory Visit: Admitting: Psychiatry
